# Patient Record
Sex: Female | Born: 1950
Health system: Southern US, Community
[De-identification: ages and names within clinical notes are randomized; demographics above are authoritative.]

## PROBLEM LIST (undated history)

## (undated) DIAGNOSIS — E559 Vitamin D deficiency, unspecified: Secondary | ICD-10-CM

## (undated) DIAGNOSIS — M5126 Other intervertebral disc displacement, lumbar region: Secondary | ICD-10-CM

## (undated) DIAGNOSIS — K219 Gastro-esophageal reflux disease without esophagitis: Secondary | ICD-10-CM

## (undated) DIAGNOSIS — M1990 Unspecified osteoarthritis, unspecified site: Secondary | ICD-10-CM

## (undated) DIAGNOSIS — Z87442 Personal history of urinary calculi: Secondary | ICD-10-CM

## (undated) DIAGNOSIS — E7800 Pure hypercholesterolemia, unspecified: Secondary | ICD-10-CM

## (undated) DIAGNOSIS — R42 Dizziness and giddiness: Secondary | ICD-10-CM

## (undated) DIAGNOSIS — F341 Dysthymic disorder: Secondary | ICD-10-CM

## (undated) DIAGNOSIS — G43909 Migraine, unspecified, not intractable, without status migrainosus: Secondary | ICD-10-CM

## (undated) DIAGNOSIS — M19049 Primary osteoarthritis, unspecified hand: Secondary | ICD-10-CM

## (undated) DIAGNOSIS — M25572 Pain in left ankle and joints of left foot: Secondary | ICD-10-CM

## (undated) DIAGNOSIS — G4700 Insomnia, unspecified: Secondary | ICD-10-CM

## (undated) DIAGNOSIS — M810 Age-related osteoporosis without current pathological fracture: Secondary | ICD-10-CM

## (undated) DIAGNOSIS — F410 Panic disorder [episodic paroxysmal anxiety] without agoraphobia: Secondary | ICD-10-CM

## (undated) HISTORY — PX: TUBAL LIGATION: SHX77

## (undated) HISTORY — DX: Pure hypercholesterolemia, unspecified: E78.00

## (undated) HISTORY — DX: Other intervertebral disc displacement, lumbar region: M51.26

## (undated) HISTORY — DX: Vitamin D deficiency, unspecified: E55.9

## (undated) HISTORY — DX: Primary osteoarthritis, unspecified hand: M19.049

## (undated) HISTORY — DX: Migraine, unspecified, not intractable, without status migrainosus: G43.909

## (undated) HISTORY — PX: OTHER SURGICAL HISTORY: SHX169

## (undated) HISTORY — DX: Insomnia, unspecified: G47.00

## (undated) HISTORY — DX: Gastro-esophageal reflux disease without esophagitis: K21.9

## (undated) HISTORY — PX: TUMOR REMOVAL: SHX12

## (undated) HISTORY — PX: COSMETIC SURGERY: SHX468

## (undated) HISTORY — DX: Panic disorder (episodic paroxysmal anxiety): F41.0

## (undated) HISTORY — PX: EYE SURGERY: SHX253

## (undated) HISTORY — DX: Dysthymic disorder: F34.1

## (undated) HISTORY — PX: LITHOTRIPSY: SUR834

---

## 2007-06-27 ENCOUNTER — Ambulatory Visit: Payer: Self-pay | Admitting: Specialist

## 2010-05-03 ENCOUNTER — Emergency Department: Payer: Self-pay | Admitting: Emergency Medicine

## 2010-12-20 ENCOUNTER — Ambulatory Visit: Payer: Self-pay | Admitting: Gastroenterology

## 2015-11-12 LAB — HM PAP SMEAR

## 2016-01-25 DIAGNOSIS — Z1231 Encounter for screening mammogram for malignant neoplasm of breast: Secondary | ICD-10-CM | POA: Diagnosis not present

## 2016-01-25 LAB — HM MAMMOGRAPHY

## 2016-04-27 DIAGNOSIS — L820 Inflamed seborrheic keratosis: Secondary | ICD-10-CM | POA: Diagnosis not present

## 2016-04-27 DIAGNOSIS — D485 Neoplasm of uncertain behavior of skin: Secondary | ICD-10-CM | POA: Diagnosis not present

## 2016-05-31 DIAGNOSIS — J019 Acute sinusitis, unspecified: Secondary | ICD-10-CM | POA: Diagnosis not present

## 2016-05-31 DIAGNOSIS — R05 Cough: Secondary | ICD-10-CM | POA: Diagnosis not present

## 2016-05-31 DIAGNOSIS — B9689 Other specified bacterial agents as the cause of diseases classified elsewhere: Secondary | ICD-10-CM | POA: Diagnosis not present

## 2016-06-13 DIAGNOSIS — R0981 Nasal congestion: Secondary | ICD-10-CM | POA: Diagnosis not present

## 2016-06-13 DIAGNOSIS — R05 Cough: Secondary | ICD-10-CM | POA: Diagnosis not present

## 2016-06-30 DIAGNOSIS — J301 Allergic rhinitis due to pollen: Secondary | ICD-10-CM | POA: Diagnosis not present

## 2016-06-30 DIAGNOSIS — J019 Acute sinusitis, unspecified: Secondary | ICD-10-CM | POA: Diagnosis not present

## 2016-07-07 DIAGNOSIS — R51 Headache: Secondary | ICD-10-CM | POA: Diagnosis not present

## 2016-07-07 DIAGNOSIS — J310 Chronic rhinitis: Secondary | ICD-10-CM | POA: Diagnosis not present

## 2016-07-20 DIAGNOSIS — H40053 Ocular hypertension, bilateral: Secondary | ICD-10-CM | POA: Diagnosis not present

## 2016-08-04 DIAGNOSIS — G4700 Insomnia, unspecified: Secondary | ICD-10-CM | POA: Insufficient documentation

## 2016-08-04 DIAGNOSIS — E559 Vitamin D deficiency, unspecified: Secondary | ICD-10-CM

## 2016-08-04 DIAGNOSIS — F341 Dysthymic disorder: Secondary | ICD-10-CM

## 2016-08-04 DIAGNOSIS — E7800 Pure hypercholesterolemia, unspecified: Secondary | ICD-10-CM

## 2016-08-04 DIAGNOSIS — G43909 Migraine, unspecified, not intractable, without status migrainosus: Secondary | ICD-10-CM | POA: Insufficient documentation

## 2016-08-04 DIAGNOSIS — K219 Gastro-esophageal reflux disease without esophagitis: Secondary | ICD-10-CM

## 2016-08-04 DIAGNOSIS — M19049 Primary osteoarthritis, unspecified hand: Secondary | ICD-10-CM

## 2016-08-04 DIAGNOSIS — M1990 Unspecified osteoarthritis, unspecified site: Secondary | ICD-10-CM | POA: Insufficient documentation

## 2016-08-04 DIAGNOSIS — F410 Panic disorder [episodic paroxysmal anxiety] without agoraphobia: Secondary | ICD-10-CM

## 2016-08-05 ENCOUNTER — Ambulatory Visit
Admission: RE | Admit: 2016-08-05 | Discharge: 2016-08-05 | Disposition: A | Discharge status: Home / Self Care | Payer: PPO | Source: Ambulatory Visit | Attending: Family Medicine | Admitting: Family Medicine

## 2016-08-05 ENCOUNTER — Ambulatory Visit (INDEPENDENT_AMBULATORY_CARE_PROVIDER_SITE_OTHER): Payer: PPO | Admitting: Family Medicine

## 2016-08-05 ENCOUNTER — Telehealth: Payer: Self-pay

## 2016-08-05 ENCOUNTER — Encounter: Payer: Self-pay | Admitting: Family Medicine

## 2016-08-05 VITALS — BP 102/74 | HR 66 | Temp 98.7°F | Resp 16 | Wt 115.0 lb

## 2016-08-05 DIAGNOSIS — K219 Gastro-esophageal reflux disease without esophagitis: Secondary | ICD-10-CM | POA: Diagnosis not present

## 2016-08-05 DIAGNOSIS — R05 Cough: Secondary | ICD-10-CM

## 2016-08-05 DIAGNOSIS — G2581 Restless legs syndrome: Secondary | ICD-10-CM | POA: Insufficient documentation

## 2016-08-05 DIAGNOSIS — R0602 Shortness of breath: Secondary | ICD-10-CM | POA: Diagnosis not present

## 2016-08-05 DIAGNOSIS — F419 Anxiety disorder, unspecified: Secondary | ICD-10-CM | POA: Insufficient documentation

## 2016-08-05 DIAGNOSIS — R053 Chronic cough: Secondary | ICD-10-CM

## 2016-08-05 NOTE — Telephone Encounter (Signed)
Patient has been advised. KW 

## 2016-08-05 NOTE — Progress Notes (Signed)
Patient: Brooke Mcdowell Female    DOB: 20-Oct-1950   65 y.o.   MRN: [id number] Visit Date: 08/05/2016  Today's Provider: Vernie Murders, PA   Chief Complaint  Patient presents with  . URI   Subjective:    URI   The current episode started more than 1 month ago. The problem has been unchanged. Associated symptoms include congestion, coughing (green sputum), headaches, a plugged ear sensation (bilateral; left worse than right), sinus pain, sneezing and a sore throat. Pertinent negatives include no abdominal pain, chest pain, ear pain, nausea, rhinorrhea, swollen glands, vomiting or wheezing. Treatments tried: Pt has been on cefdinir, 2 rounds of Levaquin, Virtussin cough syrup, Bromphen/pseudo/dextro cough syrup, and Mucinex. Was seen by [facility] walk in clinic and ENT for this. The treatment provided no relief.  Pt is requesting CXR today.  Past Medical History:  Diagnosis Date  . Arthropathy of hand   . Dysthymic disorder   . Esophageal reflux   . Insomnia   . Migraine   . Panic disorder   . Pure hypercholesterolemia   . Vitamin D deficiency    Past Surgical History:  Procedure Laterality Date  . clot removal from right sinus after surgery to remove tumor in 1995    . LITHOTRIPSY  [phone]   renal stones  . TUBAL LIGATION    . TUMOR REMOVAL     behind right ete in sinuses- 1995 Dr. Pryor Ochoa   Family History  Problem Relation Age of Onset  . Brain cancer Mother     Allergies  Allergen Reactions  . Tetracyclines & Related Itching and Nausea And Vomiting  . Penicillins Nausea And Vomiting and Rash  . Sulfa Antibiotics Rash     Current Outpatient Prescriptions:  .  ALPRAZolam (XANAX) 0.25 MG tablet, TK 1 T PO QD PRF ANXIETY OR SLEEP, Disp: , Rfl: 3 .  estradiol (ESTRACE) 1 MG tablet, TK 1 T PO QD, Disp: , Rfl: 8 .  rizatriptan (MAXALT) 10 MG tablet, , Disp: , Rfl: 3 .  sertraline (ZOLOFT) 50 MG tablet, , Disp: , Rfl: 2 .  zolpidem (AMBIEN) 10 MG tablet, TK 1 T  PO QHS PRF SLP, Disp: , Rfl: 3 .  FLUAD 0.5 ML SUSY, ADM 0.5ML IM UTD, Disp: , Rfl: 0  Review of Systems  HENT: Positive for congestion, sneezing and sore throat. Negative for ear pain and rhinorrhea.   Respiratory: Positive for cough (green sputum). Negative for wheezing.   Cardiovascular: Negative for chest pain.  Gastrointestinal: Negative for abdominal pain, nausea and vomiting.  Neurological: Positive for headaches.    Social History  Substance Use Topics  . Smoking status: Never Smoker  . Smokeless tobacco: Never Used  . Alcohol use Yes     Comment: rarely   Objective:   BP 102/74 (BP Location: Right Arm, Patient Position: Sitting, Cuff Size: Normal)   Pulse 66   Temp 98.7 F (37.1 C) (Oral)   Resp 16   Wt 115 lb (52.2 kg)   SpO2 98%   Physical Exam  Constitutional: She is oriented to person, place, and time. She appears well-developed and well-nourished. No distress.  HENT:  Head: Normocephalic and atraumatic.  Right Ear: Hearing and external ear normal.  Left Ear: Hearing and external ear normal.  Nose: Nose normal.  Eyes: Conjunctivae and lids are normal. Right eye exhibits no discharge. Left eye exhibits no discharge. No scleral icterus.  Neck: Neck supple.  Cardiovascular: Normal  rate and regular rhythm.   Pulmonary/Chest: Effort normal and breath sounds normal. No respiratory distress. She has no wheezes. She has no rales.  Abdominal: Soft. Bowel sounds are normal.  Musculoskeletal: Normal range of motion.  Neurological: She is alert and oriented to person, place, and time.  Skin: Skin is intact. No lesion and no rash noted.  Psychiatric: She has a normal mood and affect. Her speech is normal and behavior is normal. Thought content normal.      Assessment & Plan:     1. Persistent cough Started with sinus congestion. Was treated on 05-31-16 with Levaquin for 10 days. Was rechecked at a walk-in clinic since she had no improvement 06-13-16 and was given 10 more  days of Levaquin with addition of a prednisone taper. Felt she was not improving and went to Dr. Richardson Landry (ENT) with fever and greenish sputum production. States a CT scan of sinuses in early October was negative and allergy testing ([facility]ST) was negative. Was advised to take [facility] but has continued to have a cough. Worried about lung cancer. Recommend CXR, use Mucinex and start Zantac for possible reflux irritation. Recheck pending reports. - DG Chest 2 View - CBC with Differential/Platelet - Comprehensive metabolic panel  2. Gastroesophageal reflux disease, esophagitis presence not specified Has had problems with reflux in the past but no dyspepsia, melena or hematemesis recently. Will check labs and encouraged to use Zantac 150 mg BID for a month. May need referral to GI and possible endoscopy if no better. - CBC with Differential/Platelet - Comprehensive metabolic panel       Vernie Murders, PA  [facility]

## 2016-08-05 NOTE — Telephone Encounter (Signed)
-----   Message from Margo Common, [place] sent at 08/05/2016 12:19 PM EDT ----- Normal CXR. No sign of infection, cancer or heart disease. Proceed with Zantac 150 mg BID and Mucinex-DM BID. Recheck lungs in 2 weeks to see if she needs referral for upper endoscopy.

## 2016-08-06 LAB — COMPREHENSIVE METABOLIC PANEL
A/G RATIO: 1.7 (ref 1.2–2.2)
ALT: 12 IU/L (ref 0–32)
AST: 17 IU/L (ref 0–40)
Albumin: 4.3 g/dL (ref 3.6–4.8)
Alkaline Phosphatase: 57 IU/L (ref 39–117)
BUN/Creatinine Ratio: 16 (ref 12–28)
BUN: 13 mg/dL (ref 8–27)
Bilirubin Total: 0.4 mg/dL (ref 0.0–1.2)
CO2: 25 mmol/L (ref 18–29)
CREATININE: 0.79 mg/dL (ref 0.57–1.00)
Calcium: 9.5 mg/dL (ref 8.7–10.3)
Chloride: 103 mmol/L (ref 96–106)
GFR, EST AFRICAN AMERICAN: 90 mL/min/{1.73_m2} (ref >59)
GFR, EST NON AFRICAN AMERICAN: 78 mL/min/{1.73_m2} (ref >59)
GLUCOSE: 91 mg/dL (ref 65–99)
Globulin, Total: 2.5 g/dL (ref 1.5–4.5)
POTASSIUM: 5.1 mmol/L (ref 3.5–5.2)
Sodium: 140 mmol/L (ref 134–144)
TOTAL PROTEIN: 6.8 g/dL (ref 6.0–8.5)

## 2016-08-06 LAB — CBC WITH DIFFERENTIAL/PLATELET
BASOS ABS: 0 10*3/uL (ref 0.0–0.2)
Basos: 0 %
EOS (ABSOLUTE): 0 10*3/uL (ref 0.0–0.4)
Eos: 1 %
HEMOGLOBIN: 13.2 g/dL (ref 11.1–15.9)
Hematocrit: 38.7 % (ref 34.0–46.6)
Immature Grans (Abs): 0 10*3/uL (ref 0.0–0.1)
Immature Granulocytes: 0 %
LYMPHS ABS: 1.7 10*3/uL (ref 0.7–3.1)
Lymphs: 32 %
MCH: 31 pg (ref 26.6–33.0)
MCHC: 34.1 g/dL (ref 31.5–35.7)
MCV: 91 fL (ref 79–97)
MONOCYTES: 10 %
MONOS ABS: 0.5 10*3/uL (ref 0.1–0.9)
NEUTROS ABS: 3 10*3/uL (ref 1.4–7.0)
Neutrophils: 57 %
Platelets: 264 10*3/uL (ref 150–379)
RBC: 4.26 x10E6/uL (ref 3.77–5.28)
RDW: 12.9 % (ref 12.3–15.4)
WBC: 5.2 10*3/uL (ref 3.4–10.8)

## 2016-08-08 ENCOUNTER — Telehealth: Payer: Self-pay

## 2016-08-08 NOTE — Telephone Encounter (Signed)
Patient has been advised an appt set up for 12/1

## 2016-08-08 NOTE — Telephone Encounter (Signed)
-----   Message from Margo Common, [place] sent at 08/08/2016  9:57 AM EST ----- Normal chest x-ray report without any infection or sign of fluid collection. Blood tests are all normal. Continue Zantac 150 mg BID for 4 weeks and schedule follow up appointment if no better.

## 2016-08-19 ENCOUNTER — Ambulatory Visit: Payer: PPO | Admitting: Family Medicine

## 2016-08-19 ENCOUNTER — Ambulatory Visit: Payer: Self-pay | Admitting: Family Medicine

## 2016-09-02 ENCOUNTER — Ambulatory Visit: Payer: Self-pay | Admitting: Family Medicine

## 2016-09-19 DIAGNOSIS — D225 Melanocytic nevi of trunk: Secondary | ICD-10-CM | POA: Diagnosis not present

## 2016-09-19 DIAGNOSIS — Z85828 Personal history of other malignant neoplasm of skin: Secondary | ICD-10-CM | POA: Diagnosis not present

## 2016-09-19 DIAGNOSIS — L814 Other melanin hyperpigmentation: Secondary | ICD-10-CM | POA: Diagnosis not present

## 2016-09-19 DIAGNOSIS — L821 Other seborrheic keratosis: Secondary | ICD-10-CM | POA: Diagnosis not present

## 2016-10-11 DIAGNOSIS — M5440 Lumbago with sciatica, unspecified side: Secondary | ICD-10-CM | POA: Diagnosis not present

## 2016-10-11 DIAGNOSIS — M5136 Other intervertebral disc degeneration, lumbar region: Secondary | ICD-10-CM | POA: Diagnosis not present

## 2016-10-13 DIAGNOSIS — M5442 Lumbago with sciatica, left side: Secondary | ICD-10-CM | POA: Diagnosis not present

## 2016-10-13 DIAGNOSIS — G8929 Other chronic pain: Secondary | ICD-10-CM | POA: Diagnosis not present

## 2016-10-13 DIAGNOSIS — M6281 Muscle weakness (generalized): Secondary | ICD-10-CM | POA: Diagnosis not present

## 2016-10-13 DIAGNOSIS — M5441 Lumbago with sciatica, right side: Secondary | ICD-10-CM | POA: Diagnosis not present

## 2016-10-17 DIAGNOSIS — M5442 Lumbago with sciatica, left side: Secondary | ICD-10-CM | POA: Diagnosis not present

## 2016-10-17 DIAGNOSIS — G8929 Other chronic pain: Secondary | ICD-10-CM | POA: Diagnosis not present

## 2016-10-17 DIAGNOSIS — M6281 Muscle weakness (generalized): Secondary | ICD-10-CM | POA: Diagnosis not present

## 2016-10-17 DIAGNOSIS — M5441 Lumbago with sciatica, right side: Secondary | ICD-10-CM | POA: Diagnosis not present

## 2016-10-21 DIAGNOSIS — G8929 Other chronic pain: Secondary | ICD-10-CM | POA: Diagnosis not present

## 2016-10-21 DIAGNOSIS — M6281 Muscle weakness (generalized): Secondary | ICD-10-CM | POA: Diagnosis not present

## 2016-10-21 DIAGNOSIS — M5442 Lumbago with sciatica, left side: Secondary | ICD-10-CM | POA: Diagnosis not present

## 2016-10-21 DIAGNOSIS — M5441 Lumbago with sciatica, right side: Secondary | ICD-10-CM | POA: Diagnosis not present

## 2016-10-24 DIAGNOSIS — M6281 Muscle weakness (generalized): Secondary | ICD-10-CM | POA: Diagnosis not present

## 2016-10-24 DIAGNOSIS — G8929 Other chronic pain: Secondary | ICD-10-CM | POA: Diagnosis not present

## 2016-10-24 DIAGNOSIS — M5441 Lumbago with sciatica, right side: Secondary | ICD-10-CM | POA: Diagnosis not present

## 2016-10-24 DIAGNOSIS — M5442 Lumbago with sciatica, left side: Secondary | ICD-10-CM | POA: Diagnosis not present

## 2016-10-28 DIAGNOSIS — M5442 Lumbago with sciatica, left side: Secondary | ICD-10-CM | POA: Diagnosis not present

## 2016-10-28 DIAGNOSIS — M6281 Muscle weakness (generalized): Secondary | ICD-10-CM | POA: Diagnosis not present

## 2016-10-28 DIAGNOSIS — M5441 Lumbago with sciatica, right side: Secondary | ICD-10-CM | POA: Diagnosis not present

## 2016-10-28 DIAGNOSIS — G8929 Other chronic pain: Secondary | ICD-10-CM | POA: Diagnosis not present

## 2016-11-01 DIAGNOSIS — M5136 Other intervertebral disc degeneration, lumbar region: Secondary | ICD-10-CM | POA: Diagnosis not present

## 2016-11-01 DIAGNOSIS — M5440 Lumbago with sciatica, unspecified side: Secondary | ICD-10-CM | POA: Diagnosis not present

## 2016-11-29 DIAGNOSIS — Z1231 Encounter for screening mammogram for malignant neoplasm of breast: Secondary | ICD-10-CM | POA: Diagnosis not present

## 2016-11-29 DIAGNOSIS — Z124 Encounter for screening for malignant neoplasm of cervix: Secondary | ICD-10-CM | POA: Diagnosis not present

## 2016-12-01 ENCOUNTER — Other Ambulatory Visit: Payer: Self-pay | Admitting: Unknown Physician Specialty

## 2016-12-01 DIAGNOSIS — M545 Low back pain: Principal | ICD-10-CM

## 2016-12-01 DIAGNOSIS — G8929 Other chronic pain: Secondary | ICD-10-CM

## 2016-12-14 ENCOUNTER — Ambulatory Visit
Admission: RE | Admit: 2016-12-14 | Discharge: 2016-12-14 | Disposition: A | Discharge status: Home / Self Care | Payer: PPO | Source: Ambulatory Visit | Attending: Unknown Physician Specialty | Admitting: Unknown Physician Specialty

## 2016-12-14 ENCOUNTER — Ambulatory Visit: Payer: PPO

## 2016-12-14 DIAGNOSIS — M545 Low back pain, unspecified: Secondary | ICD-10-CM

## 2016-12-14 DIAGNOSIS — M5126 Other intervertebral disc displacement, lumbar region: Secondary | ICD-10-CM | POA: Insufficient documentation

## 2016-12-14 DIAGNOSIS — G8929 Other chronic pain: Secondary | ICD-10-CM | POA: Insufficient documentation

## 2016-12-14 DIAGNOSIS — M48061 Spinal stenosis, lumbar region without neurogenic claudication: Secondary | ICD-10-CM | POA: Diagnosis not present

## 2016-12-26 ENCOUNTER — Ambulatory Visit: Payer: PPO

## 2017-01-12 DIAGNOSIS — M48062 Spinal stenosis, lumbar region with neurogenic claudication: Secondary | ICD-10-CM | POA: Diagnosis not present

## 2017-01-12 DIAGNOSIS — M5136 Other intervertebral disc degeneration, lumbar region: Secondary | ICD-10-CM | POA: Diagnosis not present

## 2017-01-12 DIAGNOSIS — M5416 Radiculopathy, lumbar region: Secondary | ICD-10-CM | POA: Diagnosis not present

## 2017-02-14 DIAGNOSIS — M5136 Other intervertebral disc degeneration, lumbar region: Secondary | ICD-10-CM | POA: Diagnosis not present

## 2017-02-14 DIAGNOSIS — M48062 Spinal stenosis, lumbar region with neurogenic claudication: Secondary | ICD-10-CM | POA: Diagnosis not present

## 2017-02-14 DIAGNOSIS — M5416 Radiculopathy, lumbar region: Secondary | ICD-10-CM | POA: Diagnosis not present

## 2017-04-11 DIAGNOSIS — M47816 Spondylosis without myelopathy or radiculopathy, lumbar region: Secondary | ICD-10-CM | POA: Diagnosis not present

## 2017-04-25 ENCOUNTER — Encounter: Payer: Self-pay | Admitting: Physician Assistant

## 2017-04-25 ENCOUNTER — Ambulatory Visit (INDEPENDENT_AMBULATORY_CARE_PROVIDER_SITE_OTHER): Payer: PPO | Admitting: Physician Assistant

## 2017-04-25 VITALS — BP 116/78 | HR 80 | Temp 98.8°F | Resp 16 | Wt 112.0 lb

## 2017-04-25 DIAGNOSIS — J019 Acute sinusitis, unspecified: Secondary | ICD-10-CM | POA: Diagnosis not present

## 2017-04-25 MED ORDER — CEFDINIR 300 MG PO CAPS: 300.0000 mg | ORAL_CAPSULE | Freq: Two times a day (BID) | ORAL | 0 refills | Status: AC

## 2017-04-25 NOTE — Progress Notes (Signed)
Patient: Brooke Mcdowell Female    DOB: [birth year]   66 y.o.   MRN: [id number] Visit Date: 04/25/2017  Today's Provider: Trinna Post, PA-C   Chief Complaint  Patient presents with  . Sinusitis    Started about three weeks ago.   Subjective:      Brooke Mcdowell is a 66 y/o woman with history of recurrent sinusitis and 2 sinuplasty surgeries presenting today with sinus pain and congestion + PND drip x 3 weeks. Pain located around her nasal bridge, she has a headache and pressure. Having a lot of hardened nasal secretions. Had some URI symptoms a few weeks previously. Low grade temp. No trouble breathing, wheezing. She has allergy listed to PCN but tolerated cefidnir 2 years prior.  She is also asking if she can be seen here for her physicals. She had her wellness exams done by OBGYN but she is now 57 and done with PAPs. She is on estrace and progesterone for HRT, takes Xanax PRN, takes Ambien for sleep after failing melatonin and trazadone. Takes Maxalt PRN for migraines.  Sinusitis  This is a new problem. The current episode started 1 to 4 weeks ago. The problem has been gradually worsening since onset. Maximum temperature: Pt reports "A low grade fever of 99.2 and 99.4. Associated symptoms include congestion, coughing, headaches, sinus pressure, sneezing and a sore throat. Pertinent negatives include no chills, diaphoresis, ear pain, neck pain, shortness of breath or swollen glands. Past treatments include acetaminophen and oral decongestants. The treatment provided no relief.       Allergies  Allergen Reactions  . Tetracyclines & Related Itching and Nausea And Vomiting  . Penicillins Nausea And Vomiting and Rash  . Sulfa Antibiotics Rash     Current Outpatient Prescriptions:  .  ALPRAZolam (XANAX) 0.25 MG tablet, TK 1 T PO QD PRF ANXIETY OR SLEEP, Disp: , Rfl: 3 .  estradiol (ESTRACE) 1 MG tablet, TK 1 T PO QD, Disp: , Rfl: 8 .  rizatriptan (MAXALT) 10 MG tablet, ,  Disp: , Rfl: 3 .  sertraline (ZOLOFT) 50 MG tablet, , Disp: , Rfl: 2 .  zolpidem (AMBIEN) 10 MG tablet, TK 1 T PO QHS PRF SLP, Disp: , Rfl: 3 .  BOOSTRIX 5-2.5-18.5 injection, ADM 0.5ML IM UTD, Disp: , Rfl: 0  Review of Systems  Constitutional: Positive for activity change and fatigue. Negative for appetite change, chills, diaphoresis, fever and unexpected weight change.  HENT: Positive for congestion, nosebleeds, postnasal drip, sinus pain, sinus pressure, sneezing and sore throat. Negative for ear discharge, ear pain, hearing loss, rhinorrhea, tinnitus, trouble swallowing and voice change.   Eyes: Positive for pain and discharge. Negative for photophobia, redness, itching and visual disturbance.  Respiratory: Positive for cough and chest tightness. Negative for apnea, choking, shortness of breath, wheezing and stridor.   Gastrointestinal: Positive for abdominal pain and constipation. Negative for abdominal distention, anal bleeding, blood in stool, diarrhea, nausea, rectal pain and vomiting.  Musculoskeletal: Negative for neck pain and neck stiffness.  Neurological: Positive for headaches. Negative for dizziness and light-headedness.    Social History  Substance Use Topics  . Smoking status: Never Smoker  . Smokeless tobacco: Never Used  . Alcohol use Yes     Comment: rarely   Objective:   BP 116/78 (BP Location: Right Arm, Patient Position: Sitting, Cuff Size: Normal)   Pulse 80   Temp 98.8 F (37.1 C) (Oral)   Resp 16  Wt 112 lb (50.8 kg)   SpO2 95%  Vitals:   04/25/17 1541  BP: 116/78  Pulse: 80  Resp: 16  Temp: 98.8 F (37.1 C)  TempSrc: Oral  SpO2: 95%  Weight: 112 lb (50.8 kg)     Physical Exam  Constitutional: She is oriented to person, place, and time. She appears well-developed and well-nourished. No distress.  HENT:  Right Ear: External ear normal.  Left Ear: External ear normal.  Nose: Right sinus exhibits maxillary sinus tenderness and frontal sinus  tenderness. Left sinus exhibits maxillary sinus tenderness and frontal sinus tenderness.  Mouth/Throat: Oropharynx is clear and moist. No oropharyngeal exudate, posterior oropharyngeal edema or posterior oropharyngeal erythema.  Tms opaque bilaterally   Eyes: Conjunctivae are normal. Right eye exhibits no discharge. Left eye exhibits no discharge.  Neck: Neck supple.  Cardiovascular: Normal rate and regular rhythm.   Pulmonary/Chest: Effort normal and breath sounds normal. No respiratory distress. She has no wheezes. She has no rales.  Lymphadenopathy:    She has cervical adenopathy.  Neurological: She is alert and oriented to person, place, and time.  Skin: Skin is warm and dry. She is not diaphoretic.  Psychiatric: She has a normal mood and affect. Her behavior is normal.        Assessment & Plan:     1. Acute sinusitis, recurrence not specified, unspecified location  Let patient know it would be fine for her to be seen here for her physicals.  - cefdinir (OMNICEF) 300 MG capsule; Take 1 capsule (300 mg total) by mouth 2 (two) times daily.  Dispense: 20 capsule; Refill: 0  Return if symptoms worsen or fail to improve.  The entirety of the information documented in the History of Present Illness, Review of Systems and Physical Exam were personally obtained by me. Portions of this information were initially documented by Ashley Royalty, CMA and reviewed by me for thoroughness and accuracy.        Trinna Post, PA-C  [facility]

## 2017-04-25 NOTE — Patient Instructions (Signed)

## 2017-05-22 DIAGNOSIS — M47816 Spondylosis without myelopathy or radiculopathy, lumbar region: Secondary | ICD-10-CM | POA: Diagnosis not present

## 2017-06-13 ENCOUNTER — Ambulatory Visit (INDEPENDENT_AMBULATORY_CARE_PROVIDER_SITE_OTHER): Payer: PPO

## 2017-06-13 VITALS — BP 112/72 | HR 64 | Temp 98.8°F | Ht 66.0 in | Wt 114.0 lb

## 2017-06-13 DIAGNOSIS — Z23 Encounter for immunization: Secondary | ICD-10-CM

## 2017-06-13 DIAGNOSIS — Z0000 Encounter for general adult medical examination without abnormal findings: Secondary | ICD-10-CM | POA: Diagnosis not present

## 2017-06-13 NOTE — Progress Notes (Signed)
Subjective:   Brooke Mcdowell is a 66 y.o. female who presents for Medicare Annual (Subsequent) preventive examination.  Review of Systems:  N/A  Cardiac Risk Factors include: advanced age (>45men, >6 women);dyslipidemia     Objective:     Vitals: BP 112/72 (BP Location: Left Arm)   Pulse 64   Temp 98.8 F (37.1 C) (Oral)   Ht 5\' 6"  (1.676 m)   Wt 114 lb (51.7 kg)   BMI 18.40 kg/m   Body mass index is 18.4 kg/m.   Tobacco History  Smoking Status  . Never Smoker  Smokeless Tobacco  . Never Used     Counseling given: Not Answered   Past Medical History:  Diagnosis Date  . Arthropathy of hand   . Dysthymic disorder   . Esophageal reflux   . Insomnia   . Lumbar herniated disc   . Migraine   . Panic disorder   . Pure hypercholesterolemia   . Vitamin D deficiency    Past Surgical History:  Procedure Laterality Date  . clot removal from right sinus after surgery to remove tumor in 1995    . LITHOTRIPSY  [phone]   renal stones  . TUBAL LIGATION    . TUMOR REMOVAL     behind right ete in sinuses- 1995 Dr. Pryor Ochoa   Family History  Problem Relation Age of Onset  . Brain cancer Mother   . Cancer Mother        lung  . Cancer Father        lung   . Brain cancer Father    History  Sexual Activity  . Sexual activity: Not on file    Outpatient Encounter Prescriptions as of 06/13/2017  Medication Sig  . azelastine (ASTELIN) 0.1 % nasal spray Place 1 spray into both nostrils 2 (two) times daily. Use in each nostril as directed  . estradiol (ESTRACE) 1 MG tablet TK 1 T PO QD  . progesterone (PROMETRIUM) 100 MG capsule Take 50 mg by mouth daily.  . rizatriptan (MAXALT) 10 MG tablet Take 10 mg by mouth as needed.   . sertraline (ZOLOFT) 50 MG tablet Take 50 mg by mouth daily.   Marland Kitchen zolpidem (AMBIEN) 10 MG tablet TK 1 T PO QHS PRF SLP  . ALPRAZolam (XANAX) 0.25 MG tablet TK 1 T PO QD PRF ANXIETY OR SLEEP  . [DISCONTINUED] BOOSTRIX 5-2.5-18.5 injection ADM  0.5ML IM UTD   No facility-administered encounter medications on file as of 06/13/2017.     Activities of Daily Living In your present state of health, do you have any difficulty performing the following activities: 06/13/2017  Hearing? N  Vision? N  Difficulty concentrating or making decisions? N  Walking or climbing stairs? Y  Comment pain due to herniated disc  Dressing or bathing? N  Doing errands, shopping? N  Preparing Food and eating ? N  Using the Toilet? N  In the past six months, have you accidently leaked urine? N  Do you have problems with loss of bowel control? N  Managing your Medications? N  Managing your Finances? N  Housekeeping or managing your Housekeeping? N  Some recent data might be hidden    Patient Care Team: Chrismon, Vickki Muff, PA as PCP - General (Family Medicine) Karren Burly Deirdre Peer, MD as Referring Physician (Ophthalmology) Sharlet Salina, MD as Referring Physician (Physical Medicine and Rehabilitation) Catheryn Bacon, CNM as Midwife (Obstetrics and Gynecology)    Assessment:     Exercise  Activities and Dietary recommendations Current Exercise Habits: Structured exercise class, Type of exercise: strength training/weights;treadmill;walking, Time (Minutes): 60 (or more), Frequency (Times/Week): 3, Weekly Exercise (Minutes/Week): 180, Intensity: Mild  Goals    . Increase water intake          Recommend increasing water intake to 4-6 glasses a day.       Fall Risk Fall Risk  06/13/2017  Falls in the past year? No   Depression Screen PHQ 2/9 Scores 06/13/2017 06/13/2017  PHQ - 2 Score 0 0  PHQ- 9 Score 2 -     Cognitive Function     6CIT Screen 06/13/2017  What Year? 0 points  What month? 0 points  What time? 0 points  Count back from 20 0 points  Months in reverse 0 points  Repeat phrase 4 points  Total Score 4    Immunization History  Administered Date(s) Administered  . Pneumococcal Conjugate-13 06/13/2017   Screening  Tests Health Maintenance  Topic Date Due  . DEXA SCAN  11/29/2014  . INFLUENZA VACCINE  05/03/2017  . MAMMOGRAM  01/24/2018  . PNA vac Low Risk Adult (2 of 2 - PPSV23) 06/13/2018  . COLONOSCOPY  12/19/2020  . TETANUS/TDAP  12/02/2026  . Hepatitis C Screening  Completed      Plan:  I have personally reviewed and addressed the Medicare Annual Wellness questionnaire and have noted the following in the patient's chart:  A. Medical and social history B. Use of alcohol, tobacco or illicit drugs  C. Current medications and supplements D. Functional ability and status E.  Nutritional status F.  Physical activity G. Advance directives H. List of other physicians I.  Hospitalizations, surgeries, and ER visits in previous 12 months J.  [place] such as hearing and vision if needed, cognitive and depression L. Referrals and appointments - none  In addition, I have reviewed and discussed with patient certain preventive protocols, quality metrics, and best practice recommendations. A written personalized care plan for preventive services as well as general preventive health recommendations were provided to patient.  See attached scanned questionnaire for additional information.   Signed,  Fabio Neighbors, LPN Nurse Health Advisor   MD Recommendations: Follow up on influenza vaccine. Pt declined today and would like to wait further into fall of 2018. Pt also is wanting to wait on DEXA referral.   I havre reviewed the Nurse Health Advisor's note and was available for consultation. Agree with documentation and plan.

## 2017-06-13 NOTE — Patient Instructions (Signed)
Brooke Mcdowell , Thank you for taking time to come for your Medicare Wellness Visit. I appreciate your ongoing commitment to your health goals. Please review the following plan we discussed and let me know if I can assist you in the future.   Screening recommendations/referrals: Colonoscopy: up to date Mammogram: up to date Bone Density: up to date Recommended yearly ophthalmology/optometry visit for glaucoma screening and checkup Recommended yearly dental visit for hygiene and checkup  Vaccinations: Influenza vaccine: declined today Pneumococcal vaccine: completed series Tdap vaccine: up to date Shingles vaccine: completed per pt  Advanced directives: Please bring a copy of your POA (Power of Attorney) and/or Living Will to your next appointment.   Conditions/risks identified: Recommend increasing water intake to 4-6 glasses a day.   Next appointment: 09/05/17 @ 10:30 AM   Preventive Care 65 Years and Older, Female Preventive care refers to lifestyle choices and visits with your health care provider that can promote health and wellness. What does preventive care include?  A yearly physical exam. This is also called an annual well check.  Dental exams once or twice a year.  Routine eye exams. Ask your health care provider how often you should have your eyes checked.  Personal lifestyle choices, including:  Daily care of your teeth and gums.  Regular physical activity.  Eating a healthy diet.  Avoiding tobacco and drug use.  Limiting alcohol use.  Practicing safe sex.  Taking low-dose aspirin every day.  Taking vitamin and mineral supplements as recommended by your health care provider. What happens during an annual well check? The services and screenings done by your health care provider during your annual well check will depend on your age, overall health, lifestyle risk factors, and family history of disease. Counseling  Your health care provider may ask you questions  about your:  Alcohol use.  Tobacco use.  Drug use.  Emotional well-being.  Home and relationship well-being.  Sexual activity.  Eating habits.  History of falls.  Memory and ability to understand (cognition).  Work and work Statistician.  Reproductive health. Screening  You may have the following tests or measurements:  Height, weight, and BMI.  Blood pressure.  Lipid and cholesterol levels. These may be checked every 5 years, or more frequently if you are over 70 years old.  Skin check.  Lung cancer screening. You may have this screening every year starting at age 107 if you have a 30-pack-year history of smoking and currently smoke or have quit within the past 15 years.  Fecal occult blood test (FOBT) of the stool. You may have this test every year starting at age 76.  Flexible sigmoidoscopy or colonoscopy. You may have a sigmoidoscopy every 5 years or a colonoscopy every 10 years starting at age 88.  Hepatitis C blood test.  Hepatitis B blood test.  Sexually transmitted disease (STD) testing.  Diabetes screening. This is done by checking your blood sugar (glucose) after you have not eaten for a while (fasting). You may have this done every 1-3 years.  Bone density scan. This is done to screen for osteoporosis. You may have this done starting at age 27.  Mammogram. This may be done every 1-2 years. Talk to your health care provider about how often you should have regular mammograms. Talk with your health care provider about your test results, treatment options, and if necessary, the need for more tests. Vaccines  Your health care provider may recommend certain vaccines, such as:  Influenza vaccine. This  is recommended every year.  Tetanus, diphtheria, and acellular pertussis (Tdap, Td) vaccine. You may need a Td booster every 10 years.  Zoster vaccine. You may need this after age 43.  Pneumococcal 13-valent conjugate (PCV13) vaccine. One dose is  recommended after age 69.  Pneumococcal polysaccharide (PPSV23) vaccine. One dose is recommended after age 58. Talk to your health care provider about which screenings and vaccines you need and how often you need them. This information is not intended to replace advice given to you by your health care provider. Make sure you discuss any questions you have with your health care provider. Document Released: 10/16/2015 Document Revised: 06/08/2016 Document Reviewed: 07/21/2015 Elsevier Interactive Patient Education  2017 [place] Prevention in the Home Falls can cause injuries. They can happen to people of all ages. There are many things you can do to make your home safe and to help prevent falls. What can I do on the outside of my home?  Regularly fix the edges of walkways and driveways and fix any cracks.  Remove anything that might make you trip as you walk through a door, such as a raised step or threshold.  Trim any bushes or trees on the path to your home.  Use bright outdoor lighting.  Clear any walking paths of anything that might make someone trip, such as rocks or tools.  Regularly check to see if handrails are loose or broken. Make sure that both sides of any steps have handrails.  Any raised decks and porches should have guardrails on the edges.  Have any leaves, snow, or ice cleared regularly.  Use sand or salt on walking paths during winter.  Clean up any spills in your garage right away. This includes oil or grease spills. What can I do in the bathroom?  Use night lights.  Install grab bars by the toilet and in the tub and shower. Do not use towel bars as grab bars.  Use non-skid mats or decals in the tub or shower.  If you need to sit down in the shower, use a plastic, non-slip stool.  Keep the floor dry. Clean up any water that spills on the floor as soon as it happens.  Remove soap buildup in the tub or shower regularly.  Attach bath mats  securely with double-sided non-slip rug tape.  Do not have throw rugs and other things on the floor that can make you trip. What can I do in the bedroom?  Use night lights.  Make sure that you have a light by your bed that is easy to reach.  Do not use any sheets or blankets that are too big for your bed. They should not hang down onto the floor.  Have a firm chair that has side arms. You can use this for support while you get dressed.  Do not have throw rugs and other things on the floor that can make you trip. What can I do in the kitchen?  Clean up any spills right away.  Avoid walking on wet floors.  Keep items that you use a lot in easy-to-reach places.  If you need to reach something above you, use a strong step stool that has a grab bar.  Keep electrical cords out of the way.  Do not use floor polish or wax that makes floors slippery. If you must use wax, use non-skid floor wax.  Do not have throw rugs and other things on the floor that can make you  trip. What can I do with my stairs?  Do not leave any items on the stairs.  Make sure that there are handrails on both sides of the stairs and use them. Fix handrails that are broken or loose. Make sure that handrails are as long as the stairways.  Check any carpeting to make sure that it is firmly attached to the stairs. Fix any carpet that is loose or worn.  Avoid having throw rugs at the top or bottom of the stairs. If you do have throw rugs, attach them to the floor with carpet tape.  Make sure that you have a light switch at the top of the stairs and the bottom of the stairs. If you do not have them, ask someone to add them for you. What else can I do to help prevent falls?  Wear shoes that:  Do not have high heels.  Have rubber bottoms.  Are comfortable and fit you well.  Are closed at the toe. Do not wear sandals.  If you use a stepladder:  Make sure that it is fully opened. Do not climb a closed  stepladder.  Make sure that both sides of the stepladder are locked into place.  Ask someone to hold it for you, if possible.  Clearly mark and make sure that you can see:  Any grab bars or handrails.  First and last steps.  Where the edge of each step is.  Use tools that help you move around (mobility aids) if they are needed. These include:  Canes.  Walkers.  Scooters.  Crutches.  Turn on the lights when you go into a dark area. Replace any light bulbs as soon as they burn out.  Set up your furniture so you have a clear path. Avoid moving your furniture around.  If any of your floors are uneven, fix them.  If there are any pets around you, be aware of where they are.  Review your medicines with your doctor. Some medicines can make you feel dizzy. This can increase your chance of falling. Ask your doctor what other things that you can do to help prevent falls. This information is not intended to replace advice given to you by your health care provider. Make sure you discuss any questions you have with your health care provider. Document Released: 07/16/2009 Document Revised: 02/25/2016 Document Reviewed: 10/24/2014 Elsevier Interactive Patient Education  2017 [employer].

## 2017-06-21 DIAGNOSIS — J019 Acute sinusitis, unspecified: Secondary | ICD-10-CM | POA: Diagnosis not present

## 2017-06-21 DIAGNOSIS — J301 Allergic rhinitis due to pollen: Secondary | ICD-10-CM | POA: Diagnosis not present

## 2017-06-27 DIAGNOSIS — M47816 Spondylosis without myelopathy or radiculopathy, lumbar region: Secondary | ICD-10-CM | POA: Diagnosis not present

## 2017-07-10 DIAGNOSIS — J310 Chronic rhinitis: Secondary | ICD-10-CM | POA: Diagnosis not present

## 2017-07-10 DIAGNOSIS — R05 Cough: Secondary | ICD-10-CM | POA: Diagnosis not present

## 2017-07-17 ENCOUNTER — Other Ambulatory Visit: Payer: Self-pay | Admitting: Otolaryngology

## 2017-07-17 ENCOUNTER — Ambulatory Visit (INDEPENDENT_AMBULATORY_CARE_PROVIDER_SITE_OTHER): Payer: PPO | Admitting: Family Medicine

## 2017-07-17 ENCOUNTER — Ambulatory Visit: Payer: PPO | Admitting: Family Medicine

## 2017-07-17 ENCOUNTER — Ambulatory Visit
Admission: RE | Admit: 2017-07-17 | Discharge: 2017-07-17 | Disposition: A | Discharge status: Home / Self Care | Payer: PPO | Source: Ambulatory Visit | Attending: Otolaryngology | Admitting: Otolaryngology

## 2017-07-17 ENCOUNTER — Encounter: Payer: Self-pay | Admitting: Family Medicine

## 2017-07-17 VITALS — BP 120/72 | HR 83 | Temp 99.3°F | Resp 17 | Wt 113.6 lb

## 2017-07-17 DIAGNOSIS — R05 Cough: Secondary | ICD-10-CM

## 2017-07-17 DIAGNOSIS — R059 Cough, unspecified: Secondary | ICD-10-CM

## 2017-07-17 DIAGNOSIS — R053 Chronic cough: Secondary | ICD-10-CM

## 2017-07-17 DIAGNOSIS — K219 Gastro-esophageal reflux disease without esophagitis: Secondary | ICD-10-CM | POA: Diagnosis not present

## 2017-07-17 LAB — CBC WITH DIFFERENTIAL/PLATELET
BASOS PCT: 0.4 %
Basophils Absolute: 30 cells/uL (ref 0–200)
EOS PCT: 2.8 %
Eosinophils Absolute: 213 cells/uL (ref 15–500)
HEMATOCRIT: 39.9 % (ref 35.0–45.0)
Hemoglobin: 13.6 g/dL (ref 11.7–15.5)
LYMPHS ABS: 1588 {cells}/uL (ref 850–3900)
MCH: 31 pg (ref 27.0–33.0)
MCHC: 34.1 g/dL (ref 32.0–36.0)
MCV: 90.9 fL (ref 80.0–100.0)
MONOS PCT: 11.2 %
MPV: 10.7 fL (ref 7.5–12.5)
NEUTROS PCT: 64.7 %
Neutro Abs: 4917 cells/uL (ref 1500–7800)
Platelets: 292 10*3/uL (ref 140–400)
RBC: 4.39 10*6/uL (ref 3.80–5.10)
RDW: 11.9 % (ref 11.0–15.0)
TOTAL LYMPHOCYTE: 20.9 %
WBC mixed population: 851 cells/uL (ref 200–950)
WBC: 7.6 10*3/uL (ref 3.8–10.8)

## 2017-07-17 MED ORDER — SUCRALFATE 1 G PO TABS: 1.0000 g | ORAL_TABLET | Freq: Three times a day (TID) | ORAL | 1 refills | Status: DC

## 2017-07-17 NOTE — Patient Instructions (Signed)
We will call you with the chest-x-ray and blood test results.

## 2017-07-17 NOTE — Progress Notes (Signed)
Subjective:     Patient ID: Brooke Mcdowell, female   DOB: June 17, 1951, 66 y.o.   MRN: [id number] Chief Complaint  Patient presents with  . Cough    Patient comes in office today with complaints of fever and cough for the past month and half. Patient reports that she was seen and evaluated by [facility] and was prescribed Omnicefand Prednisone for 14days, patient states that symptoms never improved on medication. Patient states this morning she went and had chest-xray done that was ordered, patient reports that she has been having shortness of breath, wheezing and night sweats.    HPI  CXR report pending at time of office visit. States fevers have ranged up to 100.5. Reports she also had negative allergy testing per ENT. Reports heartburn and reflux sx 4 days/week and has not been treating with otc medication. No hx of asthma or smoking but has used inhalers when she has been sick.  Previously worked at [employer].   Review of Systems     Objective:   Physical Exam  Constitutional: She appears well-developed and well-nourished. No distress.  HENT:  No tonsillar erythema or enlargement, no posterior pharyngeal drainage noted.  Pulmonary/Chest: Breath sounds normal. She has no wheezes.  Frequent dry cough  Lymphadenopathy:    She has no cervical adenopathy.       Assessment:    1. Chronic cough - CBC with Differential/Platelet  2. Gastroesophageal reflux disease without esophagitis: Samples of Dexilant x 15 - sucralfate (CARAFATE) 1 g tablet; Take 1 tablet (1 g total) by mouth 4 (four) times daily -  with meals and at bedtime.  Dispense: 28 tablet; Refill: 1    Plan:    Further f/u pending lab work and CXR report.

## 2017-07-18 ENCOUNTER — Telehealth: Payer: Self-pay

## 2017-07-18 NOTE — Telephone Encounter (Signed)
-----   Message from Carmon Ginsberg, [place] sent at 07/18/2017  7:32 AM EDT ----- Blood count ok. No anemia or white count elevation suggesting hidden infection

## 2017-07-18 NOTE — Telephone Encounter (Signed)
[id number] 

## 2017-07-21 DIAGNOSIS — H40053 Ocular hypertension, bilateral: Secondary | ICD-10-CM | POA: Diagnosis not present

## 2017-07-21 NOTE — Telephone Encounter (Signed)
[id number] 

## 2017-07-26 NOTE — Telephone Encounter (Signed)
Patient advised. kw °

## 2017-08-01 ENCOUNTER — Other Ambulatory Visit: Payer: Self-pay | Admitting: Family Medicine

## 2017-08-01 ENCOUNTER — Other Ambulatory Visit: Payer: Self-pay | Admitting: Obstetrics and Gynecology

## 2017-08-01 DIAGNOSIS — Z1231 Encounter for screening mammogram for malignant neoplasm of breast: Secondary | ICD-10-CM

## 2017-08-22 ENCOUNTER — Ambulatory Visit
Admission: RE | Admit: 2017-08-22 | Discharge: 2017-08-22 | Disposition: A | Discharge status: Home / Self Care | Payer: PPO | Source: Ambulatory Visit | Attending: Obstetrics and Gynecology | Admitting: Obstetrics and Gynecology

## 2017-08-22 DIAGNOSIS — Z1231 Encounter for screening mammogram for malignant neoplasm of breast: Secondary | ICD-10-CM | POA: Diagnosis not present

## 2017-08-29 ENCOUNTER — Inpatient Hospital Stay
Admission: RE | Admit: 2017-08-29 | Discharge: 2017-08-29 | Disposition: A | Discharge status: Home / Self Care | Payer: Self-pay | Source: Ambulatory Visit | Attending: *Deleted | Admitting: *Deleted

## 2017-08-29 ENCOUNTER — Other Ambulatory Visit: Payer: Self-pay | Admitting: *Deleted

## 2017-08-29 DIAGNOSIS — Z9289 Personal history of other medical treatment: Secondary | ICD-10-CM

## 2017-09-12 ENCOUNTER — Encounter: Payer: Self-pay | Admitting: Physician Assistant

## 2017-09-12 ENCOUNTER — Ambulatory Visit (INDEPENDENT_AMBULATORY_CARE_PROVIDER_SITE_OTHER): Payer: PPO | Admitting: Physician Assistant

## 2017-09-12 ENCOUNTER — Telehealth: Payer: Self-pay | Admitting: Family Medicine

## 2017-09-12 VITALS — BP 124/72 | HR 72 | Temp 98.5°F | Resp 16 | Ht 61.5 in | Wt 112.0 lb

## 2017-09-12 DIAGNOSIS — Z131 Encounter for screening for diabetes mellitus: Secondary | ICD-10-CM | POA: Diagnosis not present

## 2017-09-12 DIAGNOSIS — Z1329 Encounter for screening for other suspected endocrine disorder: Secondary | ICD-10-CM

## 2017-09-12 DIAGNOSIS — F419 Anxiety disorder, unspecified: Secondary | ICD-10-CM | POA: Diagnosis not present

## 2017-09-12 DIAGNOSIS — G4700 Insomnia, unspecified: Secondary | ICD-10-CM

## 2017-09-12 DIAGNOSIS — Z0000 Encounter for general adult medical examination without abnormal findings: Secondary | ICD-10-CM

## 2017-09-12 DIAGNOSIS — Z13220 Encounter for screening for lipoid disorders: Secondary | ICD-10-CM

## 2017-09-12 DIAGNOSIS — Z79890 Hormone replacement therapy (postmenopausal): Secondary | ICD-10-CM | POA: Diagnosis not present

## 2017-09-12 DIAGNOSIS — G43009 Migraine without aura, not intractable, without status migrainosus: Secondary | ICD-10-CM

## 2017-09-12 DIAGNOSIS — Z13820 Encounter for screening for osteoporosis: Secondary | ICD-10-CM | POA: Diagnosis not present

## 2017-09-12 DIAGNOSIS — Z23 Encounter for immunization: Secondary | ICD-10-CM | POA: Diagnosis not present

## 2017-09-12 MED ORDER — ZOLPIDEM TARTRATE 10 MG PO TABS: 5.0000 mg | ORAL_TABLET | Freq: Every day | ORAL | 3 refills | Status: DC

## 2017-09-12 MED ORDER — RIZATRIPTAN BENZOATE 10 MG PO TABS: ORAL_TABLET | ORAL | 3 refills | Status: DC

## 2017-09-12 NOTE — Progress Notes (Signed)
Patient: Brooke Mcdowell, Female    DOB: 05/15/1951, 66 y.o.   MRN: [id number] Visit Date: 09/12/2017  Today's Provider: Trinna Post, PA-C   Chief Complaint  Patient presents with  . Medicare Wellness   Subjective:    Annual wellness visit Brooke Mcdowell is a 66 y.o. female. She feels well. She reports exercising three days a week. She reports she is sleeping poorly.  Pt reports this is a chronic issue.    Mammo 08/2017: normal PAP/HPV 11/12/2015: Normal and negative Colonoscopy: 12/10/2010 with Dr. 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   She has a longstanding history of migraines without aura, takes Maxalt.  She also has a lonstanding history of insomnia. She has tried melatonin, trazodone, and OTC sleep aids without relief. She has been stable on 5 mg Ambien nightly with good relief for sleeping.   She takes zoloft 50 mg daily for depression and anxiety, under good control.   She is taking tramadol sparingly for cervical spondylosis. She sees Dr. Sharlet Salina for this. She has had ESI without great relief. Decided mutually with Dr. Sharlet Salina not to proceed with nerve ablation as it was not felt to be helpful. May have to consider surgery in the future.  She has been menopausal since age 61. She has been on hormone therapy with combination estrogen and progestin as she still does have her uterus, she has been on these for years. She says she has intolerable vasomotor symptoms including hot flashes and feeling flu like after trying to discontinue. Current regimen includes 1 mg estradiol daily and 2.5 mg mderoxyprogesterone daily. She denies history of stroke, blood clot, breast cancer, heart attack.  She had her flu shot this year.  -----------------------------------------------------------   Review of Systems  Social History   Socioeconomic History  . Marital status: Divorced    Spouse name: Not on file    . Number of children: Not on file  . Years of education: Not on file  . Highest education level: Not on file  Social Needs  . Financial resource strain: Not on file  . Food insecurity - worry: Not on file  . Food insecurity - inability: Not on file  . Transportation needs - medical: Not on file  . Transportation needs - non-medical: Not on file  Occupational History  . Not on file  Tobacco Use  . Smoking status: Never Smoker  . Smokeless tobacco: Never Used  Substance and Sexual Activity  . Alcohol use: Yes    Comment: rarely  . Drug use: No  . Sexual activity: Not on file  Other Topics Concern  . Not on file  Social History Narrative  . Not on file    Past Medical History:  Diagnosis Date  . Arthropathy of hand   . Dysthymic disorder   . Esophageal reflux   . Insomnia   . Lumbar herniated disc   . Migraine   . Panic disorder   . Pure hypercholesterolemia   . Vitamin D deficiency      Patient Active Problem List   Diagnosis Date Noted  . Anxiety 08/05/2016  . Restless legs 08/05/2016  . Insomnia 08/04/2016  . Esophageal reflux   . Vitamin D deficiency   . Pure hypercholesterolemia   . Migraine   . Dysthymic disorder   . Arthropathy of hand   . Panic disorder  Past Surgical History:  Procedure Laterality Date  . clot removal from right sinus after surgery to remove tumor in 1995    . LITHOTRIPSY  [phone]   renal stones  . TUBAL LIGATION    . TUMOR REMOVAL     behind right ete in sinuses- 1995 Dr. Pryor Ochoa    Her family history includes Brain cancer in her father and mother; Cancer in her father and mother.      Current Outpatient Medications:  .  estradiol (ESTRACE) 1 MG tablet, TK 1 T PO QD, Disp: , Rfl: 8 .  omeprazole (PRILOSEC) 20 MG capsule, Take 20 mg by mouth daily., Disp: , Rfl:  .  progesterone (PROMETRIUM) 100 MG capsule, Take 50 mg by mouth daily., Disp: , Rfl:  .  rizatriptan (MAXALT) 10 MG tablet, Take 10 mg by mouth as  needed. , Disp: , Rfl: 3 .  sertraline (ZOLOFT) 50 MG tablet, Take 50 mg by mouth daily. , Disp: , Rfl: 2 .  sucralfate (CARAFATE) 1 g tablet, Take 1 tablet (1 g total) by mouth 4 (four) times daily -  with meals and at bedtime., Disp: 28 tablet, Rfl: 1 .  traMADol (ULTRAM) 50 MG tablet, TK 1/2 TO 1 T PO BID PRN, Disp: , Rfl: 2 .  zolpidem (AMBIEN) 10 MG tablet, TK 1 T PO QHS PRF SLP, Disp: , Rfl: 3 .  azelastine (ASTELIN) 0.1 % nasal spray, Place 1 spray into both nostrils 2 (two) times daily. Use in each nostril as directed, Disp: , Rfl:  .  chlorpheniramine-HYDROcodone (TUSSIONEX) 10-8 MG/5ML SUER, TK 5 ML PO Q 12 H PRN COU, Disp: , Rfl: 0 .  FLUZONE HIGH-DOSE 0.5 ML injection, ADM 0.5ML IM UTD, Disp: , Rfl: 0  Patient Care Team: Chrismon, Vickki Muff, PA as PCP - General (Family Medicine) Vin-Parikh, Deirdre Peer, MD as Referring Physician (Ophthalmology) Sharlet Salina, MD as Referring Physician (Physical Medicine and Rehabilitation) Catheryn Bacon, CNM as Midwife (Obstetrics and Gynecology)     Objective:   Vitals: BP 124/72 (BP Location: Left Arm, Patient Position: Sitting, Cuff Size: Normal)   Pulse 72   Temp 98.5 F (36.9 C) (Oral)   Resp 16   Ht 5' 1.5" (1.562 m)   Wt 112 lb (50.8 kg)   BMI 20.82 kg/m   Physical Exam  Constitutional: She is oriented to person, place, and time. She appears well-developed and well-nourished.  HENT:  Right Ear: Tympanic membrane and external ear normal.  Left Ear: Tympanic membrane and external ear normal.  Mouth/Throat: Oropharynx is clear and moist. No oropharyngeal exudate.  Eyes: Conjunctivae are normal.  Neck: Neck supple.  Cardiovascular: Normal rate and regular rhythm.  Pulmonary/Chest: Effort normal and breath sounds normal.  Abdominal: Soft. Bowel sounds are normal.  Musculoskeletal: Normal range of motion.  Lymphadenopathy:    She has no cervical adenopathy.  Neurological: She is alert and oriented to person, place, and  time. No cranial nerve deficit. Coordination normal.  Skin: Skin is warm and dry.  Psychiatric: She has a normal mood and affect. Her behavior is normal.    Activities of Daily Living In your present state of health, do you have any difficulty performing the following activities: 06/13/2017  Hearing? N  Vision? N  Difficulty concentrating or making decisions? N  Walking or climbing stairs? Y  Comment pain due to herniated disc  Dressing or bathing? N  Doing errands, shopping? N  Preparing Food and eating ? N  Using the Toilet? N  In the past six months, have you accidently leaked urine? N  Do you have problems with loss of bowel control? N  Managing your Medications? N  Managing your Finances? N  Housekeeping or managing your Housekeeping? N  Some recent data might be hidden    Fall Risk Assessment Fall Risk  06/13/2017  Falls in the past year? No     Depression Screen PHQ 2/9 Scores 06/13/2017 06/13/2017  PHQ - 2 Score 0 0  PHQ- 9 Score 2 -      Assessment & Plan:     Annual Wellness Visit  Reviewed patient's Family Medical History Reviewed and updated list of patient's medical providers Assessment of cognitive impairment was done Assessed patient's functional ability Established a written schedule for health screening [place] Completed and Reviewed  Exercise Activities and Dietary recommendations Goals    None      Immunization History  Administered Date(s) Administered  . Pneumococcal Conjugate-13 06/13/2017  . Tdap 03/17/2017  . Zoster 04/26/2013    Health Maintenance  Topic Date Due  . DEXA SCAN  11/29/2014  . INFLUENZA VACCINE  05/15/2018 (Originally 05/03/2017)  . PNA vac Low Risk Adult (2 of 2 - PPSV23) 06/13/2018  . MAMMOGRAM  08/23/2019  . COLONOSCOPY  12/19/2020  . TETANUS/TDAP  03/18/2027  . Hepatitis C Screening  Completed     Discussed health benefits of physical activity, and encouraged her to engage in regular  exercise appropriate for her age and condition.    1. Annual physical exam  - CBC with Differential  2. Osteoporosis screening  Due for this, she said she had one remotely.  - DG Bone Density; Future  3. Influenza vaccination administered at current visit   4. Lipid screening  - Lipid Profile  5. Diabetes mellitus screening  - Comprehensive Metabolic Panel (CMET)  6. Thyroid disorder screen  - TSH  7. Insomnia, unspecified type  She has failed multiple other sleep aids. Counseled this is controlled substance, risk of addiction and abuse. Should take as directed.  - zolpidem (AMBIEN) 10 MG tablet; Take 0.5 tablets (5 mg total) by mouth at bedtime.  Dispense: 15 tablet; Refill: 3  8. Migraine without aura and without status migrainosus, not intractable  - rizatriptan (MAXALT) 10 MG tablet; Take 1 tablet for migraine. May repeat in 2 hours if needed. Maximum 30 mg daily.  Dispense: 10 tablet; Refill: 3  9. Hormone replacement therapy (postmenopausal)  Age of menopause 32, still with uterus. Maintained on 1 mg estradiol and 2.5 mg medroxyprogesterone daily. Reviewed alternative treatments and reviewed risks with continued HRT including Stroke, MI, breast cancer. She wishes to continue with HRT.  Return in about 1 year (around 09/12/2018) for CPE.   ------------------------------------------------------------------------------------------------------------    Trinna Post, PA-C  [facility]

## 2017-09-12 NOTE — Patient Instructions (Signed)
If you don't need Nexium, please discontinue. If you have bad rebound heartburn, you may take Zantac (ranitidine) 150 mg once daily for two weeks and then discontinue. If symptoms re-emerge, may take Zantac as needed.     Health Maintenance, Female Adopting a healthy lifestyle and getting preventive care can go a long way to promote health and wellness. Talk with your health care provider about what schedule of regular examinations is right for you. This is a good chance for you to check in with your provider about disease prevention and staying healthy. In between checkups, there are plenty of things you can do on your own. Experts have done a lot of research about which lifestyle changes and preventive measures are most likely to keep you healthy. Ask your health care provider for more information. Weight and diet Eat a healthy diet  Be sure to include plenty of vegetables, fruits, low-fat dairy products, and lean protein.  Do not eat a lot of foods high in solid fats, added sugars, or salt.  Get regular exercise. This is one of the most important things you can do for your health. ? Most adults should exercise for at least 150 minutes each week. The exercise should increase your heart rate and make you sweat (moderate-intensity exercise). ? Most adults should also do strengthening exercises at least twice a week. This is in addition to the moderate-intensity exercise.  Maintain a healthy weight  Body mass index (BMI) is a measurement that can be used to identify possible weight problems. It estimates body fat based on height and weight. Your health care provider can help determine your BMI and help you achieve or maintain a healthy weight.  For females 20 years of age and older: ? A BMI below 18.5 is considered underweight. ? A BMI of 18.5 to 24.9 is normal. ? A BMI of 25 to 29.9 is considered overweight. ? A BMI of 30 and above is considered obese.  Watch levels of cholesterol and  blood lipids  You should start having your blood tested for lipids and cholesterol at 67 years of age, then have this test every 5 years.  You may need to have your cholesterol levels checked more often if: ? Your lipid or cholesterol levels are high. ? You are older than 67 years of age. ? You are at high risk for heart disease.  Cancer screening Lung Cancer  Lung cancer screening is recommended for adults 55-80 years old who are at high risk for lung cancer because of a history of smoking.  A yearly low-dose CT scan of the lungs is recommended for people who: ? Currently smoke. ? Have quit within the past 15 years. ? Have at least a 30-pack-year history of smoking. A pack year is smoking an average of one pack of cigarettes a day for 1 year.  Yearly screening should continue until it has been 15 years since you quit.  Yearly screening should stop if you develop a health problem that would prevent you from having lung cancer treatment.  Breast Cancer  Practice breast self-awareness. This means understanding how your breasts normally appear and feel.  It also means doing regular breast self-exams. Let your health care provider know about any changes, no matter how small.  If you are in your 20s or 30s, you should have a clinical breast exam (CBE) by a health care provider every 1-3 years as part of a regular health exam.  If you are 40 or older,   have a CBE every year. Also consider having a breast X-ray (mammogram) every year.  If you have a family history of breast cancer, talk to your health care provider about genetic screening.  If you are at high risk for breast cancer, talk to your health care provider about having an MRI and a mammogram every year.  Breast cancer gene (BRCA) assessment is recommended for women who have family members with BRCA-related cancers. BRCA-related cancers include: ? Breast. ? Ovarian. ? Tubal. ? Peritoneal cancers.  Results of the assessment  will determine the need for genetic counseling and BRCA1 and BRCA2 testing.  Cervical Cancer Your health care provider may recommend that you be screened regularly for cancer of the pelvic organs (ovaries, uterus, and vagina). This screening involves a pelvic examination, including checking for microscopic changes to the surface of your cervix (Pap test). You may be encouraged to have this screening done every 3 years, beginning at age 21.  For women ages 30-65, health care providers may recommend pelvic exams and Pap testing every 3 years, or they may recommend the Pap and pelvic exam, combined with testing for human papilloma virus (HPV), every 5 years. Some types of HPV increase your risk of cervical cancer. Testing for HPV may also be done on women of any age with unclear Pap test results.  Other health care providers may not recommend any screening for nonpregnant women who are considered low risk for pelvic cancer and who do not have symptoms. Ask your health care provider if a screening pelvic exam is right for you.  If you have had past treatment for cervical cancer or a condition that could lead to cancer, you need Pap tests and screening for cancer for at least 20 years after your treatment. If Pap tests have been discontinued, your risk factors (such as having a new sexual partner) need to be reassessed to determine if screening should resume. Some women have medical problems that increase the chance of getting cervical cancer. In these cases, your health care provider may recommend more frequent screening and Pap tests.  Colorectal Cancer  This type of cancer can be detected and often prevented.  Routine colorectal cancer screening usually begins at 67 years of age and continues through 67 years of age.  Your health care provider may recommend screening at an earlier age if you have risk factors for colon cancer.  Your health care provider may also recommend using home test kits to  check for hidden blood in the stool.  A small camera at the end of a tube can be used to examine your colon directly (sigmoidoscopy or colonoscopy). This is done to check for the earliest forms of colorectal cancer.  Routine screening usually begins at age 50.  Direct examination of the colon should be repeated every 5-10 years through 67 years of age. However, you may need to be screened more often if early forms of precancerous polyps or small growths are found.  Skin Cancer  Check your skin from head to toe regularly.  Tell your health care provider about any new moles or changes in moles, especially if there is a change in a mole's shape or color.  Also tell your health care provider if you have a mole that is larger than the size of a pencil eraser.  Always use sunscreen. Apply sunscreen liberally and repeatedly throughout the day.  Protect yourself by wearing long sleeves, pants, a wide-brimmed hat, and sunglasses whenever you are outside.    Heart disease, diabetes, and high blood pressure  High blood pressure causes heart disease and increases the risk of stroke. High blood pressure is more likely to develop in: ? People who have blood pressure in the high end of the normal range (130-139/85-89 mm Hg). ? People who are overweight or obese. ? People who are African American.  If you are 50-81 years of age, have your blood pressure checked every 3-5 years. If you are 42 years of age or older, have your blood pressure checked every year. You should have your blood pressure measured twice--once when you are at a hospital or clinic, and once when you are not at a hospital or clinic. Record the average of the two measurements. To check your blood pressure when you are not at a hospital or clinic, you can use: ? An automated blood pressure machine at a pharmacy. ? A home blood pressure monitor.  If you are between 83 years and 97 years old, ask your health care provider if you should  take aspirin to prevent strokes.  Have regular diabetes screenings. This involves taking a blood sample to check your fasting blood sugar level. ? If you are at a normal weight and have a low risk for diabetes, have this test once every three years after 67 years of age. ? If you are overweight and have a high risk for diabetes, consider being tested at a younger age or more often. Preventing infection Hepatitis B  If you have a higher risk for hepatitis B, you should be screened for this virus. You are considered at high risk for hepatitis B if: ? You were born in a country where hepatitis B is common. Ask your health care provider which countries are considered high risk. ? Your parents were born in a high-risk country, and you have not been immunized against hepatitis B (hepatitis B vaccine). ? You have HIV or AIDS. ? You use needles to inject street drugs. ? You live with someone who has hepatitis B. ? You have had sex with someone who has hepatitis B. ? You get hemodialysis treatment. ? You take certain medicines for conditions, including cancer, organ transplantation, and autoimmune conditions.  Hepatitis C  Blood testing is recommended for: ? Everyone born from [birth year] through 1966. ? Anyone with known risk factors for hepatitis C.  Sexually transmitted infections (STIs)  You should be screened for sexually transmitted infections (STIs) including gonorrhea and chlamydia if: ? You are sexually active and are younger than 67 years of age. ? You are older than 67 years of age and your health care provider tells you that you are at risk for this type of infection. ? Your sexual activity has changed since you were last screened and you are at an increased risk for chlamydia or gonorrhea. Ask your health care provider if you are at risk.  If you do not have HIV, but are at risk, it may be recommended that you take a prescription medicine daily to prevent HIV infection. This is called  pre-exposure prophylaxis (PrEP). You are considered at risk if: ? You are sexually active and do not regularly use condoms or know the HIV status of your partner(s). ? You take drugs by injection. ? You are sexually active with a partner who has HIV.  Talk with your health care provider about whether you are at high risk of being infected with HIV. If you choose to begin PrEP, you should first be tested for HIV.  You should then be tested every 3 months for as long as you are taking PrEP. Pregnancy  If you are premenopausal and you may become pregnant, ask your health care provider about preconception counseling.  If you may become pregnant, take 400 to 800 micrograms (mcg) of folic acid every day.  If you want to prevent pregnancy, talk to your health care provider about birth control (contraception). Osteoporosis and menopause  Osteoporosis is a disease in which the bones lose minerals and strength with aging. This can result in serious bone fractures. Your risk for osteoporosis can be identified using a bone density scan.  If you are 13 years of age or older, or if you are at risk for osteoporosis and fractures, ask your health care provider if you should be screened.  Ask your health care provider whether you should take a calcium or vitamin D supplement to lower your risk for osteoporosis.  Menopause may have certain physical symptoms and risks.  Hormone replacement therapy may reduce some of these symptoms and risks. Talk to your health care provider about whether hormone replacement therapy is right for you. Follow these instructions at home:  Schedule regular health, dental, and eye exams.  Stay current with your immunizations.  Do not use any tobacco products including cigarettes, chewing tobacco, or electronic cigarettes.  If you are pregnant, do not drink alcohol.  If you are breastfeeding, limit how much and how often you drink alcohol.  Limit alcohol intake to no more  than 1 drink per day for nonpregnant women. One drink equals 12 ounces of beer, 5 ounces of wine, or 1 ounces of hard liquor.  Do not use street drugs.  Do not share needles.  Ask your health care provider for help if you need support or information about quitting drugs.  Tell your health care provider if you often feel depressed.  Tell your health care provider if you have ever been abused or do not feel safe at home. This information is not intended to replace advice given to you by your health care provider. Make sure you discuss any questions you have with your health care provider. Document Released: 04/04/2011 Document Revised: 02/25/2016 Document Reviewed: 06/23/2015 Elsevier Interactive Patient Education  [employer].

## 2017-09-12 NOTE — Telephone Encounter (Signed)
Pt called saying she was in earlier today to see [place] and she was to call back with the name of the medication that she needs a refill on,  Sertraline 50mg  1 daily  Medroxyprogesterone 2.5 mg tablets 1 daily  Estradiol 1 mg daily   She [address] [facility] [address]  Thanks teri

## 2017-09-12 NOTE — Telephone Encounter (Signed)
Please review

## 2017-09-13 ENCOUNTER — Encounter: Payer: Self-pay | Admitting: Physician Assistant

## 2017-09-13 ENCOUNTER — Telehealth: Payer: Self-pay | Admitting: Family Medicine

## 2017-09-13 MED ORDER — SERTRALINE HCL 50 MG PO TABS: 50.0000 mg | ORAL_TABLET | Freq: Every day | ORAL | 0 refills | Status: DC

## 2017-09-13 MED ORDER — ESTRADIOL 1 MG PO TABS: ORAL_TABLET | ORAL | 0 refills | Status: DC

## 2017-09-13 MED ORDER — MEDROXYPROGESTERONE ACETATE 2.5 MG PO TABS: 2.5000 mg | ORAL_TABLET | Freq: Every day | ORAL | 2 refills | Status: DC

## 2017-09-13 NOTE — Telephone Encounter (Signed)
Noted, thank you. Medications sent in.

## 2017-09-13 NOTE — Telephone Encounter (Signed)
Pt was in yesterday and seen Brooke Mcdowell.  Pt states the rx's that were supposed to be called in were not at the pharmacy today when she checked.  The hormone pills and zoloft.  Pt's call back is [phone]  She [facility] [address]  Thanks teri

## 2017-09-13 NOTE — Telephone Encounter (Signed)
Patient advised RX was sent to [facility] today.

## 2017-09-15 DIAGNOSIS — Z13220 Encounter for screening for lipoid disorders: Secondary | ICD-10-CM | POA: Diagnosis not present

## 2017-09-15 DIAGNOSIS — Z1329 Encounter for screening for other suspected endocrine disorder: Secondary | ICD-10-CM | POA: Diagnosis not present

## 2017-09-15 DIAGNOSIS — Z0000 Encounter for general adult medical examination without abnormal findings: Secondary | ICD-10-CM | POA: Diagnosis not present

## 2017-09-15 DIAGNOSIS — Z131 Encounter for screening for diabetes mellitus: Secondary | ICD-10-CM | POA: Diagnosis not present

## 2017-09-16 LAB — COMPREHENSIVE METABOLIC PANEL
ALT: 12 IU/L (ref 0–32)
AST: 18 IU/L (ref 0–40)
Albumin/Globulin Ratio: 1.9 (ref 1.2–2.2)
Albumin: 4.1 g/dL (ref 3.6–4.8)
Alkaline Phosphatase: 51 IU/L (ref 39–117)
BUN/Creatinine Ratio: 17 (ref 12–28)
BUN: 13 mg/dL (ref 8–27)
Bilirubin Total: 0.4 mg/dL (ref 0.0–1.2)
CO2: 24 mmol/L (ref 20–29)
Calcium: 9.5 mg/dL (ref 8.7–10.3)
Chloride: 107 mmol/L — ABNORMAL HIGH (ref 96–106)
Creatinine, Ser: 0.78 mg/dL (ref 0.57–1.00)
GFR calc Af Amer: 91 mL/min/{1.73_m2} (ref >59)
GFR calc non Af Amer: 79 mL/min/{1.73_m2} (ref >59)
Globulin, Total: 2.2 g/dL (ref 1.5–4.5)
Glucose: 85 mg/dL (ref 65–99)
Potassium: 4.3 mmol/L (ref 3.5–5.2)
Sodium: 145 mmol/L — ABNORMAL HIGH (ref 134–144)
Total Protein: 6.3 g/dL (ref 6.0–8.5)

## 2017-09-16 LAB — CBC WITH DIFFERENTIAL/PLATELET
Basophils Absolute: 0 10*3/uL (ref 0.0–0.2)
Basos: 1 %
EOS (ABSOLUTE): 0.1 10*3/uL (ref 0.0–0.4)
Eos: 2 %
Hematocrit: 41.8 % (ref 34.0–46.6)
Hemoglobin: 13.8 g/dL (ref 11.1–15.9)
Immature Grans (Abs): 0 10*3/uL (ref 0.0–0.1)
Immature Granulocytes: 0 %
Lymphocytes Absolute: 1.8 10*3/uL (ref 0.7–3.1)
Lymphs: 31 %
MCH: 30.6 pg (ref 26.6–33.0)
MCHC: 33 g/dL (ref 31.5–35.7)
MCV: 93 fL (ref 79–97)
Monocytes Absolute: 0.5 10*3/uL (ref 0.1–0.9)
Monocytes: 9 %
Neutrophils Absolute: 3.3 10*3/uL (ref 1.4–7.0)
Neutrophils: 57 %
Platelets: 286 10*3/uL (ref 150–379)
RBC: 4.51 x10E6/uL (ref 3.77–5.28)
RDW: 13.1 % (ref 12.3–15.4)
WBC: 5.7 10*3/uL (ref 3.4–10.8)

## 2017-09-16 LAB — LIPID PANEL
Chol/HDL Ratio: 4 ratio (ref 0.0–4.4)
Cholesterol, Total: 225 mg/dL — ABNORMAL HIGH (ref 100–199)
HDL: 56 mg/dL (ref >39)
LDL Calculated: 143 mg/dL — ABNORMAL HIGH (ref 0–99)
Triglycerides: 132 mg/dL (ref 0–149)
VLDL Cholesterol Cal: 26 mg/dL (ref 5–40)

## 2017-09-16 LAB — SPECIMEN STATUS REPORT

## 2017-09-16 LAB — TSH: TSH: 2.54 u[IU]/mL (ref 0.450–4.500)

## 2017-10-04 ENCOUNTER — Other Ambulatory Visit: Payer: Self-pay | Admitting: Physician Assistant

## 2017-10-04 DIAGNOSIS — G4700 Insomnia, unspecified: Secondary | ICD-10-CM

## 2017-10-04 NOTE — Telephone Encounter (Signed)
She reported to me in office that her dose was 5 mg and we discussed splitting pills, so 15 tablets of 10 mg would be 30 days. I do see in the controlled substance where she was getting 10 mg nightly. If she wants to increase back to this, I will cancel the remainder of her prescription and send in a new one. She should note this is above the recommended dose for women over 65 and carries risk of over sedation and reduction of respiratory drive, especially in combination with tramadol.

## 2017-10-04 NOTE — Telephone Encounter (Signed)
Pt. Said you only gave her 15 tabs of Ambien and she has been breaking these into but she cannot sleep.   Please call her regarding this.   [phone]

## 2017-10-04 NOTE — Telephone Encounter (Signed)
Pt advised.  She would like to go back on the 10mg  of Ambien at night.  She is aware of the risks with taking that high of a dose.  She reports she does not sleep well without 10mg  a night.    Thanks,   -Mickel Baas

## 2017-10-06 MED ORDER — ZOLPIDEM TARTRATE 10 MG PO TABS: 10.0000 mg | ORAL_TABLET | Freq: Every evening | ORAL | 2 refills | Status: DC | PRN

## 2017-10-06 NOTE — Telephone Encounter (Signed)
Rx called in.   Thanks,   -Mickel Baas

## 2017-10-06 NOTE — Addendum Note (Signed)
Addended by: Trinna Post on: 10/06/2017 01:16 PM   Modules accepted: Orders

## 2017-10-06 NOTE — Telephone Encounter (Signed)
Can we please call her pharmacy and cancel any remaining prescription of the five milligrams and call in the new Ambien 10 mg QHS PRN #[id number] with two refills prescription I sent in? Thank you.

## 2017-10-10 DIAGNOSIS — M5416 Radiculopathy, lumbar region: Secondary | ICD-10-CM | POA: Diagnosis not present

## 2017-10-10 DIAGNOSIS — M47816 Spondylosis without myelopathy or radiculopathy, lumbar region: Secondary | ICD-10-CM | POA: Diagnosis not present

## 2017-10-10 DIAGNOSIS — M5136 Other intervertebral disc degeneration, lumbar region: Secondary | ICD-10-CM | POA: Diagnosis not present

## 2017-10-19 DIAGNOSIS — M48062 Spinal stenosis, lumbar region with neurogenic claudication: Secondary | ICD-10-CM | POA: Diagnosis not present

## 2017-10-19 DIAGNOSIS — M5136 Other intervertebral disc degeneration, lumbar region: Secondary | ICD-10-CM | POA: Diagnosis not present

## 2017-10-19 DIAGNOSIS — M5416 Radiculopathy, lumbar region: Secondary | ICD-10-CM | POA: Diagnosis not present

## 2017-10-20 DIAGNOSIS — D2272 Melanocytic nevi of left lower limb, including hip: Secondary | ICD-10-CM | POA: Diagnosis not present

## 2017-10-20 DIAGNOSIS — R208 Other disturbances of skin sensation: Secondary | ICD-10-CM | POA: Diagnosis not present

## 2017-10-20 DIAGNOSIS — Z85828 Personal history of other malignant neoplasm of skin: Secondary | ICD-10-CM | POA: Diagnosis not present

## 2017-10-20 DIAGNOSIS — D2261 Melanocytic nevi of right upper limb, including shoulder: Secondary | ICD-10-CM | POA: Diagnosis not present

## 2017-10-20 DIAGNOSIS — X32XXXA Exposure to sunlight, initial encounter: Secondary | ICD-10-CM | POA: Diagnosis not present

## 2017-10-20 DIAGNOSIS — L820 Inflamed seborrheic keratosis: Secondary | ICD-10-CM | POA: Diagnosis not present

## 2017-10-20 DIAGNOSIS — L570 Actinic keratosis: Secondary | ICD-10-CM | POA: Diagnosis not present

## 2017-10-20 DIAGNOSIS — D225 Melanocytic nevi of trunk: Secondary | ICD-10-CM | POA: Diagnosis not present

## 2017-10-30 ENCOUNTER — Ambulatory Visit
Admission: RE | Admit: 2017-10-30 | Discharge: 2017-10-30 | Disposition: A | Discharge status: Home / Self Care | Payer: PPO | Source: Ambulatory Visit | Attending: Physician Assistant | Admitting: Physician Assistant

## 2017-10-30 DIAGNOSIS — M85831 Other specified disorders of bone density and structure, right forearm: Secondary | ICD-10-CM | POA: Insufficient documentation

## 2017-10-30 DIAGNOSIS — Z13820 Encounter for screening for osteoporosis: Secondary | ICD-10-CM | POA: Insufficient documentation

## 2017-10-30 DIAGNOSIS — Z780 Asymptomatic menopausal state: Secondary | ICD-10-CM | POA: Diagnosis not present

## 2017-11-01 ENCOUNTER — Encounter: Payer: Self-pay | Admitting: Physician Assistant

## 2017-11-01 ENCOUNTER — Telehealth: Payer: Self-pay

## 2017-11-01 NOTE — Telephone Encounter (Signed)
-----   Message from Trinna Post, [place] sent at 11/01/2017  9:46 AM EST ----- DEXA shows osteopenia, which is lower bone density but doesn't require Rx medication. Should get 1200 mg daily calcium and 800 IU daily vitamin D and do weight bearing exercises. Can repeat in 2 years.

## 2017-11-01 NOTE — Telephone Encounter (Signed)
Pt advised.   Thanks,   -Tamea Bai  

## 2017-11-10 DIAGNOSIS — H43813 Vitreous degeneration, bilateral: Secondary | ICD-10-CM | POA: Diagnosis not present

## 2017-11-27 DIAGNOSIS — H903 Sensorineural hearing loss, bilateral: Secondary | ICD-10-CM | POA: Diagnosis not present

## 2017-12-08 ENCOUNTER — Encounter: Payer: Self-pay | Admitting: Physician Assistant

## 2017-12-08 ENCOUNTER — Ambulatory Visit (INDEPENDENT_AMBULATORY_CARE_PROVIDER_SITE_OTHER): Payer: PPO | Admitting: Physician Assistant

## 2017-12-08 VITALS — BP 114/70 | HR 64 | Temp 98.7°F | Resp 16 | Wt 114.0 lb

## 2017-12-08 DIAGNOSIS — R69 Illness, unspecified: Secondary | ICD-10-CM

## 2017-12-08 DIAGNOSIS — R509 Fever, unspecified: Secondary | ICD-10-CM

## 2017-12-08 DIAGNOSIS — R059 Cough, unspecified: Secondary | ICD-10-CM

## 2017-12-08 DIAGNOSIS — R05 Cough: Secondary | ICD-10-CM

## 2017-12-08 DIAGNOSIS — J111 Influenza due to unidentified influenza virus with other respiratory manifestations: Secondary | ICD-10-CM

## 2017-12-08 LAB — POCT INFLUENZA A/B
Influenza A, POC: NEGATIVE
Influenza B, POC: NEGATIVE

## 2017-12-08 NOTE — Patient Instructions (Signed)

## 2017-12-08 NOTE — Progress Notes (Signed)
Patient: Brooke Mcdowell Female    DOB: Jul 13, 1951   67 y.o.   MRN: [id number] Visit Date: 12/08/2017  Today's Provider: Trinna Post, PA-C   Chief Complaint  Patient presents with  . URI    started about three days ago.   Subjective:    Brooke Mcdowell is a 67 y/o woman presenting today with cough and congestion x 3 days. She has a sick contact, her 35 year old grandson. Pertinent info below.  URI   This is a new problem. The current episode started in the past 7 days. The problem has been gradually worsening. The maximum temperature recorded prior to her arrival was 100.4 - 100.9 F. Associated symptoms include congestion, coughing, a plugged ear sensation, rhinorrhea, sinus pain and a sore throat. Pertinent negatives include no abdominal pain, chest pain, diarrhea, dysuria, ear pain, headaches, joint pain, joint swelling, nausea, neck pain, sneezing, swollen glands, vomiting or wheezing. She has tried decongestant and acetaminophen for the symptoms. The treatment provided mild relief.       Allergies  Allergen Reactions  . Gabapentin Other (See Comments)    Caused migraines  . Tetracyclines & Related Itching and Nausea And Vomiting  . Penicillins Nausea And Vomiting and Rash  . Sulfa Antibiotics Rash     Current Outpatient Medications:  .  estradiol (ESTRACE) 1 MG tablet, Take one pill daily., Disp: 90 tablet, Rfl: 0 .  medroxyPROGESTERone (PROVERA) 2.5 MG tablet, Take 1 tablet (2.5 mg total) by mouth daily., Disp: 30 tablet, Rfl: 2 .  omeprazole (PRILOSEC) 20 MG capsule, Take 20 mg by mouth daily., Disp: , Rfl:  .  rizatriptan (MAXALT) 10 MG tablet, Take 1 tablet for migraine. May repeat in 2 hours if needed. Maximum 30 mg daily., Disp: 10 tablet, Rfl: 3 .  sertraline (ZOLOFT) 50 MG tablet, Take 1 tablet (50 mg total) by mouth daily., Disp: 90 tablet, Rfl: 0 .  traMADol (ULTRAM) 50 MG tablet, TK 1/2 TO 1 T PO BID PRN, Disp: , Rfl: 2 .  FLUZONE HIGH-DOSE 0.5 ML  injection, ADM 0.5ML IM UTD, Disp: , Rfl: 0 .  zolpidem (AMBIEN) 10 MG tablet, Take 1 tablet (10 mg total) by mouth at bedtime as needed for sleep., Disp: 30 tablet, Rfl: 2  Review of Systems  Constitutional: Positive for chills, diaphoresis, fatigue and fever. Negative for activity change, appetite change and unexpected weight change.  HENT: Positive for congestion, postnasal drip, rhinorrhea, sinus pressure, sinus pain and sore throat. Negative for ear pain, hearing loss, sneezing and tinnitus.   Eyes: Negative.   Respiratory: Positive for cough, chest tightness and shortness of breath. Negative for wheezing.   Cardiovascular: Negative for chest pain.  Gastrointestinal: Negative.  Negative for abdominal pain, diarrhea, nausea and vomiting.  Genitourinary: Negative for dysuria.  Musculoskeletal: Positive for myalgias. Negative for joint pain and neck pain.  Neurological: Negative for dizziness and headaches.    Social History   Tobacco Use  . Smoking status: Never Smoker  . Smokeless tobacco: Never Used  Substance Use Topics  . Alcohol use: Yes    Comment: rarely   Objective:   BP 114/70 (BP Location: Right Arm, Patient Position: Sitting, Cuff Size: Normal)   Pulse 64   Temp 98.7 F (37.1 C) (Oral)   Resp 16   Wt 114 lb (51.7 kg)   SpO2 97%   BMI 21.19 kg/m  Vitals:   12/08/17 1113  BP: 114/70  Pulse: 64  Resp: 16  Temp: 98.7 F (37.1 C)  TempSrc: Oral  SpO2: 97%  Weight: 114 lb (51.7 kg)     Physical Exam  Constitutional: She appears well-developed and well-nourished.  HENT:  Right Ear: External ear normal.  Left Ear: External ear normal.  Mouth/Throat: Oropharynx is clear and moist. No oropharyngeal exudate.  Eyes: Right eye exhibits discharge. Left eye exhibits discharge.  Neck: Neck supple.  Cardiovascular: Normal rate and regular rhythm.  Pulmonary/Chest: Effort normal and breath sounds normal. No respiratory distress. She has no rales.  Lymphadenopathy:     She has cervical adenopathy.  Skin: Skin is warm and dry.  Psychiatric: She has a normal mood and affect. Her behavior is normal.        Assessment & Plan:     1. Influenza-like illness  Rapid flu negative. Counseled this could be false negative and we can still treat with Tamiflu, despite being out of window, though I think she will improve regardless. She declines Tamiflue. Counseled on sx tx.  2. Cough with fever  - POCT Influenza A/B  Return if symptoms worsen or fail to improve.  The entirety of the information documented in the History of Present Illness, Review of Systems and Physical Exam were personally obtained by me. Portions of this information were initially documented by Ashley Royalty, CMA and reviewed by me for thoroughness and accuracy.          Trinna Post, PA-C  [facility]

## 2017-12-11 DIAGNOSIS — H43813 Vitreous degeneration, bilateral: Secondary | ICD-10-CM | POA: Diagnosis not present

## 2018-01-12 ENCOUNTER — Other Ambulatory Visit: Payer: Self-pay | Admitting: Physician Assistant

## 2018-01-12 DIAGNOSIS — F419 Anxiety disorder, unspecified: Secondary | ICD-10-CM

## 2018-01-12 DIAGNOSIS — G4700 Insomnia, unspecified: Secondary | ICD-10-CM

## 2018-01-12 MED ORDER — SERTRALINE HCL 50 MG PO TABS: 50.0000 mg | ORAL_TABLET | Freq: Every day | ORAL | 1 refills | Status: DC

## 2018-01-12 NOTE — Telephone Encounter (Signed)
Patient is requesting a refill on the following medication  sertraline (ZOLOFT) 50 MG tablet  She uses [facility] on [address].

## 2018-02-12 ENCOUNTER — Other Ambulatory Visit: Payer: Self-pay | Admitting: Physician Assistant

## 2018-02-12 DIAGNOSIS — G4700 Insomnia, unspecified: Secondary | ICD-10-CM

## 2018-02-23 ENCOUNTER — Encounter: Payer: Self-pay | Admitting: Physician Assistant

## 2018-02-23 ENCOUNTER — Telehealth: Payer: Self-pay | Admitting: Physician Assistant

## 2018-02-23 ENCOUNTER — Ambulatory Visit (INDEPENDENT_AMBULATORY_CARE_PROVIDER_SITE_OTHER): Payer: PPO | Admitting: Physician Assistant

## 2018-02-23 VITALS — BP 126/78 | HR 64 | Temp 98.6°F | Resp 16 | Wt 112.0 lb

## 2018-02-23 DIAGNOSIS — R1011 Right upper quadrant pain: Secondary | ICD-10-CM

## 2018-02-23 DIAGNOSIS — R1013 Epigastric pain: Secondary | ICD-10-CM | POA: Diagnosis not present

## 2018-02-23 NOTE — Telephone Encounter (Signed)
Pt was in this am and seen Adriana and she wanted to schd an abd [place]  Pt checked with insurance and they will pay for [employer] or [place] pt imaging  Thanks teri

## 2018-02-23 NOTE — Patient Instructions (Signed)
Cholelithiasis Cholelithiasis is also called "gallstones." It is a kind of gallbladder disease. The gallbladder is an organ that stores a liquid (bile) that helps you digest fat. Gallstones may not cause symptoms (may be silent gallstones) until they cause a blockage, and then they can cause pain (gallbladder attack). Follow these instructions at home:  Take over-the-counter and prescription medicines only as told by your doctor.  Stay at a healthy weight.  Eat healthy foods. This includes: ? Eating fewer fatty foods, like fried foods. ? Eating fewer refined carbs (refined carbohydrates). Refined carbs are breads and grains that are highly processed, like white bread and white rice. Instead, choose whole grains like whole-wheat bread and brown rice. ? Eating more fiber. Almonds, fresh fruit, and beans are healthy sources of fiber.  Keep all follow-up visits as told by your doctor. This is important. Contact a doctor if:  You have sudden pain in the upper right side of your belly (abdomen). Pain might spread to your right shoulder or your chest. This may be a sign of a gallbladder attack.  You feel sick to your stomach (are nauseous).  You throw up (vomit).  You have been diagnosed with gallstones that have no symptoms and you get: ? Belly pain. ? Discomfort, burning, or fullness in the upper part of your belly (indigestion). Get help right away if:  You have sudden pain in the upper right side of your belly, and it lasts for more than 2 hours.  You have belly pain that lasts for more than 5 hours.  You have a fever or chills.  You keep feeling sick to your stomach or you keep throwing up.  Your skin or the whites of your eyes turn yellow (jaundice).  You have dark-colored pee (urine).  You have light-colored poop (stool). Summary  Cholelithiasis is also called "gallstones."  The gallbladder is an organ that stores a liquid (bile) that helps you digest fat.  Silent  gallstones are gallstones that do not cause symptoms.  A gallbladder attack may cause sudden pain in the upper right side of your belly. Pain might spread to your right shoulder or your chest. If this happens, contact your doctor.  If you have sudden pain in the upper right side of your belly that lasts for more than 2 hours, get help right away. This information is not intended to replace advice given to you by your health care provider. Make sure you discuss any questions you have with your health care provider. Document Released: 03/07/2008 Document Revised: 06/05/2016 Document Reviewed: 06/05/2016 Elsevier Interactive Patient Education  2017 [employer]  

## 2018-02-23 NOTE — Progress Notes (Signed)
Patient: Brooke Mcdowell Female    DOB: Apr 08, 1951   67 y.o.   MRN: [id number] Visit Date: 02/23/2018  Today's Provider: Trinna Post, PA-C   Chief Complaint  Patient presents with  . Abdominal Pain   Subjective:     Abdominal Pain  This is a recurrent problem. The current episode started more than 1 year ago. The problem has been gradually worsening (Has had trouble for over a year but worsening in the last few months). The pain is located in the RUQ. The quality of the pain is sharp and burning. Associated symptoms include constipation, diarrhea, flatus and nausea. Pertinent negatives include no anorexia, arthralgias, belching, dysuria, fever, frequency, headaches, hematuria, melena, myalgias, vomiting or weight loss. The pain is aggravated by eating. She has tried proton pump inhibitors for the symptoms. The treatment provided no relief.       Allergies  Allergen Reactions  . Gabapentin Other (See Comments)    Caused migraines  . Tetracyclines & Related Itching and Nausea And Vomiting  . Penicillins Nausea And Vomiting and Rash  . Sulfa Antibiotics Rash     Current Outpatient Medications:  .  estradiol (ESTRACE) 1 MG tablet, Take one pill daily., Disp: 90 tablet, Rfl: 0 .  magnesium 30 MG tablet, Take 30 mg by mouth 2 (two) times daily., Disp: , Rfl:  .  omeprazole (PRILOSEC) 20 MG capsule, Take 20 mg by mouth daily., Disp: , Rfl:  .  rizatriptan (MAXALT) 10 MG tablet, Take 1 tablet for migraine. May repeat in 2 hours if needed. Maximum 30 mg daily., Disp: 10 tablet, Rfl: 3 .  sertraline (ZOLOFT) 50 MG tablet, Take 1 tablet (50 mg total) by mouth daily., Disp: 90 tablet, Rfl: 1 .  traMADol (ULTRAM) 50 MG tablet, TK 1/2 TO 1 T PO BID PRN, Disp: , Rfl: 2 .  zolpidem (AMBIEN) 10 MG tablet, TAKE 1 TABLET BY MOUTH EVERY NIGHT AT BEDTIME, Disp: 30 tablet, Rfl: 0 .  FLUZONE HIGH-DOSE 0.5 ML injection, ADM 0.5ML IM UTD, Disp: , Rfl: 0 .  medroxyPROGESTERone (PROVERA) 2.5 MG  tablet, Take 1 tablet (2.5 mg total) by mouth daily., Disp: 30 tablet, Rfl: 2  Review of Systems  Constitutional: Negative.  Negative for fever and weight loss.  Gastrointestinal: Positive for abdominal distention, abdominal pain, constipation, diarrhea, flatus and nausea. Negative for anal bleeding, anorexia, blood in stool, melena, rectal pain and vomiting.  Genitourinary: Negative.  Negative for dysuria, frequency and hematuria.  Musculoskeletal: Negative for arthralgias and myalgias.  Neurological: Negative for dizziness, light-headedness and headaches.    Social History   Tobacco Use  . Smoking status: Never Smoker  . Smokeless tobacco: Never Used  Substance Use Topics  . Alcohol use: Yes    Comment: rarely   Objective:   BP 126/78 (BP Location: Right Arm, Patient Position: Sitting, Cuff Size: Normal)   Pulse 64   Temp 98.6 F (37 C) (Oral)   Resp 16   Wt 112 lb (50.8 kg)   BMI 20.82 kg/m  Vitals:   02/23/18 1055  BP: 126/78  Pulse: 64  Resp: 16  Temp: 98.6 F (37 C)  TempSrc: Oral  Weight: 112 lb (50.8 kg)     Physical Exam  Constitutional: She is oriented to person, place, and time. She appears well-developed and well-nourished.  Abdominal: Bowel sounds are normal. There is no splenomegaly or hepatomegaly. There is tenderness in the right upper quadrant and epigastric area.  There is no rigidity, no rebound, no guarding and no CVA tenderness.  Neurological: She is alert and oriented to person, place, and time.  Skin: Skin is warm and dry.  Psychiatric: She has a normal mood and affect. Her behavior is normal.        Assessment & Plan:     1. RUQ pain  Will work up as below. Have her take omeprazole 20 mg bid to treat empirically for ulcer. Offered GI referral today, she would like to see results of labwork first. Low threshold to send to GI given new onset of symptoms and age.  - Comprehensive Metabolic Panel (CMET) - CBC with Differential - Lipase -  Amylase - US Abdomen Limited RUQ; Future - H. pylori breath test  2. Epigastric pain  Return if symptoms worsen or fail to improve.  The entirety of the information documented in the History of Present Illness, Review of Systems and Physical Exam were personally obtained by me. Portions of this information were initially documented by Ashley Royalty, CMA and reviewed by me for thoroughness and accuracy.         Trinna Post, PA-C  [facility]

## 2018-02-26 LAB — COMPREHENSIVE METABOLIC PANEL
ALT: 13 IU/L (ref 0–32)
AST: 15 IU/L (ref 0–40)
Albumin/Globulin Ratio: 1.8 (ref 1.2–2.2)
Albumin: 4.3 g/dL (ref 3.6–4.8)
Alkaline Phosphatase: 52 IU/L (ref 39–117)
BUN/Creatinine Ratio: 17 (ref 12–28)
BUN: 14 mg/dL (ref 8–27)
Bilirubin Total: 0.4 mg/dL (ref 0.0–1.2)
CO2: 22 mmol/L (ref 20–29)
Calcium: 9.8 mg/dL (ref 8.7–10.3)
Chloride: 108 mmol/L — ABNORMAL HIGH (ref 96–106)
Creatinine, Ser: 0.83 mg/dL (ref 0.57–1.00)
GFR calc Af Amer: 84 mL/min/{1.73_m2} (ref >59)
GFR calc non Af Amer: 73 mL/min/{1.73_m2} (ref >59)
Globulin, Total: 2.4 g/dL (ref 1.5–4.5)
Glucose: 87 mg/dL (ref 65–99)
Potassium: 4.9 mmol/L (ref 3.5–5.2)
Sodium: 143 mmol/L (ref 134–144)
Total Protein: 6.7 g/dL (ref 6.0–8.5)

## 2018-02-26 LAB — CBC WITH DIFFERENTIAL/PLATELET
Basophils Absolute: 0 10*3/uL (ref 0.0–0.2)
Basos: 0 %
EOS (ABSOLUTE): 0 10*3/uL (ref 0.0–0.4)
Eos: 1 %
Hematocrit: 42.1 % (ref 34.0–46.6)
Hemoglobin: 13.7 g/dL (ref 11.1–15.9)
Immature Grans (Abs): 0 10*3/uL (ref 0.0–0.1)
Immature Granulocytes: 0 %
Lymphocytes Absolute: 1.8 10*3/uL (ref 0.7–3.1)
Lymphs: 33 %
MCH: 30.2 pg (ref 26.6–33.0)
MCHC: 32.5 g/dL (ref 31.5–35.7)
MCV: 93 fL (ref 79–97)
Monocytes Absolute: 0.5 10*3/uL (ref 0.1–0.9)
Monocytes: 9 %
Neutrophils Absolute: 3 10*3/uL (ref 1.4–7.0)
Neutrophils: 57 %
Platelets: 257 10*3/uL (ref 150–450)
RBC: 4.53 x10E6/uL (ref 3.77–5.28)
RDW: 13.5 % (ref 12.3–15.4)
WBC: 5.3 10*3/uL (ref 3.4–10.8)

## 2018-02-26 LAB — AMYLASE: Amylase: 58 U/L (ref 31–124)

## 2018-02-26 LAB — LIPASE: Lipase: 35 U/L (ref 14–72)

## 2018-02-26 LAB — H. PYLORI BREATH TEST: H pylori Breath Test: NEGATIVE

## 2018-02-27 ENCOUNTER — Telehealth: Payer: Self-pay

## 2018-02-27 NOTE — Telephone Encounter (Signed)
LMTCB 02/27/2018  Thanks,   -Mickel Baas

## 2018-02-27 NOTE — Telephone Encounter (Signed)
Pt advised.   Thanks,   -Prynce Jacober  

## 2018-02-27 NOTE — Telephone Encounter (Signed)
-----   Message from Trinna Post, [place] sent at 02/27/2018  8:56 AM EDT ----- Brooke Mcdowell is all normal. H. Pylori is negative. We can await ultrasound, or may refer to GI as well.

## 2018-02-27 NOTE — Telephone Encounter (Signed)
Noted, thanks!

## 2018-03-01 ENCOUNTER — Telehealth: Payer: Self-pay

## 2018-03-01 ENCOUNTER — Other Ambulatory Visit: Payer: Self-pay | Admitting: Physician Assistant

## 2018-03-01 ENCOUNTER — Ambulatory Visit
Admission: RE | Admit: 2018-03-01 | Discharge: 2018-03-01 | Disposition: A | Discharge status: Home / Self Care | Payer: PPO | Source: Ambulatory Visit | Attending: Physician Assistant | Admitting: Physician Assistant

## 2018-03-01 DIAGNOSIS — R1013 Epigastric pain: Secondary | ICD-10-CM

## 2018-03-01 DIAGNOSIS — R1011 Right upper quadrant pain: Secondary | ICD-10-CM | POA: Insufficient documentation

## 2018-03-01 NOTE — Progress Notes (Signed)
GI referral placed

## 2018-03-01 NOTE — Telephone Encounter (Signed)
-----   Message from Trinna Post, [place] sent at 03/01/2018 10:22 AM EDT ----- Ultrasound showed no gallstones, possibly some early fatty liver but this shouldn't cause pain. Due to new symptoms and age, would recommend GI referral.

## 2018-03-01 NOTE — Telephone Encounter (Signed)
GI referral placed

## 2018-03-01 NOTE — Telephone Encounter (Signed)
Pt advised.  She would like to proceed with the GI referral.   Thanks,   -Mickel Baas

## 2018-03-12 DIAGNOSIS — M5416 Radiculopathy, lumbar region: Secondary | ICD-10-CM | POA: Diagnosis not present

## 2018-03-12 DIAGNOSIS — M5136 Other intervertebral disc degeneration, lumbar region: Secondary | ICD-10-CM | POA: Diagnosis not present

## 2018-03-20 ENCOUNTER — Other Ambulatory Visit: Payer: Self-pay | Admitting: Physician Assistant

## 2018-03-20 DIAGNOSIS — G4700 Insomnia, unspecified: Secondary | ICD-10-CM

## 2018-03-21 MED ORDER — ZOLPIDEM TARTRATE 10 MG PO TABS: 10.0000 mg | ORAL_TABLET | Freq: Every evening | ORAL | 0 refills | Status: DC | PRN

## 2018-03-21 NOTE — Telephone Encounter (Signed)
Pt states she will be leaving to go out of town now and is requesting we send the medication into [facility] in [place], [place].

## 2018-03-21 NOTE — Telephone Encounter (Signed)
Ambien sent to [facility].

## 2018-04-10 ENCOUNTER — Ambulatory Visit: Payer: PPO | Admitting: Gastroenterology

## 2018-04-23 ENCOUNTER — Other Ambulatory Visit: Payer: Self-pay | Admitting: Physician Assistant

## 2018-04-23 DIAGNOSIS — G4700 Insomnia, unspecified: Secondary | ICD-10-CM

## 2018-04-27 ENCOUNTER — Ambulatory Visit (INDEPENDENT_AMBULATORY_CARE_PROVIDER_SITE_OTHER): Payer: PPO | Admitting: Physician Assistant

## 2018-04-27 ENCOUNTER — Encounter: Payer: Self-pay | Admitting: Physician Assistant

## 2018-04-27 VITALS — BP 108/68 | HR 76 | Temp 98.8°F | Resp 16 | Wt 113.0 lb

## 2018-04-27 DIAGNOSIS — R05 Cough: Secondary | ICD-10-CM | POA: Diagnosis not present

## 2018-04-27 DIAGNOSIS — J0101 Acute recurrent maxillary sinusitis: Secondary | ICD-10-CM

## 2018-04-27 DIAGNOSIS — R059 Cough, unspecified: Secondary | ICD-10-CM

## 2018-04-27 MED ORDER — CLARITHROMYCIN 500 MG PO TABS: 500.0000 mg | ORAL_TABLET | Freq: Two times a day (BID) | ORAL | 0 refills | Status: AC

## 2018-04-27 MED ORDER — HYDROCODONE-HOMATROPINE 5-1.5 MG/5ML PO SYRP: 5.0000 mL | ORAL_SOLUTION | Freq: Every evening | ORAL | 0 refills | Status: DC | PRN

## 2018-04-27 NOTE — Patient Instructions (Signed)

## 2018-04-27 NOTE — Progress Notes (Signed)
[place]  Chief Complaint  Patient presents with  . Sinusitis    Started about two weeks ago  . URI    Subjective:    Patient ID: Brooke Mcdowell, female    DOB: 1951/03/03, 67 y.o.   MRN: [id number]  Upper Respiratory Infection: Brooke Mcdowell is a 67 y.o. female with a past medical history significant for recurrent sinusitis, sinus surgery complaining of symptoms of a URI, possible sinusitis. Usually sees Dr. Richardson Landry at [facility]. Symptoms include congestion, cough, plugged sensation in both ears and sore throat. Onset of symptoms was 2 weeks ago, gradually worsening since that time. She also c/o congestion, cough described as productive, nasal congestion, post nasal drip, sinus pressure and sore throat for the past 2 weeks .  She is drinking plenty of fluids. Evaluation to date: none. Treatment to date: decongestants. The treatment has provided no.   Review of Systems  Constitutional: Positive for chills and fatigue. Negative for activity change, appetite change, diaphoresis, fever and unexpected weight change.  HENT: Positive for congestion, postnasal drip, sinus pressure, sinus pain, sore throat and tinnitus. Negative for ear discharge, ear pain, rhinorrhea, trouble swallowing and voice change.   Eyes: Negative.   Respiratory: Positive for cough. Negative for apnea, choking, chest tightness, shortness of breath, wheezing and stridor.   Gastrointestinal: Negative.   Neurological: Positive for headaches. Negative for dizziness and light-headedness.       Objective:   BP 108/68 (BP Location: Right Arm, Patient Position: Sitting, Cuff Size: Normal)   Pulse 76   Temp 98.8 F (37.1 C) (Oral)   Resp 16   Wt 113 lb (51.3 kg)   BMI 21.01 kg/m   Patient Active Problem List   Diagnosis Date Noted  . Anxiety 08/05/2016  . Restless legs 08/05/2016  . Insomnia 08/04/2016  . Esophageal reflux   . Vitamin D deficiency   . Pure  hypercholesterolemia   . Migraine   . Dysthymic disorder   . Arthropathy of hand   . Panic disorder     Outpatient Encounter Medications as of 04/27/2018  Medication Sig  . Biotin 10 MG TABS Take by mouth.  . estradiol (ESTRACE) 1 MG tablet Take one pill daily.  . magnesium 30 MG tablet Take 30 mg by mouth 2 (two) times daily.  Marland Kitchen omeprazole (PRILOSEC) 20 MG capsule Take 20 mg by mouth daily.  . rizatriptan (MAXALT) 10 MG tablet Take 1 tablet for migraine. May repeat in 2 hours if needed. Maximum 30 mg daily.  . sertraline (ZOLOFT) 50 MG tablet Take 1 tablet (50 mg total) by mouth daily.  . traMADol (ULTRAM) 50 MG tablet TK 1/2 TO 1 T PO BID PRN  . zolpidem (AMBIEN) 10 MG tablet TAKE 1 TABLET(10 MG) BY MOUTH AT BEDTIME AS NEEDED FOR SLEEP  . clarithromycin (BIAXIN) 500 MG tablet Take 1 tablet (500 mg total) by mouth 2 (two) times daily for 10 days.  Marland Kitchen FLUZONE HIGH-DOSE 0.5 ML injection ADM 0.5ML IM UTD  . HYDROcodone-homatropine (HYCODAN) 5-1.5 MG/5ML syrup Take 5 mLs by mouth at bedtime as needed for cough.  . medroxyPROGESTERone (PROVERA) 2.5 MG tablet Take 1 tablet (2.5 mg total) by mouth daily.   No facility-administered encounter medications on file as of 04/27/2018.     Allergies  Allergen Reactions  . Gabapentin Other (See Comments)    Caused migraines  . Tetracyclines & Related Itching and Nausea And Vomiting  . Penicillins Nausea And  Vomiting and Rash  . Sulfa Antibiotics Rash       Physical Exam  Constitutional: She is oriented to person, place, and time. She appears well-developed and well-nourished.  HENT:  Head: Normocephalic and atraumatic.  Right Ear: External ear normal.  Left Ear: External ear normal.  Nose: Right sinus exhibits maxillary sinus tenderness and frontal sinus tenderness. Left sinus exhibits maxillary sinus tenderness and frontal sinus tenderness.  Mouth/Throat: Oropharynx is clear and moist. No oropharyngeal exudate.  Eyes: Conjunctivae are  normal. Right eye exhibits no discharge. Left eye exhibits no discharge.  Neck: Neck supple.  Cardiovascular: Normal rate and regular rhythm.  Pulmonary/Chest: Effort normal and breath sounds normal.  Lymphadenopathy:    She has no cervical adenopathy.  Neurological: She is alert and oriented to person, place, and time.  Skin: Skin is warm and dry.  Psychiatric: She has a normal mood and affect. Her behavior is normal.       Assessment & Plan:   1. Acute recurrent maxillary sinusitis  - clarithromycin (BIAXIN) 500 MG tablet; Take 1 tablet (500 mg total) by mouth 2 (two) times daily for 10 days.  Dispense: 20 tablet; Refill: 0 - HYDROcodone-homatropine (HYCODAN) 5-1.5 MG/5ML syrup; Take 5 mLs by mouth at bedtime as needed for cough.  Dispense: 120 mL; Refill: 0  2. Cough  - HYDROcodone-homatropine (HYCODAN) 5-1.5 MG/5ML syrup; Take 5 mLs by mouth at bedtime as needed for cough.  Dispense: 120 mL; Refill: 0   Patient Instructions  Sinusitis, Adult Sinusitis is soreness and inflammation of your sinuses. Sinuses are hollow spaces in the bones around your face. They are located:  Around your eyes.  In the middle of your forehead.  Behind your nose.  In your cheekbones.  Your sinuses and nasal passages are lined with a stringy fluid (mucus). Mucus normally drains out of your sinuses. When your nasal tissues get inflamed or swollen, the mucus can get trapped or blocked so air cannot flow through your sinuses. This lets bacteria, viruses, and funguses grow, and that leads to infection. Follow these instructions at home: Medicines  Take, use, or apply over-the-counter and prescription medicines only as told by your doctor. These may include nasal sprays.  If you were prescribed an antibiotic medicine, take it as told by your doctor. Do not stop taking the antibiotic even if you start to feel better. Hydrate and Humidify  Drink enough water to keep your pee (urine) clear or pale  yellow.  Use a cool mist humidifier to keep the humidity level in your home above 50%.  Breathe in steam for 10-15 minutes, 3-4 times a day or as told by your doctor. You can do this in the bathroom while a hot shower is running.  Try not to spend time in cool or dry air. Rest  Rest as much as possible.  Sleep with your head raised (elevated).  Make sure to get enough sleep each night. General instructions  Put a warm, moist washcloth on your face 3-4 times a day or as told by your doctor. This will help with discomfort.  Wash your hands often with soap and water. If there is no soap and water, use hand sanitizer.  Do not smoke. Avoid being around people who are smoking (secondhand smoke).  Keep all follow-up visits as told by your doctor. This is important. Contact a doctor if:  You have a fever.  Your symptoms get worse.  Your symptoms do not get better within 10 days.  Get help right away if:  You have a very bad headache.  You cannot stop throwing up (vomiting).  You have pain or swelling around your face or eyes.  You have trouble seeing.  You feel confused.  Your neck is stiff.  You have trouble breathing. This information is not intended to replace advice given to you by your health care provider. Make sure you discuss any questions you have with your health care provider. Document Released: 03/07/2008 Document Revised: 05/15/2016 Document Reviewed: 07/15/2015 Elsevier Interactive Patient Education  [employer].     The entirety of the information documented in the History of Present Illness, Review of Systems and Physical Exam were personally obtained by me. Portions of this information were initially documented by Ashley Royalty, CMA and reviewed by me for thoroughness and accuracy.

## 2018-05-21 ENCOUNTER — Ambulatory Visit (INDEPENDENT_AMBULATORY_CARE_PROVIDER_SITE_OTHER): Payer: PPO | Admitting: Gastroenterology

## 2018-05-21 ENCOUNTER — Other Ambulatory Visit: Payer: Self-pay | Admitting: Physician Assistant

## 2018-05-21 ENCOUNTER — Encounter: Payer: Self-pay | Admitting: Gastroenterology

## 2018-05-21 VITALS — BP 114/71 | HR 65 | Temp 98.9°F | Ht 61.5 in | Wt 113.4 lb

## 2018-05-21 DIAGNOSIS — R1084 Generalized abdominal pain: Secondary | ICD-10-CM | POA: Diagnosis not present

## 2018-05-21 DIAGNOSIS — K5900 Constipation, unspecified: Secondary | ICD-10-CM

## 2018-05-21 DIAGNOSIS — G4700 Insomnia, unspecified: Secondary | ICD-10-CM

## 2018-05-21 DIAGNOSIS — Z791 Long term (current) use of non-steroidal anti-inflammatories (NSAID): Secondary | ICD-10-CM

## 2018-05-21 DIAGNOSIS — K760 Fatty (change of) liver, not elsewhere classified: Secondary | ICD-10-CM | POA: Diagnosis not present

## 2018-05-21 DIAGNOSIS — R140 Abdominal distension (gaseous): Secondary | ICD-10-CM | POA: Diagnosis not present

## 2018-05-21 MED ORDER — OMEPRAZOLE 40 MG PO CPDR
40.0000 mg | DELAYED_RELEASE_CAPSULE | Freq: Every day | ORAL | 3 refills | Status: DC
Start: 2018-05-21 — End: 2019-08-06

## 2018-05-21 NOTE — Patient Instructions (Signed)
You were given samples of Linzess 176mcg. Please take once daily as directed by Dr. Vicente Males. You if see positive results and would like a prescription, please contact our office.

## 2018-05-21 NOTE — Progress Notes (Signed)
Jonathon Bellows MD, MRCP(U.K) [address]  [place]  [place], [place] [address]  Main: [phone]  Fax: [phone]   Gastroenterology Consultation  Referring Provider:     Paulene Floor Primary Care Physician:  Paulene Floor Primary Gastroenterologist:  Dr. Jonathon Bellows  Reason for Consultation:     Abdominal pain         HPI:   Brooke Mcdowell is a 67 y.o. y/o female referred for consultation & management  by Dr. Terrilee Croak, Wendee Beavers, PA-C.     Abdominal pain: Onset: All began for many years , every day , sometimes all day  Site :all over her abdomen  Radiation: to her back , at times feels like her kidney stones Severity :3/10  Petra Kuba of pain: burning  Aggravating factors: when she has not had a bowel movement or when she is hungry , when she eats then gets burning sensation  Relieving factors :bowel movement  Weight loss: no change -probably gained  NSAID use: On Ibuprofen : not every day : when she does more than once a week , atleast 2-3 times a week , 4 tablets at a time  PPI use :she has prilosec but - has not really taken it regularly  Gall bladder surgery: intact  Frequency of bowel movements: once every day or other day - takes 1000 mg every night , says helps. Her , can go upto 4 days without a bowel movement  Change in bowel movements: always been an issue.  Relief with bowel movements: yes  Gas/Bloating/Abdominal distension: bloated when she eats , worse when not had a bowel movement .   Drinks Dr Malachi Bonds daily, glass of sweet tea daily , buys one with sweetners.    H pylori breath test negative 01/2018   Last colonoscopy in 2012 was normal and repeat suggested in 10 years.   Labs 01/2018- CBC,CMP,Lipase-normal  03/01/2018: RUQ USG- fatty liver.   Past Medical History:  Diagnosis Date  . Arthropathy of hand   . Dysthymic disorder   . Esophageal reflux   . Insomnia   . Lumbar herniated disc   . Migraine   . Panic disorder   . Pure  hypercholesterolemia   . Vitamin D deficiency     Past Surgical History:  Procedure Laterality Date  . clot removal from right sinus after surgery to remove tumor in 1995    . LITHOTRIPSY  [id number]   renal stones  . TUBAL LIGATION    . TUMOR REMOVAL     behind right ete in sinuses- 1995 Dr. Pryor Ochoa    Prior to Admission medications   Medication Sig Start Date End Date Taking? Authorizing Provider  ALPRAZolam Duanne Moron) 0.25 MG tablet Take by mouth. 11/10/14  Yes [provider]  Biotin 10 MG TABS Take by mouth.    [provider]  estradiol (ESTRACE) 1 MG tablet Take one pill daily. 09/13/17   Trinna Post, PA-C  FLUZONE HIGH-DOSE 0.5 ML injection ADM 0.5ML IM UTD 07/20/17   [provider]  HYDROcodone-homatropine (HYCODAN) 5-1.5 MG/5ML syrup Take 5 mLs by mouth at bedtime as needed for cough. 04/27/18   Trinna Post, PA-C  magnesium 30 MG tablet Take 30 mg by mouth 2 (two) times daily.    [provider]  medroxyPROGESTERone (PROVERA) 2.5 MG tablet Take 1 tablet (2.5 mg total) by mouth daily. 09/13/17 12/12/17  Trinna Post, PA-C  omeprazole (PRILOSEC) 20 MG capsule Take  20 mg by mouth daily.    [provider]  rizatriptan (MAXALT) 10 MG tablet Take 1 tablet for migraine. May repeat in 2 hours if needed. Maximum 30 mg daily. 09/12/17   Trinna Post, PA-C  sertraline (ZOLOFT) 50 MG tablet Take 1 tablet (50 mg total) by mouth daily. 01/12/18 07/11/18  Trinna Post, PA-C  traMADol (ULTRAM) 50 MG tablet TK 1/2 TO 1 T PO BID PRN 06/27/17   [provider]  zolpidem (AMBIEN) 10 MG tablet TAKE 1 TABLET(10 MG) BY MOUTH AT BEDTIME AS NEEDED FOR SLEEP 04/23/18   Trinna Post, PA-C    Family History  Problem Relation Age of Onset  . Brain cancer Mother   . Cancer Mother        lung  . Cancer Father        lung   . Brain cancer Father      Social History   Tobacco Use  . Smoking status: Never Smoker  .  Smokeless tobacco: Never Used  Substance Use Topics  . Alcohol use: Yes    Comment: rarely  . Drug use: No    Allergies as of 05/21/2018 - Review Complete 04/27/2018  Allergen Reaction Noted  . Gabapentin Other (See Comments) 11/01/2016  . Tetracyclines & related Itching and Nausea And Vomiting 08/04/2016  . Penicillins Nausea And Vomiting and Rash 08/04/2016  . Sulfa antibiotics Rash 08/04/2016    Review of Systems:    All systems reviewed and negative except where noted in HPI.   Physical Exam:  There were no vitals taken for this visit. No LMP recorded. Patient is postmenopausal. Psych:  Alert and cooperative. Normal mood and affect. General:   Alert,  Well-developed, well-nourished, pleasant and cooperative in NAD Head:  Normocephalic and atraumatic. Eyes:  Sclera clear, no icterus.   Conjunctiva pink. Ears:  Normal auditory acuity. Nose:  No deformity, discharge, or lesions. Mouth:  No deformity or lesions,oropharynx pink & moist. Neck:  Supple; no masses or thyromegaly. Lungs:  Respirations even and unlabored.  Clear throughout to auscultation.   No wheezes, crackles, or rhonchi. No acute distress. Heart:  Regular rate and rhythm; no murmurs, clicks, rubs, or gallops. Abdomen:  Normal bowel sounds.  No bruits.  Soft, non-tender and non-distended without masses, hepatosplenomegaly or hernias noted.  No guarding or rebound tenderness.    Neurologic:  Alert and oriented x3;  grossly normal neurologically. Skin:  Intact without significant lesions or rashes. No jaundice. Lymph Nodes:  No significant cervical adenopathy. Psych:  Alert and cooperative. Normal mood and affect.  Imaging Studies: No results found.  Assessment and Plan:   Brooke Mcdowell is a 67 y.o. y/o female has been referred for abdominal pain. Based on her history I think she may have the pain from a combination of effects of long term NSAID use, constipation, lack of fiber in her diet , bloating from high  fructose in her diet   1. High fiber diet - patient information provided.  2. Trial of Linzess 145 mcg daily  3. Stop all NSAID's 4. Prilosec 40 mg once daily 5. If no better at next visit will consider EGD+/- CT abdomen , celiac serology  6. NAFLD: counseled on life style changes, provided patient informaiton   Follow up in 6-8 weeks   Dr Jonathon Bellows MD,MRCP(U.K)

## 2018-06-14 ENCOUNTER — Ambulatory Visit: Payer: Self-pay | Admitting: Physician Assistant

## 2018-06-15 ENCOUNTER — Ambulatory Visit (INDEPENDENT_AMBULATORY_CARE_PROVIDER_SITE_OTHER): Payer: PPO

## 2018-06-15 ENCOUNTER — Encounter: Payer: Self-pay | Admitting: Family Medicine

## 2018-06-15 ENCOUNTER — Ambulatory Visit (INDEPENDENT_AMBULATORY_CARE_PROVIDER_SITE_OTHER): Payer: PPO | Admitting: Family Medicine

## 2018-06-15 VITALS — Wt 114.2 lb

## 2018-06-15 VITALS — BP 104/54 | HR 62 | Temp 98.7°F | Ht 62.0 in | Wt 114.4 lb

## 2018-06-15 DIAGNOSIS — Z0000 Encounter for general adult medical examination without abnormal findings: Secondary | ICD-10-CM

## 2018-06-15 DIAGNOSIS — J0101 Acute recurrent maxillary sinusitis: Secondary | ICD-10-CM | POA: Diagnosis not present

## 2018-06-15 DIAGNOSIS — J309 Allergic rhinitis, unspecified: Secondary | ICD-10-CM

## 2018-06-15 MED ORDER — AMOXICILLIN-POT CLAVULANATE 875-125 MG PO TABS: 1.0000 | ORAL_TABLET | Freq: Two times a day (BID) | ORAL | 0 refills | Status: AC

## 2018-06-15 MED ORDER — FLUTICASONE PROPIONATE 50 MCG/ACT NA SUSP: 2.0000 | Freq: Every day | NASAL | 6 refills | Status: DC

## 2018-06-15 NOTE — Patient Instructions (Signed)

## 2018-06-15 NOTE — Progress Notes (Signed)
Subjective:   Brooke Mcdowell is a 67 y.o. female who presents for Medicare Annual (Subsequent) preventive examination.  Review of Systems:  N/A  Cardiac Risk Factors include: advanced age (>12men, >23 women)     Objective:     Vitals: BP (!) 104/54 (BP Location: Right Arm)   Pulse 62   Temp 98.7 F (37.1 C) (Oral)   Ht 5\' 2"  (1.575 m)   Wt 114 lb 6.4 oz (51.9 kg)   BMI 20.92 kg/m   Body mass index is 20.92 kg/m.  Advanced Directives 06/15/2018 06/13/2017  Does Patient Have a Medical Advance Directive? Yes Yes  Type of Paramedic of [place];Living will Living will  Copy of [place] in Chart? No - copy requested -    Tobacco Social History   Tobacco Use  Smoking Status Never Smoker  Smokeless Tobacco Never Used     Counseling given: Not Answered   Clinical Intake:  Pre-visit preparation completed: Yes  Pain : No/denies pain Pain Score: 0-No pain     Nutritional Status: BMI of 19-24  Normal Nutritional Risks: None Diabetes: No  How often do you need to have someone help you when you read instructions, pamphlets, or other written materials from your doctor or pharmacy?: 1 - Never  Interpreter Needed?: No  Information entered by :: MMarkoski, LPN  Past Medical History:  Diagnosis Date  . Arthropathy of hand   . Dysthymic disorder   . Esophageal reflux   . Insomnia   . Lumbar herniated disc   . Migraine   . Panic disorder   . Pure hypercholesterolemia   . Vitamin D deficiency    Past Surgical History:  Procedure Laterality Date  . clot removal from right sinus after surgery to remove tumor in 1995    . LITHOTRIPSY  [phone]   renal stones  . TUBAL LIGATION    . TUMOR REMOVAL     behind right ete in sinuses- 1995 Dr. Pryor Ochoa   Family History  Problem Relation Age of Onset  . Brain cancer Mother   . Cancer Mother        lung  . Cancer Father        lung   . Brain cancer Father    Social  History   Socioeconomic History  . Marital status: Divorced    Spouse name: Not on file  . Number of children: 2  . Years of education: Not on file  . Highest education level: Some college, no degree  Occupational History  . Occupation: retired  Scientific laboratory technician  . Financial resource strain: Not hard at all  . Food insecurity:    Worry: Never true    Inability: Never true  . Transportation needs:    Medical: No    Non-medical: No  Tobacco Use  . Smoking status: Never Smoker  . Smokeless tobacco: Never Used  Substance and Sexual Activity  . Alcohol use: Yes    Comment: rarely - 1/2 drinks @ a time  . Drug use: No  . Sexual activity: Not on file  Lifestyle  . Physical activity:    Days per week: Not on file    Minutes per session: Not on file  . Stress: Not at all  Relationships  . Social connections:    Talks on phone: Not on file    Gets together: Not on file    Attends religious service: Not on file    Active  member of club or organization: Not on file    Attends meetings of clubs or organizations: Not on file    Relationship status: Not on file  Other Topics Concern  . Not on file  Social History Narrative  . Not on file    Outpatient Encounter Medications as of 06/15/2018  Medication Sig  . Biotin 10 MG TABS Take by mouth daily.   Marland Kitchen estradiol (ESTRACE) 1 MG tablet Take one pill daily.  Marland Kitchen guaiFENesin (MUCINEX PO) Take by mouth daily. As needed  . Magnesium 250 MG TABS Take 500 mg by mouth daily.  . magnesium 30 MG tablet Take 30 mg by mouth 2 (two) times daily.  . medroxyPROGESTERone (PROVERA) 2.5 MG tablet Take 1 tablet (2.5 mg total) by mouth daily.  Marland Kitchen omeprazole (PRILOSEC) 40 MG capsule Take 1 capsule (40 mg total) by mouth daily.  . rizatriptan (MAXALT) 10 MG tablet Take 1 tablet for migraine. May repeat in 2 hours if needed. Maximum 30 mg daily. (Patient taking differently: Take 5 mg by mouth as needed. Take 1 tablet for migraine. May repeat in 2 hours if  needed. Maximum 30 mg daily.)  . sertraline (ZOLOFT) 50 MG tablet Take 1 tablet (50 mg total) by mouth daily.  . traMADol (ULTRAM) 50 MG tablet TK 1/2 TO 1 T PO BID PRN  . zolpidem (AMBIEN) 10 MG tablet TAKE 1 TABLET(10 MG) BY MOUTH AT BEDTIME AS NEEDED FOR SLEEP  . ALPRAZolam (XANAX) 0.25 MG tablet Take by mouth.  Marland Kitchen FLUZONE HIGH-DOSE 0.5 ML injection ADM 0.5ML IM UTD  . HYDROcodone-homatropine (HYCODAN) 5-1.5 MG/5ML syrup Take 5 mLs by mouth at bedtime as needed for cough. (Patient not taking: Reported on 05/21/2018)   No facility-administered encounter medications on file as of 06/15/2018.     Activities of Daily Living In your present state of health, do you have any difficulty performing the following activities: 06/15/2018 09/22/2017  Hearing? N N  Vision? Y N  Comment Due to catacts in both eyes.  -  Difficulty concentrating or making decisions? N N  Walking or climbing stairs? N N  Dressing or bathing? N N  Doing errands, shopping? N N  Preparing Food and eating ? N -  Using the Toilet? N -  In the past six months, have you accidently leaked urine? N -  Do you have problems with loss of bowel control? N -  Managing your Medications? N -  Managing your Finances? N -  Housekeeping or managing your Housekeeping? N -  Some recent data might be hidden    Patient Care Team: Paulene Floor as PCP - General (Physician Assistant) Ronnell Freshwater, MD as Referring Physician (Ophthalmology) Sharlet Salina, MD as Referring Physician (Physical Medicine and Rehabilitation) Clyde Canterbury, MD as Referring Physician (Otolaryngology)    Assessment:   This is a routine wellness examination for [name].  Exercise Activities and Dietary recommendations Current Exercise Habits: Structured exercise class, Type of exercise: walking;treadmill;strength training/weights;stretching, Time (Minutes): 60(or more), Frequency (Times/Week): 3, Weekly Exercise (Minutes/Week): 180,  Intensity: Mild, Exercise limited by: None identified  Goals    . Increase water intake     Recommend increasing water intake to 4-6 glasses a day.        Fall Risk Fall Risk  06/15/2018 06/13/2017  Falls in the past year? No No   Is the patient's home free of loose throw rugs in walkways, pet beds, electrical cords, etc?   yes  Grab bars in the bathroom? no      Handrails on the stairs?   yes      Adequate lighting?   yes  Timed Get Up and Go performed: N/A  Depression Screen PHQ 2/9 Scores 06/15/2018 09/22/2017 06/13/2017 06/13/2017  PHQ - 2 Score 0 0 0 0  PHQ- 9 Score - 4 2 -     Cognitive Function: Pt declined screening today.      6CIT Screen 06/13/2017  What Year? 0 points  What month? 0 points  What time? 0 points  Count back from 20 0 points  Months in reverse 0 points  Repeat phrase 4 points  Total Score 4    Immunization History  Administered Date(s) Administered  . Influenza, High Dose Seasonal PF 07/20/2017  . Pneumococcal Conjugate-13 06/13/2017  . Tdap 03/17/2017  . Zoster 04/26/2013    Qualifies for Shingles Vaccine? Due for Shingles vaccine. Declined my offer to administer today. Education has been provided regarding the importance of this vaccine. Pt has been advised to call her insurance company to determine her out of pocket expense. Advised she may also receive this vaccine at her local pharmacy or Health Dept. Verbalized acceptance and understanding.  Screening Tests Health Maintenance  Topic Date Due  . INFLUENZA VACCINE  05/03/2018  . PNA vac Low Risk Adult (2 of 2 - PPSV23) 06/13/2018  . MAMMOGRAM  08/23/2019  . COLONOSCOPY  12/19/2020  . TETANUS/TDAP  03/18/2027  . DEXA SCAN  Completed  . Hepatitis C Screening  Completed    Cancer Screenings: Lung: Low Dose CT Chest recommended if Age 80-80 years, 30 pack-year currently smoking OR have quit w/in 15years. Patient does not qualify. Breast:  Up to date on Mammogram? Yes   Up to date  of Bone Density/Dexa? Yes Colorectal: Up to date  Additional Screenings:  Hepatitis C Screening: Up to date     Plan:  I have personally reviewed and addressed the Medicare Annual Wellness questionnaire and have noted the following in the patient's chart:  A. Medical and social history B. Use of alcohol, tobacco or illicit drugs  C. Current medications and supplements D. Functional ability and status E.  Nutritional status F.  Physical activity G. Advance directives H. List of other physicians I.  Hospitalizations, surgeries, and ER visits in previous 12 months J.  [place] such as hearing and vision if needed, cognitive and depression L. Referrals and appointments - none  In addition, I have reviewed and discussed with patient certain preventive protocols, quality metrics, and best practice recommendations. A written personalized care plan for preventive services as well as general preventive health recommendations were provided to patient.  See attached scanned questionnaire for additional information.   Signed,  Fabio Neighbors, LPN Nurse Health Advisor   Nurse Recommendations: Pt declined the influenza and pneumonia vaccines today due to not feeling well. Acute visit scheduled with Dr B at 10:40 today.

## 2018-06-15 NOTE — Patient Instructions (Addendum)
Brooke Mcdowell , Thank you for taking time to come for your Medicare Wellness Visit. I appreciate your ongoing commitment to your health goals. Please review the following plan we discussed and let me know if I can assist you in the future.   Screening recommendations/referrals: Colonoscopy: Up to date Mammogram: Up to date Bone Density: Up to date Recommended yearly ophthalmology/optometry visit for glaucoma screening and checkup Recommended yearly dental visit for hygiene and checkup  Vaccinations: Influenza vaccine: Pt declines today.  Pneumococcal vaccine: Pt declines today.  Tdap vaccine: Up to date Shingles vaccine: Pt declines today.     Advanced directives: Please bring a copy of your POA (Power of Attorney) and/or Living Will to your next appointment.   Conditions/risks identified: Continue to work to increase water intake to at least 4 glasses of day.   Next appointment: 09/13/18 @ 10 AM with [name].    Preventive Care 68 Years and Older, Female Preventive care refers to lifestyle choices and visits with your health care provider that can promote health and wellness. What does preventive care include?  A yearly physical exam. This is also called an annual well check.  Dental exams once or twice a year.  Routine eye exams. Ask your health care provider how often you should have your eyes checked.  Personal lifestyle choices, including:  Daily care of your teeth and gums.  Regular physical activity.  Eating a healthy diet.  Avoiding tobacco and drug use.  Limiting alcohol use.  Practicing safe sex.  Taking low-dose aspirin every day.  Taking vitamin and mineral supplements as recommended by your health care provider. What happens during an annual well check? The services and screenings done by your health care provider during your annual well check will depend on your age, overall health, lifestyle risk factors, and family history of disease. Counseling    Your health care provider may ask you questions about your:  Alcohol use.  Tobacco use.  Drug use.  Emotional well-being.  Home and relationship well-being.  Sexual activity.  Eating habits.  History of falls.  Memory and ability to understand (cognition).  Work and work Statistician.  Reproductive health. Screening  You may have the following tests or measurements:  Height, weight, and BMI.  Blood pressure.  Lipid and cholesterol levels. These may be checked every 5 years, or more frequently if you are over 73 years old.  Skin check.  Lung cancer screening. You may have this screening every year starting at age 81 if you have a 30-pack-year history of smoking and currently smoke or have quit within the past 15 years.  Fecal occult blood test (FOBT) of the stool. You may have this test every year starting at age 67.  Flexible sigmoidoscopy or colonoscopy. You may have a sigmoidoscopy every 5 years or a colonoscopy every 10 years starting at age 90.  Hepatitis C blood test.  Hepatitis B blood test.  Sexually transmitted disease (STD) testing.  Diabetes screening. This is done by checking your blood sugar (glucose) after you have not eaten for a while (fasting). You may have this done every 1-3 years.  Bone density scan. This is done to screen for osteoporosis. You may have this done starting at age 5.  Mammogram. This may be done every 1-2 years. Talk to your health care provider about how often you should have regular mammograms. Talk with your health care provider about your test results, treatment options, and if necessary, the need for more  tests. Vaccines  Your health care provider may recommend certain vaccines, such as:  Influenza vaccine. This is recommended every year.  Tetanus, diphtheria, and acellular pertussis (Tdap, Td) vaccine. You may need a Td booster every 10 years.  Zoster vaccine. You may need this after age 26.  Pneumococcal 13-valent  conjugate (PCV13) vaccine. One dose is recommended after age 32.  Pneumococcal polysaccharide (PPSV23) vaccine. One dose is recommended after age 18. Talk to your health care provider about which screenings and vaccines you need and how often you need them. This information is not intended to replace advice given to you by your health care provider. Make sure you discuss any questions you have with your health care provider. Document Released: 10/16/2015 Document Revised: 06/08/2016 Document Reviewed: 07/21/2015 Elsevier Interactive Patient Education  2017 [place] Prevention in the Home Falls can cause injuries. They can happen to people of all ages. There are many things you can do to make your home safe and to help prevent falls. What can I do on the outside of my home?  Regularly fix the edges of walkways and driveways and fix any cracks.  Remove anything that might make you trip as you walk through a door, such as a raised step or threshold.  Trim any bushes or trees on the path to your home.  Use bright outdoor lighting.  Clear any walking paths of anything that might make someone trip, such as rocks or tools.  Regularly check to see if handrails are loose or broken. Make sure that both sides of any steps have handrails.  Any raised decks and porches should have guardrails on the edges.  Have any leaves, snow, or ice cleared regularly.  Use sand or salt on walking paths during winter.  Clean up any spills in your garage right away. This includes oil or grease spills. What can I do in the bathroom?  Use night lights.  Install grab bars by the toilet and in the tub and shower. Do not use towel bars as grab bars.  Use non-skid mats or decals in the tub or shower.  If you need to sit down in the shower, use a plastic, non-slip stool.  Keep the floor dry. Clean up any water that spills on the floor as soon as it happens.  Remove soap buildup in the tub or  shower regularly.  Attach bath mats securely with double-sided non-slip rug tape.  Do not have throw rugs and other things on the floor that can make you trip. What can I do in the bedroom?  Use night lights.  Make sure that you have a light by your bed that is easy to reach.  Do not use any sheets or blankets that are too big for your bed. They should not hang down onto the floor.  Have a firm chair that has side arms. You can use this for support while you get dressed.  Do not have throw rugs and other things on the floor that can make you trip. What can I do in the kitchen?  Clean up any spills right away.  Avoid walking on wet floors.  Keep items that you use a lot in easy-to-reach places.  If you need to reach something above you, use a strong step stool that has a grab bar.  Keep electrical cords out of the way.  Do not use floor polish or wax that makes floors slippery. If you must use wax, use non-skid floor  wax.  Do not have throw rugs and other things on the floor that can make you trip. What can I do with my stairs?  Do not leave any items on the stairs.  Make sure that there are handrails on both sides of the stairs and use them. Fix handrails that are broken or loose. Make sure that handrails are as long as the stairways.  Check any carpeting to make sure that it is firmly attached to the stairs. Fix any carpet that is loose or worn.  Avoid having throw rugs at the top or bottom of the stairs. If you do have throw rugs, attach them to the floor with carpet tape.  Make sure that you have a light switch at the top of the stairs and the bottom of the stairs. If you do not have them, ask someone to add them for you. What else can I do to help prevent falls?  Wear shoes that:  Do not have high heels.  Have rubber bottoms.  Are comfortable and fit you well.  Are closed at the toe. Do not wear sandals.  If you use a stepladder:  Make sure that it is fully  opened. Do not climb a closed stepladder.  Make sure that both sides of the stepladder are locked into place.  Ask someone to hold it for you, if possible.  Clearly mark and make sure that you can see:  Any grab bars or handrails.  First and last steps.  Where the edge of each step is.  Use tools that help you move around (mobility aids) if they are needed. These include:  Canes.  Walkers.  Scooters.  Crutches.  Turn on the lights when you go into a dark area. Replace any light bulbs as soon as they burn out.  Set up your furniture so you have a clear path. Avoid moving your furniture around.  If any of your floors are uneven, fix them.  If there are any pets around you, be aware of where they are.  Review your medicines with your doctor. Some medicines can make you feel dizzy. This can increase your chance of falling. Ask your doctor what other things that you can do to help prevent falls. This information is not intended to replace advice given to you by your health care provider. Make sure you discuss any questions you have with your health care provider. Document Released: 07/16/2009 Document Revised: 02/25/2016 Document Reviewed: 10/24/2014 Elsevier Interactive Patient Education  2017 [employer].

## 2018-06-15 NOTE — Progress Notes (Signed)
Patient: Brooke Mcdowell Female    DOB: 1951-01-27   67 y.o.   MRN: [id number] Visit Date: 06/15/2018  Today's Provider: Lavon Paganini, MD   Chief Complaint  Patient presents with  . URI   Subjective:    URI   This is a new problem. The current episode started more than 1 month ago. The problem has been gradually worsening. Maximum temperature: Reports that she runs a low grade fever every day of 99.2-99.7. Associated symptoms include congestion, coughing, headaches, a plugged ear sensation, sinus pain and a sore throat. Pertinent negatives include no chest pain, ear pain, rhinorrhea, vomiting or wheezing. She has tried decongestant (Mucinex and Tylenol) for the symptoms. The treatment provided no relief.   +Postnasal drip  She has always thought that she had bad seasonal allergies.  Some years ago, however, she took a blood test for allergens and was told she was allergic to anything.  Therefore, she does not take any antihistamines.  She was seen in 04/2018 for similar symptoms and treated for sinusitis with erythromycin.  She does have a penicillin allergy listed in her chart, but she states this was only a mild rash when she was a child and she thinks she may have taken other similar thing since without problem.  She would like to try Augmentin if possible as she did not feel clarithromycin helped her symptoms last time.     Allergies  Allergen Reactions  . Gabapentin Other (See Comments)    Caused migraines  . Tetracyclines & Related Itching and Nausea And Vomiting  . Penicillins Nausea And Vomiting and Rash  . Sulfa Antibiotics Rash     Current Outpatient Medications:  .  ALPRAZolam (XANAX) 0.25 MG tablet, Take by mouth., Disp: , Rfl:  .  Biotin 10 MG TABS, Take by mouth daily. , Disp: , Rfl:  .  estradiol (ESTRACE) 1 MG tablet, Take one pill daily., Disp: 90 tablet, Rfl: 0 .  FLUZONE HIGH-DOSE 0.5 ML injection, ADM 0.5ML IM UTD, Disp: , Rfl: 0 .  guaiFENesin  (MUCINEX PO), Take by mouth daily. As needed, Disp: , Rfl:  .  HYDROcodone-homatropine (HYCODAN) 5-1.5 MG/5ML syrup, Take 5 mLs by mouth at bedtime as needed for cough. (Patient not taking: Reported on 05/21/2018), Disp: 120 mL, Rfl: 0 .  Magnesium 250 MG TABS, Take 500 mg by mouth daily., Disp: , Rfl:  .  magnesium 30 MG tablet, Take 30 mg by mouth 2 (two) times daily., Disp: , Rfl:  .  medroxyPROGESTERone (PROVERA) 2.5 MG tablet, Take 1 tablet (2.5 mg total) by mouth daily., Disp: 30 tablet, Rfl: 2 .  omeprazole (PRILOSEC) 40 MG capsule, Take 1 capsule (40 mg total) by mouth daily., Disp: 90 capsule, Rfl: 3 .  rizatriptan (MAXALT) 10 MG tablet, Take 1 tablet for migraine. May repeat in 2 hours if needed. Maximum 30 mg daily. (Patient taking differently: Take 5 mg by mouth as needed. Take 1 tablet for migraine. May repeat in 2 hours if needed. Maximum 30 mg daily.), Disp: 10 tablet, Rfl: 3 .  sertraline (ZOLOFT) 50 MG tablet, Take 1 tablet (50 mg total) by mouth daily., Disp: 90 tablet, Rfl: 1 .  traMADol (ULTRAM) 50 MG tablet, TK 1/2 TO 1 T PO BID PRN, Disp: , Rfl: 2 .  zolpidem (AMBIEN) 10 MG tablet, TAKE 1 TABLET(10 MG) BY MOUTH AT BEDTIME AS NEEDED FOR SLEEP, Disp: 30 tablet, Rfl: 0  Review of Systems  Constitutional: Negative for chills.  HENT: Positive for congestion, postnasal drip, sinus pressure, sinus pain, sore throat and voice change. Negative for ear pain, rhinorrhea and trouble swallowing.   Respiratory: Positive for cough. Negative for choking, shortness of breath and wheezing.   Cardiovascular: Negative for chest pain, palpitations and leg swelling.  Gastrointestinal: Negative for vomiting.  Musculoskeletal: Negative.   Neurological: Positive for headaches.    Social History   Tobacco Use  . Smoking status: Never Smoker  . Smokeless tobacco: Never Used  Substance Use Topics  . Alcohol use: Yes    Comment: rarely - 1/2 drinks @ a time   Objective:  Vitals: BP (!) 104/54  (BP Location: Right Arm)   Pulse 62   Temp 98.7 F (37.1 C) (Oral)   Ht 5\' 2"  (1.575 m)   Wt 114 lb 6.4 oz (51.9 kg)   BMI 20.92 kg/m   Body mass index is 20.92 kg/m.  Physical Exam  Constitutional: She is oriented to person, place, and time. She appears well-developed and well-nourished. No distress.  Frequent throat clearing  HENT:  Head: Normocephalic and atraumatic.  Right Ear: Tympanic membrane, external ear and ear canal normal.  Left Ear: Tympanic membrane, external ear and ear canal normal.  Nose: Mucosal edema present. Right sinus exhibits maxillary sinus tenderness. Right sinus exhibits no frontal sinus tenderness. Left sinus exhibits maxillary sinus tenderness. Left sinus exhibits no frontal sinus tenderness.  Mouth/Throat: Uvula is midline and mucous membranes are normal. Posterior oropharyngeal erythema present. No oropharyngeal exudate or posterior oropharyngeal edema.  Eyes: Conjunctivae are normal.  Neck: Neck supple. No thyromegaly present.  Cardiovascular: Normal rate, regular rhythm, normal heart sounds and intact distal pulses.  No murmur heard. Pulmonary/Chest: Effort normal and breath sounds normal. No respiratory distress. She has no wheezes. She has no rales.  Musculoskeletal: She exhibits no edema.  Lymphadenopathy:    She has no cervical adenopathy.  Neurological: She is alert and oriented to person, place, and time.  Skin: Skin is warm and dry. Capillary refill takes less than 2 seconds. No rash noted.  Psychiatric: She has a normal mood and affect. Her behavior is normal.  Vitals reviewed.      Assessment & Plan:   1. Allergic rhinitis, unspecified seasonality, unspecified trigger -Discussed with patient that IgE levels for seasonal allergens are not very accurate, compared to skin testing -Despite the negative test in the past, she does have clear symptoms of allergic rhinitis -Discussed use of OTC antihistamine and Flonase -A lot of her symptoms  are likely related to her chronic and uncontrolled allergic rhinitis and due to the significant congestion, it is possible she now has a bacterial sinus infection on top of it  2. Acute recurrent maxillary sinusitis - symptoms and exam c/w sinusitis likely secondary to chronic and uncontrolled allergic rhinitis - no evidence of strep pharyngitis, CAP, AOM, or other bacterial infection -Given time course of symptoms, reasonable to treat for possible bacterial infection - Clarithromycin did not help with symptoms in the past -She has allergies to doxycycline and sulfa antibiotics - She has had what seems like a mild reaction to penicillin as a child, possibly-so we jointly agreed after discussing risks and benefits, to give a trial of Augmentin - Discussed strict return precautions and signs of allergic reaction - discussed symptomatic management, natural course, and return precautions   Meds ordered this encounter  Medications  . amoxicillin-clavulanate (AUGMENTIN) 875-125 MG tablet    Sig: Take 1 tablet  by mouth 2 (two) times daily for 7 days.    Dispense:  14 tablet    Refill:  0  . fluticasone (FLONASE) 50 MCG/ACT nasal spray    Sig: Place 2 sprays into both nostrils daily.    Dispense:  16 g    Refill:  6     Return if symptoms worsen or fail to improve.   The entirety of the information documented in the History of Present Illness, Review of Systems and Physical Exam were personally obtained by me. Portions of this information were initially documented by Lyndel Pleasure, CMA and reviewed by me for thoroughness and accuracy.    Virginia Crews, MD, MPH [facility] 06/15/2018 11:07 AM

## 2018-06-22 ENCOUNTER — Other Ambulatory Visit: Payer: Self-pay | Admitting: Physician Assistant

## 2018-06-22 DIAGNOSIS — G4700 Insomnia, unspecified: Secondary | ICD-10-CM

## 2018-07-03 ENCOUNTER — Encounter: Payer: Self-pay | Admitting: Gastroenterology

## 2018-07-03 ENCOUNTER — Ambulatory Visit: Payer: PPO | Admitting: Gastroenterology

## 2018-07-03 VITALS — BP 101/65 | HR 68 | Temp 98.9°F | Ht 61.0 in | Wt 114.6 lb

## 2018-07-03 DIAGNOSIS — Z791 Long term (current) use of non-steroidal anti-inflammatories (NSAID): Secondary | ICD-10-CM

## 2018-07-03 DIAGNOSIS — R1084 Generalized abdominal pain: Secondary | ICD-10-CM

## 2018-07-03 DIAGNOSIS — K5900 Constipation, unspecified: Secondary | ICD-10-CM

## 2018-07-03 NOTE — Progress Notes (Signed)
Jonathon Bellows MD, MRCP(U.K) [address]  [place]  [place], [place] [address]  Main: [phone]  Fax: [phone]   Primary Care Physician: Paulene Floor  Primary Gastroenterologist:  Dr. Jonathon Bellows   Chief Complaint  Patient presents with  . Follow-up    abdominal pain, bloating, constipation    HPI: Brooke Mcdowell is a 67 y.o. female   She is here today to see me for abdominal pain. Began manu years back , every day , all day long at times, 3/10. Worse when hungry or not had a bowel movement . Relieved with a bowel movement , gained weight , long term use of Ibuprofen. Bloating+, consumes a lot of Dr Malachi Bonds , H pylori breath test negative 01/2018 . Last colonoscopy in 2012 was normal and repeat suggested in 10 years.   Labs 01/2018- CBC,CMP,Lipase-normal  03/01/2018: RUQ USG- fatty liver.   Stopped ibuprofen - abdominal pain is better. Cut down on sodas and bloating is better. Did not try linzess - still taking magnesium - says it works, has a bowel movement every other day. On a high fiber diet  .   Weight is stable. Taking prilosec   BP 101/65   Pulse 68   Temp 98.9 F (37.2 C)   Ht 5\' 1"  (1.549 m)   Wt 114 lb 9.6 oz (52 kg)   BMI 21.65 kg/m     Current Outpatient Medications  Medication Sig Dispense Refill  . Biotin 10 MG TABS Take by mouth daily.     Marland Kitchen estradiol (ESTRACE) 1 MG tablet Take one pill daily. 90 tablet 0  . fluticasone (FLONASE) 50 MCG/ACT nasal spray Place 2 sprays into both nostrils daily. 16 g 6  . FLUZONE HIGH-DOSE 0.5 ML injection ADM 0.5ML IM UTD  0  . Magnesium 250 MG TABS Take 500 mg by mouth daily.    . magnesium 30 MG tablet Take 30 mg by mouth 2 (two) times daily.    Marland Kitchen omeprazole (PRILOSEC) 40 MG capsule Take 1 capsule (40 mg total) by mouth daily. 90 capsule 3  . rizatriptan (MAXALT) 10 MG tablet Take 1 tablet for migraine. May repeat in 2 hours if needed. Maximum 30 mg daily. (Patient taking differently: Take 5 mg by  mouth as needed. Take 1 tablet for migraine. May repeat in 2 hours if needed. Maximum 30 mg daily.) 10 tablet 3  . sertraline (ZOLOFT) 50 MG tablet Take 1 tablet (50 mg total) by mouth daily. 90 tablet 1  . traMADol (ULTRAM) 50 MG tablet TK 1/2 TO 1 T PO BID PRN  2  . zolpidem (AMBIEN) 10 MG tablet TAKE 1 TABLET(10 MG) BY MOUTH AT BEDTIME AS NEEDED FOR SLEEP 30 tablet 0  . ALPRAZolam (XANAX) 0.25 MG tablet Take by mouth.    Marland Kitchen FLUAD 0.5 ML SUSY ADM 0.5ML IM UTD  0  . guaiFENesin (MUCINEX PO) Take by mouth daily. As needed    . HYDROcodone-homatropine (HYCODAN) 5-1.5 MG/5ML syrup Take 5 mLs by mouth at bedtime as needed for cough. (Patient not taking: Reported on 05/21/2018) 120 mL 0  . medroxyPROGESTERone (PROVERA) 2.5 MG tablet Take 1 tablet (2.5 mg total) by mouth daily. 30 tablet 2   No current facility-administered medications for this visit.     Allergies as of 07/03/2018 - Review Complete 07/03/2018  Allergen Reaction Noted  . Gabapentin Other (See Comments) 11/01/2016  . Tetracyclines & related Itching and Nausea And Vomiting 08/04/2016  .  Penicillins Nausea And Vomiting and Rash 08/04/2016  . Sulfa antibiotics Rash 08/04/2016    ROS:  General: Negative for anorexia, weight loss, fever, chills, fatigue, weakness. ENT: Negative for hoarseness, difficulty swallowing , nasal congestion. CV: Negative for chest pain, angina, palpitations, dyspnea on exertion, peripheral edema.  Respiratory: Negative for dyspnea at rest, dyspnea on exertion, cough, sputum, wheezing.  GI: See history of present illness. GU:  Negative for dysuria, hematuria, urinary incontinence, urinary frequency, nocturnal urination.  Endo: Negative for unusual weight change.    Physical Examination:   BP 101/65   Pulse 68   Temp 98.9 F (37.2 C)   Ht 5\' 1"  (1.549 m)   Wt 114 lb 9.6 oz (52 kg)   BMI 21.65 kg/m   General: Well-nourished, well-developed in no acute distress.  Eyes: No icterus. Conjunctivae  pink. Mouth: Oropharyngeal mucosa moist and pink , no lesions erythema or exudate. Lungs: Clear to auscultation bilaterally. Non-labored. Heart: Regular rate and rhythm, no murmurs rubs or gallops.  Abdomen: Bowel sounds are normal, nontender, nondistended, no hepatosplenomegaly or masses, no abdominal bruits or hernia , no rebound or guarding.   Neuro: Alert and oriented x 3.  Grossly intact. Skin: Warm and dry, no jaundice.   Psych: Alert and cooperative, normal mood and affect.   Imaging Studies: No results found.  Assessment and Plan:   Brooke Mcdowell is a 67 y.o. y/o female here to follow up  for abdominal pain.Likely secondary to NSAID's related mucosal injury +/- constipation and effects of gas from high fructose corn syrup- symptoms resolved since last visit and stopping nsaid's, fructose.   1. High fiber diet - patient information provided.  2. Stop all NSAID's 3. Prilosec 40 mg once daily complete prescription and then stop    Dr Jonathon Bellows  MD,MRCP [facility]) Follow up in PRN

## 2018-07-23 ENCOUNTER — Other Ambulatory Visit: Payer: Self-pay | Admitting: Family Medicine

## 2018-07-23 DIAGNOSIS — G4700 Insomnia, unspecified: Secondary | ICD-10-CM

## 2018-08-13 ENCOUNTER — Encounter: Payer: Self-pay | Admitting: Physician Assistant

## 2018-08-13 ENCOUNTER — Ambulatory Visit (INDEPENDENT_AMBULATORY_CARE_PROVIDER_SITE_OTHER): Payer: PPO | Admitting: Physician Assistant

## 2018-08-13 VITALS — BP 118/78 | HR 86 | Temp 99.9°F | Wt 113.8 lb

## 2018-08-13 DIAGNOSIS — R05 Cough: Secondary | ICD-10-CM | POA: Diagnosis not present

## 2018-08-13 DIAGNOSIS — R059 Cough, unspecified: Secondary | ICD-10-CM

## 2018-08-13 DIAGNOSIS — J029 Acute pharyngitis, unspecified: Secondary | ICD-10-CM | POA: Diagnosis not present

## 2018-08-13 LAB — POCT RAPID STREP A (OFFICE): Rapid Strep A Screen: NEGATIVE

## 2018-08-13 MED ORDER — HYDROCOD POLST-CPM POLST ER 10-8 MG/5ML PO SUER: 5.0000 mL | Freq: Every evening | ORAL | 0 refills | Status: DC | PRN

## 2018-08-13 MED ORDER — AMOXICILLIN 875 MG PO TABS: 875.0000 mg | ORAL_TABLET | Freq: Two times a day (BID) | ORAL | 0 refills | Status: AC

## 2018-08-13 MED ORDER — PREDNISONE 20 MG PO TABS: 20.0000 mg | ORAL_TABLET | Freq: Every day | ORAL | 0 refills | Status: AC

## 2018-08-13 NOTE — Patient Instructions (Addendum)
Sending in Tussionex    Sore Throat When you have a sore throat, your throat may:  Hurt.  Burn.  Feel irritated.  Feel scratchy.  Many things can cause a sore throat, including:  An infection.  Allergies.  Dryness in the air.  Smoke or pollution.  Gastroesophageal reflux disease (GERD).  A tumor.  A sore throat can be the first sign of another sickness. It can happen with other problems, like coughing or a fever. Most sore throats go away without treatment. Follow these instructions at home:  Take over-the-counter medicines only as told by your doctor.  Drink enough fluids to keep your pee (urine) clear or pale yellow.  Rest when you feel you need to.  To help with pain, try: ? Sipping warm liquids, such as broth, herbal tea, or warm water. ? Eating or drinking cold or frozen liquids, such as frozen ice pops. ? Gargling with a salt-water mixture 3-4 times a day or as needed. To make a salt-water mixture, add -1 tsp of salt in 1 cup of warm water. Mix it until you cannot see the salt anymore. ? Sucking on hard candy or throat lozenges. ? Putting a cool-mist humidifier in your bedroom at night. ? Sitting in the bathroom with the door closed for 5-10 minutes while you run hot water in the shower.  Do not use any tobacco products, such as cigarettes, chewing tobacco, and e-cigarettes. If you need help quitting, ask your doctor. Contact a doctor if:  You have a fever for more than 2-3 days.  You keep having symptoms for more than 2-3 days.  Your throat does not get better in 7 days.  You have a fever and your symptoms suddenly get worse. Get help right away if:  You have trouble breathing.  You cannot swallow fluids, soft foods, or your saliva.  You have swelling in your throat or neck that gets worse.  You keep feeling like you are going to throw up (vomit).  You keep throwing up. This information is not intended to replace advice given to you by your  health care provider. Make sure you discuss any questions you have with your health care provider. Document Released: 06/28/2008 Document Revised: 05/15/2016 Document Reviewed: 07/10/2015 Elsevier Interactive Patient Education  [employer].

## 2018-08-13 NOTE — Progress Notes (Signed)
Patient: Brooke Mcdowell Female    DOB: 03/22/1951   67 y.o.   MRN: [id number] Visit Date: 08/13/2018  Today's Provider: Trinna Post, PA-C   Chief Complaint  Patient presents with  . Sore Throat   Subjective:    HPI  Sore Throat Patient presents today for sorethroat since about 4 days now and states she had a fever all day on 08/12/2018. Sore throat has been worsening, has difficulty swallowing to the point where she cannot swallow her own saliva. Feels like she has swollen glands in her neck. Denies vomiting. Denies ear pain.     Allergies  Allergen Reactions  . Gabapentin Other (See Comments)    Caused migraines  . Tetracyclines & Related Itching and Nausea And Vomiting  . Sulfa Antibiotics Rash     Current Outpatient Medications:  .  ALPRAZolam (XANAX) 0.25 MG tablet, Take by mouth., Disp: , Rfl:  .  Biotin 10 MG TABS, Take by mouth daily. , Disp: , Rfl:  .  estradiol (ESTRACE) 1 MG tablet, Take one pill daily., Disp: 90 tablet, Rfl: 0 .  FLUAD 0.5 ML SUSY, ADM 0.5ML IM UTD, Disp: , Rfl: 0 .  fluticasone (FLONASE) 50 MCG/ACT nasal spray, Place 2 sprays into both nostrils daily., Disp: 16 g, Rfl: 6 .  FLUZONE HIGH-DOSE 0.5 ML injection, ADM 0.5ML IM UTD, Disp: , Rfl: 0 .  guaiFENesin (MUCINEX PO), Take by mouth daily. As needed, Disp: , Rfl:  .  HYDROcodone-homatropine (HYCODAN) 5-1.5 MG/5ML syrup, Take 5 mLs by mouth at bedtime as needed for cough., Disp: 120 mL, Rfl: 0 .  Magnesium 250 MG TABS, Take 500 mg by mouth daily., Disp: , Rfl:  .  magnesium 30 MG tablet, Take 30 mg by mouth 2 (two) times daily., Disp: , Rfl:  .  omeprazole (PRILOSEC) 40 MG capsule, Take 1 capsule (40 mg total) by mouth daily., Disp: 90 capsule, Rfl: 3 .  rizatriptan (MAXALT) 10 MG tablet, Take 1 tablet for migraine. May repeat in 2 hours if needed. Maximum 30 mg daily. (Patient taking differently: Take 5 mg by mouth as needed. Take 1 tablet for migraine. May repeat in 2 hours if  needed. Maximum 30 mg daily.), Disp: 10 tablet, Rfl: 3 .  traMADol (ULTRAM) 50 MG tablet, TK 1/2 TO 1 T PO BID PRN, Disp: , Rfl: 2 .  zolpidem (AMBIEN) 10 MG tablet, TAKE 1 TABLET(10 MG) BY MOUTH AT BEDTIME AS NEEDED FOR SLEEP, Disp: 30 tablet, Rfl: 0 .  amoxicillin (AMOXIL) 875 MG tablet, Take 1 tablet (875 mg total) by mouth 2 (two) times daily for 10 days., Disp: 20 tablet, Rfl: 0 .  chlorpheniramine-HYDROcodone (TUSSIONEX PENNKINETIC ER) 10-8 MG/5ML SUER, Take 5 mLs by mouth at bedtime as needed for cough., Disp: 140 mL, Rfl: 0 .  medroxyPROGESTERone (PROVERA) 2.5 MG tablet, Take 1 tablet (2.5 mg total) by mouth daily., Disp: 30 tablet, Rfl: 2 .  predniSONE (DELTASONE) 20 MG tablet, Take 1 tablet (20 mg total) by mouth daily with breakfast for 3 days., Disp: 3 tablet, Rfl: 0 .  sertraline (ZOLOFT) 50 MG tablet, Take 1 tablet (50 mg total) by mouth daily., Disp: 90 tablet, Rfl: 1  Review of Systems  Constitutional: Positive for fever.  HENT: Positive for sore throat.   Respiratory: Negative.   Genitourinary: Negative.   Neurological: Negative.     Social History   Tobacco Use  . Smoking status: Never Smoker  .  Smokeless tobacco: Never Used  Substance Use Topics  . Alcohol use: Yes    Comment: rarely - 1/2 drinks @ a time   Objective:   BP 118/78 (BP Location: Left Arm, Patient Position: Sitting, Cuff Size: Normal)   Pulse 86   Temp 99.9 F (37.7 C) (Oral)   Wt 113 lb 12.8 oz (51.6 kg)   SpO2 98%   BMI 21.50 kg/m  Vitals:   08/13/18 1330  BP: 118/78  Pulse: 86  Temp: 99.9 F (37.7 C)  TempSrc: Oral  SpO2: 98%  Weight: 113 lb 12.8 oz (51.6 kg)     Physical Exam  Constitutional: She is oriented to person, place, and time. She appears well-developed and well-nourished. She appears ill.  HENT:  Right Ear: Tympanic membrane and ear canal normal.  Left Ear: Tympanic membrane and ear canal normal.  Mouth/Throat: Uvula is midline. No oral lesions. No uvula swelling.  Posterior oropharyngeal edema and posterior oropharyngeal erythema present. No oropharyngeal exudate or tonsillar abscesses.  Significant erythema and edema of oropharynx.   Neck: Normal range of motion.  Cardiovascular: Normal rate, regular rhythm and normal heart sounds.  Pulmonary/Chest: Effort normal and breath sounds normal.  Lymphadenopathy:    She has cervical adenopathy.  Neurological: She is alert and oriented to person, place, and time.  Psychiatric: She has a normal mood and affect. Her behavior is normal.        Assessment & Plan:     1. Sore throat  Significant erythema and edema going on five days with fever. Rapid strep negative but throat is significantly erythematous. Will treat with antibiotics and prednisone.   - amoxicillin (AMOXIL) 875 MG tablet; Take 1 tablet (875 mg total) by mouth 2 (two) times daily for 10 days.  Dispense: 20 tablet; Refill: 0 - predniSONE (DELTASONE) 20 MG tablet; Take 1 tablet (20 mg total) by mouth daily with breakfast for 3 days.  Dispense: 3 tablet; Refill: 0  2. Cough  Counseled on sedation precautions.   - chlorpheniramine-HYDROcodone (TUSSIONEX PENNKINETIC ER) 10-8 MG/5ML SUER; Take 5 mLs by mouth at bedtime as needed for cough.  Dispense: 140 mL; Refill: 0  Return if symptoms worsen or fail to improve.  The entirety of the information documented in the History of Present Illness, Review of Systems and Physical Exam were personally obtained by me. Portions of this information were initially documented by Hurman Horn, CMA and reviewed by me for thoroughness and accuracy.        Trinna Post, PA-C  [facility]

## 2018-08-15 DIAGNOSIS — H2513 Age-related nuclear cataract, bilateral: Secondary | ICD-10-CM | POA: Diagnosis not present

## 2018-08-16 DIAGNOSIS — M5136 Other intervertebral disc degeneration, lumbar region: Secondary | ICD-10-CM | POA: Diagnosis not present

## 2018-08-16 DIAGNOSIS — M5416 Radiculopathy, lumbar region: Secondary | ICD-10-CM | POA: Diagnosis not present

## 2018-08-23 ENCOUNTER — Other Ambulatory Visit: Payer: Self-pay | Admitting: Physician Assistant

## 2018-08-23 DIAGNOSIS — F419 Anxiety disorder, unspecified: Secondary | ICD-10-CM

## 2018-08-23 DIAGNOSIS — Z79890 Hormone replacement therapy (postmenopausal): Secondary | ICD-10-CM

## 2018-08-23 DIAGNOSIS — G4700 Insomnia, unspecified: Secondary | ICD-10-CM

## 2018-09-07 ENCOUNTER — Other Ambulatory Visit: Payer: Self-pay | Admitting: Physician Assistant

## 2018-09-07 DIAGNOSIS — Z1231 Encounter for screening mammogram for malignant neoplasm of breast: Secondary | ICD-10-CM

## 2018-09-13 ENCOUNTER — Encounter: Payer: Self-pay | Admitting: Physician Assistant

## 2018-09-13 ENCOUNTER — Ambulatory Visit (INDEPENDENT_AMBULATORY_CARE_PROVIDER_SITE_OTHER): Payer: PPO | Admitting: Physician Assistant

## 2018-09-13 VITALS — BP 133/82 | HR 58 | Temp 98.7°F | Wt 114.4 lb

## 2018-09-13 DIAGNOSIS — Z131 Encounter for screening for diabetes mellitus: Secondary | ICD-10-CM

## 2018-09-13 DIAGNOSIS — E7800 Pure hypercholesterolemia, unspecified: Secondary | ICD-10-CM | POA: Diagnosis not present

## 2018-09-13 DIAGNOSIS — G4700 Insomnia, unspecified: Secondary | ICD-10-CM

## 2018-09-13 DIAGNOSIS — Z79890 Hormone replacement therapy (postmenopausal): Secondary | ICD-10-CM | POA: Diagnosis not present

## 2018-09-13 DIAGNOSIS — K219 Gastro-esophageal reflux disease without esophagitis: Secondary | ICD-10-CM | POA: Diagnosis not present

## 2018-09-13 DIAGNOSIS — Z130 Encounter for screening for diseases of the blood and blood-forming organs and certain disorders involving the immune mechanism: Secondary | ICD-10-CM

## 2018-09-13 DIAGNOSIS — Z1329 Encounter for screening for other suspected endocrine disorder: Secondary | ICD-10-CM | POA: Diagnosis not present

## 2018-09-13 DIAGNOSIS — G43009 Migraine without aura, not intractable, without status migrainosus: Secondary | ICD-10-CM | POA: Diagnosis not present

## 2018-09-13 DIAGNOSIS — Z13220 Encounter for screening for lipoid disorders: Secondary | ICD-10-CM

## 2018-09-13 DIAGNOSIS — Z0000 Encounter for general adult medical examination without abnormal findings: Secondary | ICD-10-CM

## 2018-09-13 NOTE — Patient Instructions (Addendum)

## 2018-09-13 NOTE — Progress Notes (Signed)
Patient: Brooke Mcdowell, Female    DOB: 02-25-1951, 67 y.o.   MRN: [id number] Visit Date: 09/13/2018  Today's Provider: Trinna Post, PA-C   Chief Complaint  Patient presents with  . Annual Exam   Subjective:     [name] is a 67 y.o. female. She feels well. She reports exercising includes walking.. She reports she is sleeping fairly well. [place] of [place]   2012: colonoscopy normal repeat 10 years Mammo: 08/29/2017 normal. 10/08/2018 scheduled.  DEXA: 10/2017 osteopenic  PAP: not indicated  Cataract surgery: Dr. Marchia Meiers [facility] scheduled in march for her right eye  Hormone Replacement Therapy: post menopausal with intolerable vasomotor symptoms including hot flashes and night sweats. She retains all of her reproductive organs and currently is prescribe estradiol 1 mg daily and provera 2.5 mg daily. She reports she only takes half of these medications. She has been instructed on the risks of long term hormone replacement therapy and is accepting of these. She desires to continue with hormone replacement therapy.  Insomnia: Ambien 10 mg nightly. She has been on this many years at this dose. Helps with her insomnia. No hangover effect.  Depression and anxiety: taking 50 mg zoloft daily. Does well on this, reports her mood is well.   Migraines: Happen very infrequently, maybe once or twice a month. She had a prescription for maxalt 10 mg #10 pills with 3 refills in 09/2017 and has not requested additional refills since this script.  Lumbar radiculitis: Continues to see Dr. Sharlet Salina, has received steroid injection on 08/16/2018 and continues with additional pain management with Tramadol via Dr. Sharlet Salina.   Acid Reflux/Abdominal Pain: Has stopped NSAIDs, continues with prilosec. Continues to drink a single can of Dr. Malachi Bonds daily. Has seen Dr. Vicente Males in [facility] for this. Reports her abdominal pain still  continues and she will likely follow up with Dr. Vicente Males for how to move forward.   BP Readings from Last 3 Encounters:  09/13/18 133/82  08/13/18 118/78  07/03/18 101/65    -----------------------------------------------------------   Review of Systems  Constitutional: Positive for fatigue.  HENT: Positive for congestion and rhinorrhea.   Eyes: Positive for photophobia and visual disturbance.  Respiratory: Positive for cough.   Cardiovascular: Negative.   Gastrointestinal: Positive for constipation.  Endocrine: Negative.   Genitourinary: Negative.   Musculoskeletal: Positive for back pain.  Skin: Negative.   Allergic/Immunologic: Positive for environmental allergies.  Neurological: Negative.   Hematological: Negative.   Psychiatric/Behavioral: Negative.     Social History   Socioeconomic History  . Marital status: Divorced    Spouse name: Not on file  . Number of children: 2  . Years of education: Not on file  . Highest education level: Some college, no degree  Occupational History  . Occupation: retired  Scientific laboratory technician  . Financial resource strain: Not hard at all  . Food insecurity:    Worry: Never true    Inability: Never true  . Transportation needs:    Medical: No    Non-medical: No  Tobacco Use  . Smoking status: Never Smoker  . Smokeless tobacco: Never Used  Substance and Sexual Activity  . Alcohol use: Yes    Comment: rarely - 1/2 drinks @ a time  . Drug use: No  . Sexual activity: Not on file  Lifestyle  . Physical activity:    Days per week: Not  on file    Minutes per session: Not on file  . Stress: Not at all  Relationships  . Social connections:    Talks on phone: Not on file    Gets together: Not on file    Attends religious service: Not on file    Active member of club or organization: Not on file    Attends meetings of clubs or organizations: Not on file    Relationship status: Not on file  . Intimate partner violence:    Fear of current  or ex partner: Not on file    Emotionally abused: Not on file    Physically abused: Not on file    Forced sexual activity: Not on file  Other Topics Concern  . Not on file  Social History Narrative  . Not on file    Past Medical History:  Diagnosis Date  . Arthropathy of hand   . Dysthymic disorder   . Esophageal reflux   . Insomnia   . Lumbar herniated disc   . Migraine   . Panic disorder   . Pure hypercholesterolemia   . Vitamin D deficiency      Patient Active Problem List   Diagnosis Date Noted  . Anxiety 08/05/2016  . Restless legs 08/05/2016  . Insomnia 08/04/2016  . Esophageal reflux   . Vitamin D deficiency   . Pure hypercholesterolemia   . Migraine   . Dysthymic disorder   . Arthropathy of hand   . Panic disorder     Past Surgical History:  Procedure Laterality Date  . clot removal from right sinus after surgery to remove tumor in 1995    . LITHOTRIPSY  [phone]   renal stones  . TUBAL LIGATION    . TUMOR REMOVAL     behind right ete in sinuses- 1995 Dr. Pryor Ochoa    Her family history includes Brain cancer in her father and mother; Cancer in her father and mother.      Current Outpatient Medications:  .  ALPRAZolam (XANAX) 0.25 MG tablet, Take by mouth., Disp: , Rfl:  .  Biotin 10 MG TABS, Take by mouth daily. , Disp: , Rfl:  .  chlorpheniramine-HYDROcodone (TUSSIONEX PENNKINETIC ER) 10-8 MG/5ML SUER, Take 5 mLs by mouth at bedtime as needed for cough., Disp: 140 mL, Rfl: 0 .  estradiol (ESTRACE) 1 MG tablet, TAKE 1 TABLET BY MOUTH DAILY, Disp: 90 tablet, Rfl: 0 .  FLUAD 0.5 ML SUSY, ADM 0.5ML IM UTD, Disp: , Rfl: 0 .  fluticasone (FLONASE) 50 MCG/ACT nasal spray, Place 2 sprays into both nostrils daily., Disp: 16 g, Rfl: 6 .  FLUZONE HIGH-DOSE 0.5 ML injection, ADM 0.5ML IM UTD, Disp: , Rfl: 0 .  guaiFENesin (MUCINEX PO), Take by mouth daily. As needed, Disp: , Rfl:  .  HYDROcodone-homatropine (HYCODAN) 5-1.5 MG/5ML syrup, Take 5 mLs by mouth  at bedtime as needed for cough., Disp: 120 mL, Rfl: 0 .  Magnesium 250 MG TABS, Take 500 mg by mouth daily., Disp: , Rfl:  .  magnesium 30 MG tablet, Take 30 mg by mouth 2 (two) times daily., Disp: , Rfl:  .  medroxyPROGESTERone (PROVERA) 2.5 MG tablet, TAKE 1 TABLET(2.5 MG) BY MOUTH DAILY, Disp: 90 tablet, Rfl: 0 .  omeprazole (PRILOSEC) 40 MG capsule, Take 1 capsule (40 mg total) by mouth daily., Disp: 90 capsule, Rfl: 3 .  rizatriptan (MAXALT) 10 MG tablet, Take 1 tablet for migraine. May repeat in 2 hours if needed. Maximum  30 mg daily. (Patient taking differently: Take 5 mg by mouth as needed. Take 1 tablet for migraine. May repeat in 2 hours if needed. Maximum 30 mg daily.), Disp: 10 tablet, Rfl: 3 .  sertraline (ZOLOFT) 50 MG tablet, TAKE 1 TABLET(50 MG) BY MOUTH DAILY, Disp: 90 tablet, Rfl: 0 .  traMADol (ULTRAM) 50 MG tablet, TK 1/2 TO 1 T PO BID PRN, Disp: , Rfl: 2 .  zolpidem (AMBIEN) 10 MG tablet, TAKE 1 TABLET(10 MG) BY MOUTH AT BEDTIME AS NEEDED FOR SLEEP, Disp: 30 tablet, Rfl: 0  Patient Care Team: Paulene Floor as PCP - General (Physician Assistant) Ronnell Freshwater, MD as Referring Physician (Ophthalmology) Sharlet Salina, MD as Referring Physician (Physical Medicine and Rehabilitation) Clyde Canterbury, MD as Referring Physician (Otolaryngology)     Objective:   Vitals: BP 133/82 (BP Location: Left Arm, Patient Position: Sitting, Cuff Size: Normal)   Pulse (!) 58   Temp 98.7 F (37.1 C) (Oral)   Wt 114 lb 6.4 oz (51.9 kg)   SpO2 99%   BMI 21.62 kg/m   Physical Exam Constitutional:      Appearance: Normal appearance.  HENT:     Right Ear: Tympanic membrane and ear canal normal.     Left Ear: Tympanic membrane and ear canal normal.     Mouth/Throat:     Mouth: Mucous membranes are moist.     Pharynx: Oropharynx is clear.  Cardiovascular:     Rate and Rhythm: Normal rate and regular rhythm.     Heart sounds: Normal heart sounds.  Pulmonary:      Effort: Pulmonary effort is normal.     Breath sounds: Normal breath sounds.  Abdominal:     General: Bowel sounds are normal.     Palpations: Abdomen is soft.  Skin:    General: Skin is warm and dry.  Neurological:     General: No focal deficit present.     Mental Status: She is alert and oriented to person, place, and time.  Psychiatric:        Mood and Affect: Mood normal.        Behavior: Behavior normal.     Activities of Daily Living In your present state of health, do you have any difficulty performing the following activities: 09/13/2018 06/15/2018  Hearing? N N  Vision? N Y  Comment - Due to catacts in both eyes.   Difficulty concentrating or making decisions? N N  Walking or climbing stairs? N N  Dressing or bathing? N N  Doing errands, shopping? N N  Preparing Food and eating ? - N  Using the Toilet? - N  In the past six months, have you accidently leaked urine? - N  Do you have problems with loss of bowel control? - N  Managing your Medications? - N  Managing your Finances? - N  Housekeeping or managing your Housekeeping? - N  Some recent data might be hidden    Fall Risk Assessment Fall Risk  09/13/2018 06/15/2018 06/13/2017  Falls in the past year? 0 No No  Number falls in past yr: 0 - -  Injury with Fall? 0 - -     Depression Screen PHQ 2/9 Scores 09/13/2018 06/15/2018 09/22/2017 06/13/2017  PHQ - 2 Score 0 0 0 0  PHQ- 9 Score 2 - 4 2    6CIT Screen 06/13/2017  What Year? 0 points  What month? 0 points  What time? 0 points  Count back from  20 0 points  Months in reverse 0 points  Repeat phrase 4 points  Total Score 4      Assessment & Plan:    Annual Physical Reviewed patient's Family Medical History Reviewed and updated list of patient's medical providers Assessment of cognitive impairment was done Assessed patient's functional ability Established a written schedule for health screening [place] Completed and  Reviewed  Exercise Activities and Dietary recommendations Goals    . Increase water intake     Recommend increasing water intake to 4-6 glasses a day.        Immunization History  Administered Date(s) Administered  . Influenza, High Dose Seasonal PF 07/20/2017  . Influenza-Unspecified 06/29/2018  . Pneumococcal Conjugate-13 06/13/2017  . Pneumococcal Polysaccharide-23 07/03/2018  . Tdap 03/17/2017  . Zoster 04/26/2013    Health Maintenance  Topic Date Due  . MAMMOGRAM  08/23/2019  . COLONOSCOPY  12/19/2020  . TETANUS/TDAP  03/18/2027  . INFLUENZA VACCINE  Completed  . DEXA SCAN  Completed  . Hepatitis C Screening  Completed  . PNA vac Low Risk Adult  Completed     Discussed health benefits of physical activity, and encouraged her to engage in regular exercise appropriate for her age and condition.   1. Annual physical exam   2. Diabetes mellitus screening  - Comprehensive Metabolic Panel (CMET)  3. Thyroid disorder screening  - TSH  4. Screening for deficiency anemia  - CBC With Differential  5. Screening cholesterol level   6. Pure hypercholesterolemia  - Lipid Profile  7. Insomnia, unspecified type  Continue ambien.  8. Gastroesophageal reflux disease, esophagitis presence not specified   9. Hormone replacement therapy  Continue. Have explained risks of treatment.   10. Migraine without aura and without status migrainosus, not intractable  Controlled with PRN triptan.  Return in about 1 year (around 09/14/2019) for CPE .    ------------------------------------------------------------------------------------------------------------    Trinna Post, PA-C  [facility]

## 2018-09-14 ENCOUNTER — Telehealth: Payer: Self-pay

## 2018-09-14 LAB — LIPID PANEL
Chol/HDL Ratio: 4.4 ratio (ref 0.0–4.4)
Cholesterol, Total: 240 mg/dL — ABNORMAL HIGH (ref 100–199)
HDL: 55 mg/dL (ref >39)
LDL Calculated: 156 mg/dL — ABNORMAL HIGH (ref 0–99)
Triglycerides: 145 mg/dL (ref 0–149)
VLDL Cholesterol Cal: 29 mg/dL (ref 5–40)

## 2018-09-14 LAB — COMPREHENSIVE METABOLIC PANEL
ALT: 14 IU/L (ref 0–32)
AST: 18 IU/L (ref 0–40)
Albumin/Globulin Ratio: 1.9 (ref 1.2–2.2)
Albumin: 4.4 g/dL (ref 3.6–4.8)
Alkaline Phosphatase: 61 IU/L (ref 39–117)
BUN/Creatinine Ratio: 14 (ref 12–28)
BUN: 10 mg/dL (ref 8–27)
Bilirubin Total: 0.5 mg/dL (ref 0.0–1.2)
CO2: 21 mmol/L (ref 20–29)
Calcium: 9.5 mg/dL (ref 8.7–10.3)
Chloride: 106 mmol/L (ref 96–106)
Creatinine, Ser: 0.74 mg/dL (ref 0.57–1.00)
GFR calc Af Amer: 96 mL/min/{1.73_m2} (ref >59)
GFR calc non Af Amer: 84 mL/min/{1.73_m2} (ref >59)
Globulin, Total: 2.3 g/dL (ref 1.5–4.5)
Glucose: 90 mg/dL (ref 65–99)
Potassium: 4.3 mmol/L (ref 3.5–5.2)
Sodium: 144 mmol/L (ref 134–144)
Total Protein: 6.7 g/dL (ref 6.0–8.5)

## 2018-09-14 LAB — CBC WITH DIFFERENTIAL
Basophils Absolute: 0 10*3/uL (ref 0.0–0.2)
Basos: 1 %
EOS (ABSOLUTE): 0.1 10*3/uL (ref 0.0–0.4)
Eos: 2 %
Hematocrit: 37.7 % (ref 34.0–46.6)
Hemoglobin: 12.8 g/dL (ref 11.1–15.9)
Immature Grans (Abs): 0 10*3/uL (ref 0.0–0.1)
Immature Granulocytes: 0 %
Lymphocytes Absolute: 1.3 10*3/uL (ref 0.7–3.1)
Lymphs: 26 %
MCH: 30.8 pg (ref 26.6–33.0)
MCHC: 34 g/dL (ref 31.5–35.7)
MCV: 91 fL (ref 79–97)
Monocytes Absolute: 0.4 10*3/uL (ref 0.1–0.9)
Monocytes: 9 %
Neutrophils Absolute: 3 10*3/uL (ref 1.4–7.0)
Neutrophils: 62 %
RBC: 4.16 x10E6/uL (ref 3.77–5.28)
RDW: 12 % — ABNORMAL LOW (ref 12.3–15.4)
WBC: 4.9 10*3/uL (ref 3.4–10.8)

## 2018-09-14 LAB — TSH: TSH: 2.39 u[IU]/mL (ref 0.450–4.500)

## 2018-09-14 NOTE — Telephone Encounter (Signed)
Patient advised as directed below.  Thanks,  - 

## 2018-09-14 NOTE — Telephone Encounter (Signed)
-----   Message from Trinna Post, [place] sent at 09/14/2018  1:51 PM EST ----- CMET normal. Cholesterol did increase slightly but does not need treatment. CBC and TSH normal.

## 2018-09-27 ENCOUNTER — Other Ambulatory Visit: Payer: Self-pay | Admitting: Physician Assistant

## 2018-09-27 DIAGNOSIS — G4700 Insomnia, unspecified: Secondary | ICD-10-CM

## 2018-10-08 ENCOUNTER — Ambulatory Visit
Admission: RE | Admit: 2018-10-08 | Discharge: 2018-10-08 | Disposition: A | Discharge status: Home / Self Care | Payer: PPO | Source: Ambulatory Visit | Attending: Physician Assistant | Admitting: Physician Assistant

## 2018-10-08 DIAGNOSIS — Z1231 Encounter for screening mammogram for malignant neoplasm of breast: Secondary | ICD-10-CM | POA: Diagnosis not present

## 2018-10-14 IMAGING — CR DG CHEST 2V
1 series · 2 of 2 positions shown · non-contrast
Comparison: None.

CLINICAL DATA: Cough for 4 weeks.  Shortness of Breath

EXAM:
CHEST  2 VIEW

[Series 1: dg chest 2 view · 0.14mm/px · 2 of 2 slices shown]
[im 1/2]
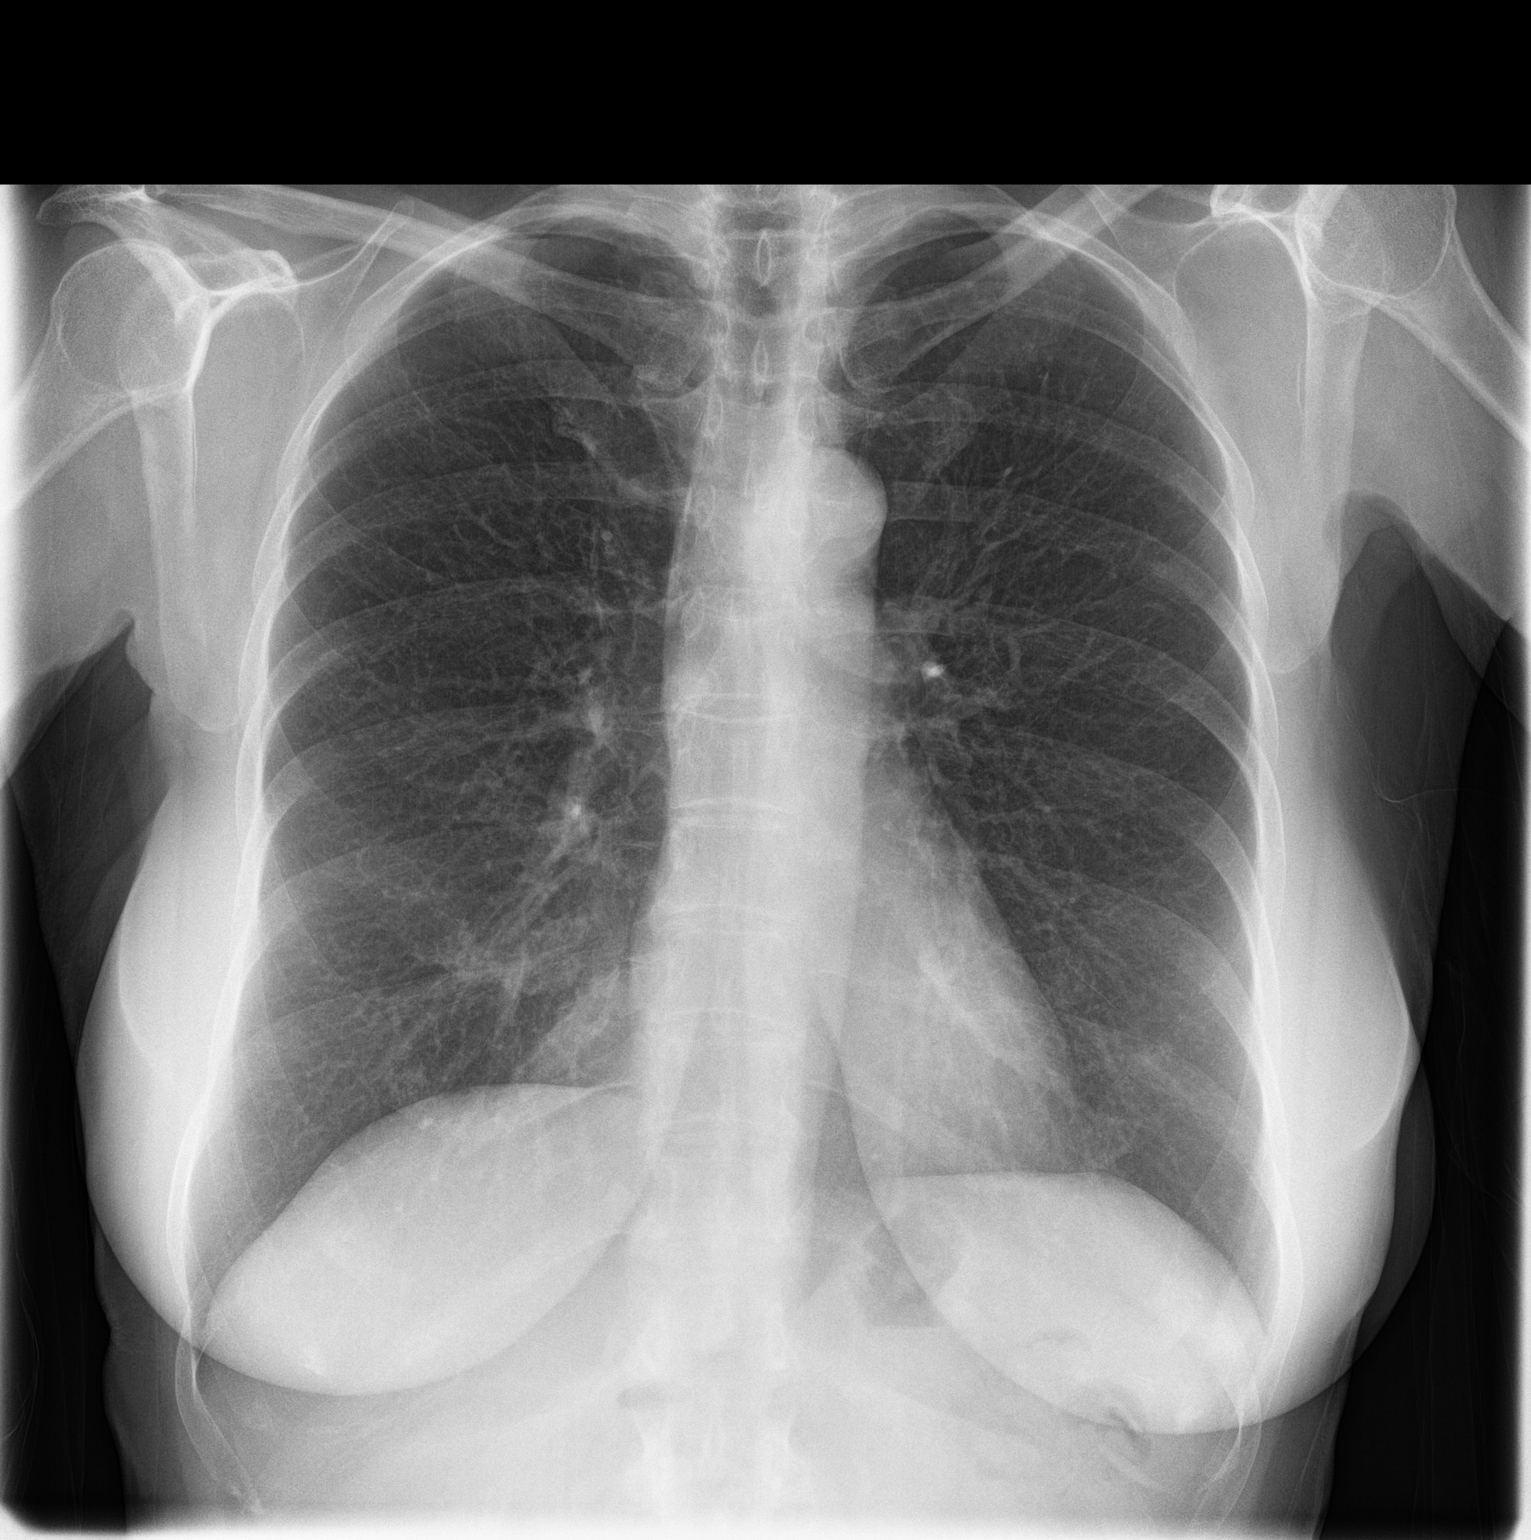
[im 2/2]
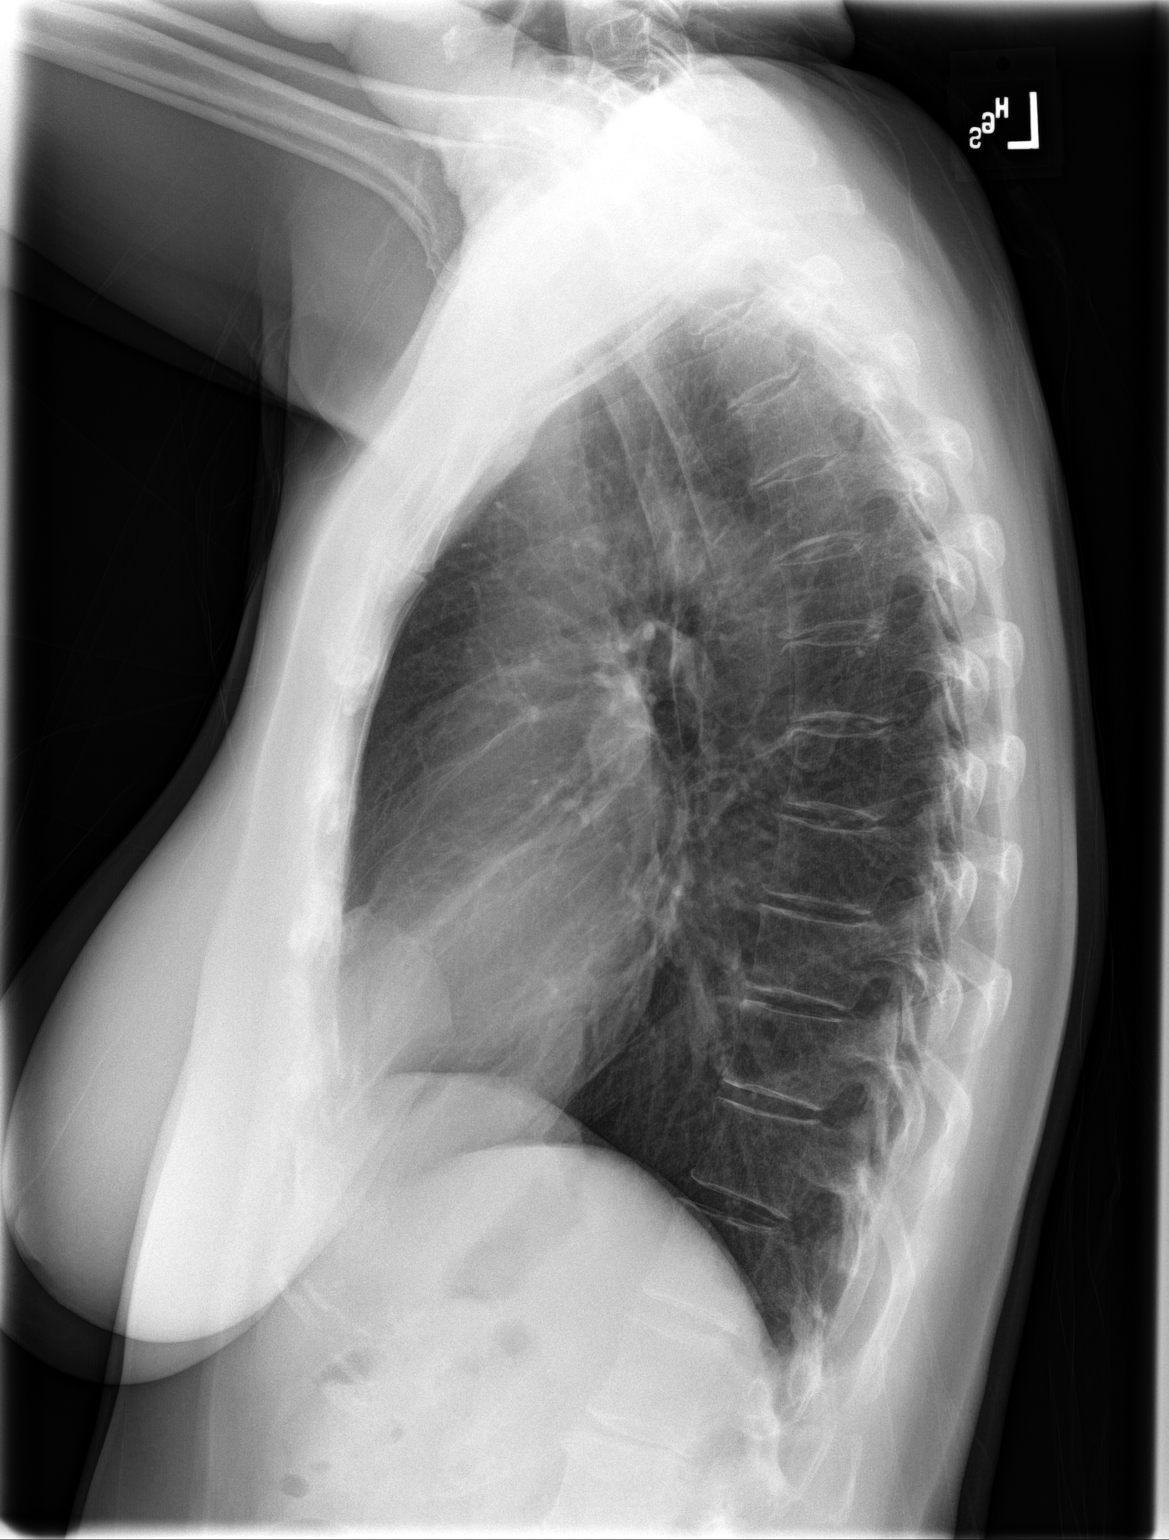

[2 of 2 positions shown; findings below may reference images not displayed]

FINDINGS: There is no edema or consolidation. Heart size and pulmonary
vascularity are normal. No adenopathy. No bone lesions.
IMPRESSION: No edema or consolidation.

## 2018-10-19 DIAGNOSIS — L820 Inflamed seborrheic keratosis: Secondary | ICD-10-CM | POA: Diagnosis not present

## 2018-10-19 DIAGNOSIS — R208 Other disturbances of skin sensation: Secondary | ICD-10-CM | POA: Diagnosis not present

## 2018-10-19 DIAGNOSIS — D2261 Melanocytic nevi of right upper limb, including shoulder: Secondary | ICD-10-CM | POA: Diagnosis not present

## 2018-10-19 DIAGNOSIS — L570 Actinic keratosis: Secondary | ICD-10-CM | POA: Diagnosis not present

## 2018-10-19 DIAGNOSIS — X32XXXA Exposure to sunlight, initial encounter: Secondary | ICD-10-CM | POA: Diagnosis not present

## 2018-10-19 DIAGNOSIS — Z85828 Personal history of other malignant neoplasm of skin: Secondary | ICD-10-CM | POA: Diagnosis not present

## 2018-10-19 DIAGNOSIS — D2262 Melanocytic nevi of left upper limb, including shoulder: Secondary | ICD-10-CM | POA: Diagnosis not present

## 2018-10-19 DIAGNOSIS — D2272 Melanocytic nevi of left lower limb, including hip: Secondary | ICD-10-CM | POA: Diagnosis not present

## 2018-10-19 DIAGNOSIS — L538 Other specified erythematous conditions: Secondary | ICD-10-CM | POA: Diagnosis not present

## 2018-11-19 ENCOUNTER — Other Ambulatory Visit: Payer: Self-pay | Admitting: Physician Assistant

## 2018-11-19 DIAGNOSIS — Z79890 Hormone replacement therapy (postmenopausal): Secondary | ICD-10-CM

## 2018-11-19 DIAGNOSIS — H2511 Age-related nuclear cataract, right eye: Secondary | ICD-10-CM | POA: Diagnosis not present

## 2018-11-19 DIAGNOSIS — F419 Anxiety disorder, unspecified: Secondary | ICD-10-CM

## 2018-12-04 ENCOUNTER — Other Ambulatory Visit: Payer: Self-pay | Admitting: Family Medicine

## 2018-12-04 DIAGNOSIS — G4700 Insomnia, unspecified: Secondary | ICD-10-CM

## 2018-12-05 ENCOUNTER — Encounter: Payer: Self-pay | Admitting: *Deleted

## 2018-12-05 NOTE — Telephone Encounter (Signed)
Pt is asking for a refill on her ambien 10 mg  [employer]

## 2018-12-06 ENCOUNTER — Other Ambulatory Visit: Payer: Self-pay

## 2018-12-06 ENCOUNTER — Ambulatory Visit: Payer: PPO | Admitting: Anesthesiology

## 2018-12-06 ENCOUNTER — Encounter
Admission: RE | Disposition: A | Discharge status: Home / Self Care | Payer: Self-pay | Source: Home / Self Care | Attending: Ophthalmology

## 2018-12-06 ENCOUNTER — Ambulatory Visit
Admission: RE | Admit: 2018-12-06 | Discharge: 2018-12-06 | Disposition: A | Discharge status: Home / Self Care | Payer: PPO | Attending: Ophthalmology | Admitting: Ophthalmology

## 2018-12-06 ENCOUNTER — Encounter: Payer: Self-pay | Admitting: *Deleted

## 2018-12-06 DIAGNOSIS — Z888 Allergy status to other drugs, medicaments and biological substances status: Secondary | ICD-10-CM | POA: Diagnosis not present

## 2018-12-06 DIAGNOSIS — Z881 Allergy status to other antibiotic agents status: Secondary | ICD-10-CM | POA: Insufficient documentation

## 2018-12-06 DIAGNOSIS — F329 Major depressive disorder, single episode, unspecified: Secondary | ICD-10-CM | POA: Insufficient documentation

## 2018-12-06 DIAGNOSIS — H2511 Age-related nuclear cataract, right eye: Secondary | ICD-10-CM | POA: Diagnosis not present

## 2018-12-06 DIAGNOSIS — Z79890 Hormone replacement therapy (postmenopausal): Secondary | ICD-10-CM | POA: Diagnosis not present

## 2018-12-06 DIAGNOSIS — F419 Anxiety disorder, unspecified: Secondary | ICD-10-CM | POA: Diagnosis not present

## 2018-12-06 DIAGNOSIS — Z882 Allergy status to sulfonamides status: Secondary | ICD-10-CM | POA: Insufficient documentation

## 2018-12-06 DIAGNOSIS — Z79899 Other long term (current) drug therapy: Secondary | ICD-10-CM | POA: Insufficient documentation

## 2018-12-06 HISTORY — DX: Personal history of urinary calculi: Z87.442

## 2018-12-06 HISTORY — PX: CATARACT EXTRACTION W/PHACO: SHX586

## 2018-12-06 SURGERY — PHACOEMULSIFICATION, CATARACT, WITH IOL INSERTION
Anesthesia: Monitor Anesthesia Care | Site: Eye | Laterality: Right

## 2018-12-06 MED ORDER — MOXIFLOXACIN HCL 0.5 % OP SOLN: 1.0000 [drp] | OPHTHALMIC | Status: DC | PRN

## 2018-12-06 MED ORDER — SODIUM CHLORIDE 0.9 % IV SOLN
INTRAVENOUS | Status: DC
  Administered 2018-12-06 (×2): via INTRAVENOUS

## 2018-12-06 MED ORDER — LIDOCAINE HCL (PF) 4 % IJ SOLN
INTRAMUSCULAR | Status: AC
  Filled 2018-12-06: qty 5

## 2018-12-06 MED ORDER — EPINEPHRINE PF 1 MG/ML IJ SOLN
INTRAOCULAR | Status: DC | PRN
  Administered 2018-12-06: 10:00:00 via OPHTHALMIC

## 2018-12-06 MED ORDER — NA CHONDROIT SULF-NA HYALURON 40-17 MG/ML IO SOLN
INTRAOCULAR | Status: AC
  Filled 2018-12-06: qty 1

## 2018-12-06 MED ORDER — TRYPAN BLUE 0.06 % OP SOLN
OPHTHALMIC | Status: DC | PRN
  Administered 2018-12-06: 0.5 mL via INTRAOCULAR

## 2018-12-06 MED ORDER — ACETAMINOPHEN 500 MG PO TABS
ORAL_TABLET | ORAL | Status: AC
  Filled 2018-12-06: qty 2

## 2018-12-06 MED ORDER — MOXIFLOXACIN HCL 0.5 % OP SOLN
OPHTHALMIC | Status: DC | PRN
  Administered 2018-12-06: 0.2 mL via OPHTHALMIC

## 2018-12-06 MED ORDER — MIDAZOLAM HCL 2 MG/2ML IJ SOLN
INTRAMUSCULAR | Status: DC | PRN
  Administered 2018-12-06 (×2): 1 mg via INTRAVENOUS

## 2018-12-06 MED ORDER — NA HYALUR & NA CHOND-NA HYALUR 0.55-0.5 ML IO KIT
PACK | INTRAOCULAR | Status: DC | PRN
  Administered 2018-12-06: 1 via OPHTHALMIC

## 2018-12-06 MED ORDER — TRYPAN BLUE 0.06 % OP SOLN
OPHTHALMIC | Status: AC
  Filled 2018-12-06: qty 0.5

## 2018-12-06 MED ORDER — ARMC OPHTHALMIC DILATING DROPS
1.0000 "application " | OPHTHALMIC | Status: AC
  Administered 2018-12-06 (×3): 1 via OPHTHALMIC

## 2018-12-06 MED ORDER — POVIDONE-IODINE 5 % OP SOLN
OPHTHALMIC | Status: DC | PRN
  Administered 2018-12-06: 1 via OPHTHALMIC

## 2018-12-06 MED ORDER — EPINEPHRINE PF 1 MG/ML IJ SOLN
INTRAMUSCULAR | Status: AC
  Filled 2018-12-06: qty 2

## 2018-12-06 MED ORDER — NA CHONDROIT SULF-NA HYALURON 40-17 MG/ML IO SOLN
INTRAOCULAR | Status: DC | PRN
  Administered 2018-12-06: 1 mL via INTRAOCULAR

## 2018-12-06 MED ORDER — POVIDONE-IODINE 5 % OP SOLN
OPHTHALMIC | Status: AC
  Filled 2018-12-06: qty 30

## 2018-12-06 MED ORDER — LIDOCAINE HCL (PF) 4 % IJ SOLN
INTRAOCULAR | Status: DC | PRN
  Administered 2018-12-06: 4 mL via OPHTHALMIC

## 2018-12-06 MED ORDER — CARBACHOL 0.01 % IO SOLN
INTRAOCULAR | Status: DC | PRN
  Administered 2018-12-06: 0.5 mL via INTRAOCULAR

## 2018-12-06 MED ORDER — TETRACAINE HCL 0.5 % OP SOLN
OPHTHALMIC | Status: AC
  Administered 2018-12-06: 1 [drp] via OPHTHALMIC
  Filled 2018-12-06: qty 4

## 2018-12-06 MED ORDER — NA HYALUR & NA CHOND-NA HYALUR 0.55-0.5 ML IO KIT
PACK | INTRAOCULAR | Status: AC
  Filled 2018-12-06: qty 1.05

## 2018-12-06 MED ORDER — TETRACAINE HCL 0.5 % OP SOLN
1.0000 [drp] | OPHTHALMIC | Status: AC | PRN
  Administered 2018-12-06 (×3): 1 [drp] via OPHTHALMIC

## 2018-12-06 MED ORDER — MOXIFLOXACIN HCL 0.5 % OP SOLN
OPHTHALMIC | Status: AC
  Filled 2018-12-06: qty 3

## 2018-12-06 MED ORDER — ACETAMINOPHEN 500 MG PO TABS
1000.0000 mg | ORAL_TABLET | Freq: Once | ORAL | Status: AC
  Administered 2018-12-06: 1000 mg via ORAL

## 2018-12-06 MED ORDER — MIDAZOLAM HCL 2 MG/2ML IJ SOLN
INTRAMUSCULAR | Status: AC
  Filled 2018-12-06: qty 2

## 2018-12-06 SURGICAL SUPPLY — 18 items
DISSECTOR HYDRO NUCLEUS 50X22 (MISCELLANEOUS) ×8 IMPLANT
GLOVE BIOGEL M 6.5 STRL (GLOVE) ×2 IMPLANT
GOWN STRL REUS W/ TWL LRG LVL3 (GOWN DISPOSABLE) ×1 IMPLANT
GOWN STRL REUS W/ TWL XL LVL3 (GOWN DISPOSABLE) ×1 IMPLANT
GOWN STRL REUS W/TWL LRG LVL3 (GOWN DISPOSABLE) ×2
GOWN STRL REUS W/TWL XL LVL3 (GOWN DISPOSABLE) ×2
KNIFE 45D UP 2.3 (MISCELLANEOUS) ×2 IMPLANT
LABEL CATARACT MEDS ST (LABEL) ×2 IMPLANT
LENS IOL ACRSF IQ ULTRA 23.0 (Intraocular Lens) IMPLANT
LENS IOL ACRYSOF IQ 23.0 (Intraocular Lens) ×2 IMPLANT
PACK CATARACT (MISCELLANEOUS) ×2 IMPLANT
PACK CATARACT KING (MISCELLANEOUS) ×2 IMPLANT
PACK EYE AFTER SURG (MISCELLANEOUS) ×2 IMPLANT
SOL BSS BAG (MISCELLANEOUS) ×2
SOLUTION BSS BAG (MISCELLANEOUS) ×1 IMPLANT
SYR 5ML LL (SYRINGE) ×2 IMPLANT
WATER STERILE IRR 250ML POUR (IV SOLUTION) ×2 IMPLANT
WIPE NON LINTING 3.25X3.25 (MISCELLANEOUS) ×2 IMPLANT

## 2018-12-06 NOTE — Anesthesia Preprocedure Evaluation (Addendum)
Anesthesia Evaluation  Patient identified by MRN, date of birth, ID band Patient awake    Reviewed: Allergy & Precautions, H&P , NPO status , Patient's Chart, lab work & pertinent test results  History of Anesthesia Complications Negative for: history of anesthetic complications  Airway Mallampati: II  TM Distance: >3 FB     Dental  (+) Teeth Intact   Pulmonary neg pulmonary ROS, neg shortness of breath, neg COPD, neg recent URI,           Cardiovascular (-) angina(-) Past MI and (-) Cardiac Stents negative cardio ROS  (-) dysrhythmias      Neuro/Psych  Headaches, PSYCHIATRIC DISORDERS Anxiety Depression    GI/Hepatic Neg liver ROS, GERD  Controlled,  Endo/Other  negative endocrine ROS  Renal/GU      Musculoskeletal   Abdominal   Peds  Hematology negative hematology ROS (+)   Anesthesia Other Findings Past Medical History: No date: Arthropathy of hand No date: Dysthymic disorder No date: Esophageal reflux No date: History of kidney stones No date: Insomnia No date: Lumbar herniated disc No date: Migraine No date: Panic disorder No date: Pure hypercholesterolemia No date: Vitamin D deficiency  Past Surgical History: No date: clot removal from right sinus after surgery to remove tumor  in 1995 1993,1994,1995: LITHOTRIPSY     Comment:  renal stones No date: TUBAL LIGATION No date: TUMOR REMOVAL     Comment:  behind right ete in sinuses- 1995 Dr. Pryor Ochoa  BMI    Body Mass Index:  21.16 kg/m      Reproductive/Obstetrics negative OB ROS                            Anesthesia Physical Anesthesia Plan  ASA: II  Anesthesia Plan: MAC   Post-op Pain Management:    Induction:   PONV Risk Score and Plan:   Airway Management Planned: Natural Airway and Nasal Cannula  Additional Equipment:   Intra-op Plan:   Post-operative Plan:   Informed Consent: I have reviewed the  patients History and Physical, chart, labs and discussed the procedure including the risks, benefits and alternatives for the proposed anesthesia with the patient or authorized representative who has indicated his/her understanding and acceptance.       Plan Discussed with: Anesthesiologist and CRNA  Anesthesia Plan Comments:         Anesthesia Quick Evaluation

## 2018-12-06 NOTE — Op Note (Addendum)
   PREOPERATIVE DIAGNOSIS:  Nuclear sclerotic cataract of the RIGHT eye.   POSTOPERATIVE DIAGNOSIS:  Nuclear sclerotic cataract of the RIGHT eye.   OPERATIVE PROCEDURE: Cataract surgery OD   SURGEON:  Marchia Meiers, MD.   ANESTHESIA:  Anesthesiologist: Durenda Hurt, MD CRNA: Justus Memory, CRNA  1.      Managed anesthesia care. 2.     0.21ml of Shugarcaine was instilled following the paracentesis   COMPLICATIONS:  None.   TECHNIQUE:   Divide and conquer   DESCRIPTION OF PROCEDURE:  The patient was examined and consented in the preoperative holding area where the aforementioned topical anesthesia was applied to the RIGHT eye and then brought back to the Operating Room where the RIGHT eye was prepped and draped in the usual sterile ophthalmic fashion and a lid speculum was placed. A paracentesis was created with the side port blade, the anterior chamber was washed out with trypan blue to stain the anterior capsule, and the anterior chamber was filled with viscoelastic. A near clear corneal incision was performed with the steel keratome. A continuous curvilinear capsulorrhexis was performed with a cystotome followed by the capsulorrhexis forceps. Hydrodissection and hydrodelineation were carried out with BSS on a blunt cannula. The lens was removed in a divide and conquer  technique and the remaining cortical material was removed with the irrigation-aspiration handpiece. The capsular bag was inflated with viscoelastic and the lens was placed in the capsular bag without complication. The remaining viscoelastic was removed from the eye with the irrigation-aspiration handpiece. The wounds were hydrated. The anterior chamber was flushed and the eye was inflated to physiologic pressure. 0.35ml Vigamox was placed in the anterior chamber. The wounds were found to be water tight. The eye was dressed with Vigamox. The patient was given protective glasses to wear throughout the day and a shield  with which to sleep tonight. The patient was also given drops with which to begin a drop regimen today and will follow-up with me in one day. Implant Name Type Inv. Item Serial No. Manufacturer Lot No. LRB No. Used  LENS IOL ACRYSOF IQ 23.0 - [id number] Intraocular Lens LENS IOL ACRYSOF IQ 23.0 [id number] [employer]CON  Right 1    Procedure(s) with comments: CATARACT EXTRACTION PHACO AND INTRAOCULAR LENS PLACEMENT (IOC)-RIGHT (Right) - [place] 01:28.9 CDE 11.98 Fluid Pack Lot # [id number]  Electronically signed: Marchia Meiers 12/06/2018 10:16 AM

## 2018-12-06 NOTE — H&P (Signed)
   I have reviewed the patient's H&P and agree with its findings. There have been no interval changes.  Elizebath Wever MD Ophthalmology 

## 2018-12-06 NOTE — Discharge Instructions (Signed)
Eye Surgery Discharge Instructions    Expect mild scratchy sensation or mild soreness. DO NOT RUB YOUR EYE!  The day of surgery:  Minimal physical activity, but bed rest is not required  No reading, computer work, or close hand work  No bending, lifting, or straining.  May watch TV  For 24 hours:  No driving, legal decisions, or alcoholic beverages  Safety precautions  Eat anything you prefer: It is better to start with liquids, then soup then solid foods.  _____ Eye patch should be worn until postoperative exam tomorrow.  ____ Solar shield eyeglasses should be worn for comfort in the sunlight/patch while sleeping  Resume all regular medications including aspirin or Coumadin if these were discontinued prior to surgery. You may shower, bathe, shave, or wash your hair. Tylenol may be taken for mild discomfort.  Call your doctor if you experience significant pain, nausea, or vomiting, fever > 101 or other signs of infection. [phone] or [phone] Specific instructions:  Follow-up Information    Marchia Meiers, MD Follow up on 12/07/2018.   Specialty:  Ophthalmology Why:  9:15 Contact information: [address] [place] [address] [phone]

## 2018-12-06 NOTE — Anesthesia Post-op Follow-up Note (Signed)
Anesthesia QCDR form completed.        

## 2018-12-06 NOTE — Transfer of Care (Signed)
Immediate Anesthesia Transfer of Care Note  Patient: Brooke Mcdowell  Procedure(s) Performed: CATARACT EXTRACTION PHACO AND INTRAOCULAR LENS PLACEMENT (IOC)-RIGHT (Right Eye)  Patient Location: PACU  Anesthesia Type:MAC  Level of Consciousness: sedated  Airway & Oxygen Therapy: Patient Spontanous Breathing  Post-op Assessment: Report given to RN and Post -op Vital signs reviewed and stable  Post vital signs: Reviewed and stable  Last Vitals:  Vitals Value Taken Time  BP    Temp    Pulse    Resp    SpO2      Last Pain:  Vitals:   12/06/18 0817  TempSrc: Tympanic  PainSc: 0-No pain         Complications: No apparent anesthesia complications

## 2018-12-07 ENCOUNTER — Encounter: Payer: Self-pay | Admitting: Ophthalmology

## 2018-12-07 NOTE — Anesthesia Postprocedure Evaluation (Signed)
Anesthesia Post Note  Patient: Brooke Mcdowell  Procedure(s) Performed: CATARACT EXTRACTION PHACO AND INTRAOCULAR LENS PLACEMENT (IOC)-RIGHT (Right Eye)  Patient location during evaluation: PACU Anesthesia Type: MAC Level of consciousness: awake and alert Pain management: pain level controlled Vital Signs Assessment: post-procedure vital signs reviewed and stable Respiratory status: spontaneous breathing, nonlabored ventilation and respiratory function stable Cardiovascular status: stable and blood pressure returned to baseline Postop Assessment: no apparent nausea or vomiting Anesthetic complications: no     Last Vitals:  Vitals:   12/06/18 1020 12/06/18 1032  BP: (!) 143/62 (!) 152/63  Pulse: (!) 55 (!) 51  Resp: 16 16  Temp: 36.6 C   SpO2: 94% 100%    Last Pain:  Vitals:   12/06/18 1032  TempSrc:   PainSc: [place]

## 2019-01-29 DIAGNOSIS — M5136 Other intervertebral disc degeneration, lumbar region: Secondary | ICD-10-CM | POA: Diagnosis not present

## 2019-01-29 DIAGNOSIS — M5416 Radiculopathy, lumbar region: Secondary | ICD-10-CM | POA: Diagnosis not present

## 2019-01-29 DIAGNOSIS — M48062 Spinal stenosis, lumbar region with neurogenic claudication: Secondary | ICD-10-CM | POA: Diagnosis not present

## 2019-02-20 ENCOUNTER — Other Ambulatory Visit: Payer: Self-pay | Admitting: Physician Assistant

## 2019-02-20 DIAGNOSIS — Z961 Presence of intraocular lens: Secondary | ICD-10-CM | POA: Diagnosis not present

## 2019-02-20 DIAGNOSIS — G4700 Insomnia, unspecified: Secondary | ICD-10-CM

## 2019-02-21 ENCOUNTER — Other Ambulatory Visit: Payer: Self-pay | Admitting: Physician Assistant

## 2019-02-21 DIAGNOSIS — F419 Anxiety disorder, unspecified: Secondary | ICD-10-CM

## 2019-02-21 NOTE — Telephone Encounter (Signed)
Pharmacy requesting refills. Thanks!  

## 2019-02-21 NOTE — Telephone Encounter (Signed)
Please review

## 2019-03-12 DIAGNOSIS — J019 Acute sinusitis, unspecified: Secondary | ICD-10-CM | POA: Diagnosis not present

## 2019-03-12 DIAGNOSIS — J309 Allergic rhinitis, unspecified: Secondary | ICD-10-CM | POA: Diagnosis not present

## 2019-03-12 DIAGNOSIS — R05 Cough: Secondary | ICD-10-CM | POA: Diagnosis not present

## 2019-03-12 DIAGNOSIS — R51 Headache: Secondary | ICD-10-CM | POA: Diagnosis not present

## 2019-03-12 DIAGNOSIS — K219 Gastro-esophageal reflux disease without esophagitis: Secondary | ICD-10-CM | POA: Diagnosis not present

## 2019-03-18 DIAGNOSIS — G43719 Chronic migraine without aura, intractable, without status migrainosus: Secondary | ICD-10-CM | POA: Diagnosis not present

## 2019-03-19 DIAGNOSIS — R05 Cough: Secondary | ICD-10-CM | POA: Diagnosis not present

## 2019-03-19 DIAGNOSIS — R0600 Dyspnea, unspecified: Secondary | ICD-10-CM | POA: Diagnosis not present

## 2019-03-20 ENCOUNTER — Other Ambulatory Visit: Payer: Self-pay | Admitting: Specialist

## 2019-03-20 DIAGNOSIS — R0600 Dyspnea, unspecified: Secondary | ICD-10-CM | POA: Diagnosis not present

## 2019-03-20 DIAGNOSIS — R0609 Other forms of dyspnea: Secondary | ICD-10-CM

## 2019-03-20 DIAGNOSIS — R05 Cough: Secondary | ICD-10-CM | POA: Diagnosis not present

## 2019-03-20 DIAGNOSIS — R059 Cough, unspecified: Secondary | ICD-10-CM

## 2019-03-25 DIAGNOSIS — G43719 Chronic migraine without aura, intractable, without status migrainosus: Secondary | ICD-10-CM | POA: Diagnosis not present

## 2019-04-01 DIAGNOSIS — G43819 Other migraine, intractable, without status migrainosus: Secondary | ICD-10-CM | POA: Diagnosis not present

## 2019-04-01 DIAGNOSIS — E538 Deficiency of other specified B group vitamins: Secondary | ICD-10-CM | POA: Diagnosis not present

## 2019-04-01 DIAGNOSIS — E559 Vitamin D deficiency, unspecified: Secondary | ICD-10-CM | POA: Diagnosis not present

## 2019-04-02 ENCOUNTER — Ambulatory Visit
Admission: RE | Admit: 2019-04-02 | Discharge: 2019-04-02 | Disposition: A | Discharge status: Home / Self Care | Payer: PPO | Source: Ambulatory Visit | Attending: Specialist | Admitting: Specialist

## 2019-04-02 ENCOUNTER — Other Ambulatory Visit: Payer: Self-pay

## 2019-04-02 DIAGNOSIS — R0609 Other forms of dyspnea: Secondary | ICD-10-CM | POA: Diagnosis not present

## 2019-04-02 DIAGNOSIS — R0602 Shortness of breath: Secondary | ICD-10-CM | POA: Diagnosis not present

## 2019-04-02 DIAGNOSIS — R05 Cough: Secondary | ICD-10-CM

## 2019-04-02 DIAGNOSIS — R059 Cough, unspecified: Secondary | ICD-10-CM

## 2019-04-08 DIAGNOSIS — G43719 Chronic migraine without aura, intractable, without status migrainosus: Secondary | ICD-10-CM | POA: Diagnosis not present

## 2019-04-09 DIAGNOSIS — R05 Cough: Secondary | ICD-10-CM | POA: Diagnosis not present

## 2019-04-09 DIAGNOSIS — J310 Chronic rhinitis: Secondary | ICD-10-CM | POA: Diagnosis not present

## 2019-04-16 DIAGNOSIS — J301 Allergic rhinitis due to pollen: Secondary | ICD-10-CM | POA: Diagnosis not present

## 2019-05-06 DIAGNOSIS — R05 Cough: Secondary | ICD-10-CM | POA: Diagnosis not present

## 2019-05-06 DIAGNOSIS — K219 Gastro-esophageal reflux disease without esophagitis: Secondary | ICD-10-CM | POA: Diagnosis not present

## 2019-05-06 DIAGNOSIS — J310 Chronic rhinitis: Secondary | ICD-10-CM | POA: Diagnosis not present

## 2019-05-26 ENCOUNTER — Other Ambulatory Visit: Payer: Self-pay | Admitting: Physician Assistant

## 2019-05-26 DIAGNOSIS — G4700 Insomnia, unspecified: Secondary | ICD-10-CM

## 2019-06-03 DIAGNOSIS — J310 Chronic rhinitis: Secondary | ICD-10-CM | POA: Diagnosis not present

## 2019-06-03 DIAGNOSIS — R05 Cough: Secondary | ICD-10-CM | POA: Diagnosis not present

## 2019-06-03 DIAGNOSIS — K219 Gastro-esophageal reflux disease without esophagitis: Secondary | ICD-10-CM | POA: Diagnosis not present

## 2019-06-13 DIAGNOSIS — L820 Inflamed seborrheic keratosis: Secondary | ICD-10-CM | POA: Diagnosis not present

## 2019-06-13 DIAGNOSIS — L02423 Furuncle of right upper limb: Secondary | ICD-10-CM | POA: Diagnosis not present

## 2019-06-13 DIAGNOSIS — L538 Other specified erythematous conditions: Secondary | ICD-10-CM | POA: Diagnosis not present

## 2019-06-13 DIAGNOSIS — D235 Other benign neoplasm of skin of trunk: Secondary | ICD-10-CM | POA: Diagnosis not present

## 2019-06-13 DIAGNOSIS — D485 Neoplasm of uncertain behavior of skin: Secondary | ICD-10-CM | POA: Diagnosis not present

## 2019-06-13 NOTE — Progress Notes (Signed)
Subjective:   Brooke Mcdowell is a 68 y.o. female who presents for Medicare Annual (Subsequent) preventive examination.    This visit is being conducted through telemedicine due to the COVID-19 pandemic. This patient has given me verbal consent via doximity to conduct this visit, patient states they are participating from their home address. Some vital signs may be absent or patient reported.    Patient identification: identified by name, DOB, and current address  Review of Systems:  N/A  Cardiac Risk Factors include: advanced age (>53men, >80 women)     Objective:     Vitals: There were no vitals taken for this visit.  There is no height or weight on file to calculate BMI. Unable to obtain vitals due to visit being conducted via telephonically.   Advanced Directives 06/17/2019 06/15/2018 06/13/2017  Does Patient Have a Medical Advance Directive? Yes Yes Yes  Type of Paramedic of [place];Living will [place];Living will Living will  Copy of [place] in Chart? No - copy requested No - copy requested -    Tobacco Social History   Tobacco Use  Smoking Status Never Smoker  Smokeless Tobacco Never Used     Counseling given: Not Answered   Clinical Intake:  Pre-visit preparation completed: Yes  Pain : No/denies pain Pain Score: 0-No pain     Nutritional Risks: None Diabetes: No  How often do you need to have someone help you when you read instructions, pamphlets, or other written materials from your doctor or pharmacy?: 1 - Never  Interpreter Needed?: No  Information entered by :: [facility], LPN  Past Medical History:  Diagnosis Date  . Arthropathy of hand   . Dysthymic disorder   . Esophageal reflux   . History of kidney stones   . Insomnia   . Lumbar herniated disc   . Migraine   . Panic disorder   . Pure hypercholesterolemia   . Vitamin D deficiency    Past Surgical History:  Procedure  Laterality Date  . CATARACT EXTRACTION W/PHACO Right 12/06/2018   Procedure: CATARACT EXTRACTION PHACO AND INTRAOCULAR LENS PLACEMENT (IOC)-RIGHT;  Surgeon: Marchia Meiers, MD;  Location: [facility] ORS;  Service: Ophthalmology;  Laterality: Right;  [place] 01:28.9 CDE 11.98 Fluid Pack Lot # [id number]  . clot removal from right sinus after surgery to remove tumor in 1995    . LITHOTRIPSY  [phone]   renal stones  . TUBAL LIGATION    . TUMOR REMOVAL     behind right ete in sinuses- 1995 Dr. Pryor Ochoa   Family History  Problem Relation Age of Onset  . Brain cancer Mother   . Cancer Mother        lung  . Cancer Father        lung   . Brain cancer Father    Social History   Socioeconomic History  . Marital status: Divorced    Spouse name: Not on file  . Number of children: 2  . Years of education: Not on file  . Highest education level: Some college, no degree  Occupational History  . Occupation: retired  Scientific laboratory technician  . Financial resource strain: Not hard at all  . Food insecurity    Worry: Never true    Inability: Never true  . Transportation needs    Medical: No    Non-medical: No  Tobacco Use  . Smoking status: Never Smoker  . Smokeless tobacco: Never Used  Substance and  Sexual Activity  . Alcohol use: Yes    Comment: rarely - 1/2 drinks @ a time  . Drug use: No  . Sexual activity: Not on file  Lifestyle  . Physical activity    Days per week: 0 days    Minutes per session: 0 min  . Stress: Not at all  Relationships  . Social Herbalist on phone: Patient refused    Gets together: Patient refused    Attends religious service: Patient refused    Active member of club or organization: Patient refused    Attends meetings of clubs or organizations: Patient refused    Relationship status: Patient refused  Other Topics Concern  . Not on file  Social History Narrative  . Not on file    Outpatient Encounter Medications as of 06/17/2019  Medication Sig  .  albuterol (VENTOLIN HFA) 108 (90 Base) MCG/ACT inhaler Inhale 1-2 puffs into the lungs every 4 (four) hours as needed.   . chlorpheniramine-HYDROcodone (TUSSIONEX PENNKINETIC ER) 10-8 MG/5ML SUER Take 5 mLs by mouth at bedtime as needed for cough.  . EQ GENTLE LUBRICANT 0.3 % SOLN Place 1 drop into both eyes at bedtime as needed (dry/irritated eyes.).  Marland Kitchen estradiol (ESTRACE) 1 MG tablet TAKE 1 TABLET BY MOUTH DAILY (Patient taking differently: Take 1 mg by mouth every evening. )  . fluticasone (FLONASE) 50 MCG/ACT nasal spray Place 2 sprays into both nostrils daily. (Patient taking differently: Place 2 sprays into both nostrils every evening. )  . Magnesium 250 MG TABS Take 500 mg by mouth every evening.   . medroxyPROGESTERone (PROVERA) 2.5 MG tablet TAKE 1 TABLET(2.5 MG) BY MOUTH DAILY (Patient taking differently: Take 2.5 mg by mouth every evening. )  . montelukast (SINGULAIR) 10 MG tablet Take 10 mg by mouth at bedtime.   . Multiple Vitamins-Minerals (ADULT GUMMY PO) Take 2 tablets by mouth every evening. Vitafusion MultiVites Gummy  . omeprazole (PRILOSEC) 40 MG capsule Take 1 capsule (40 mg total) by mouth daily. (Patient taking differently: Take 40 mg by mouth daily as needed (heartburn/indigestion.). )  . predniSONE (DELTASONE) 10 MG tablet Take 10 mg by mouth daily.  . rizatriptan (MAXALT) 10 MG tablet Take 1 tablet for migraine. May repeat in 2 hours if needed. Maximum 30 mg daily. (Patient taking differently: Take 5 mg by mouth every 2 (two) hours as needed for migraine (Take 1 tablet for migraine. May repeat in 2 hours if needed. Maximum 30 mg daily.). )  . sertraline (ZOLOFT) 50 MG tablet Take 1 tablet (50 mg total) by mouth at bedtime.  Marland Kitchen zolpidem (AMBIEN) 10 MG tablet TAKE 1 TABLET(10 MG) BY MOUTH AT BEDTIME AS NEEDED FOR SLEEP  . HYDROcodone-homatropine (HYCODAN) 5-1.5 MG/5ML syrup Take 5 mLs by mouth at bedtime as needed for cough. (Patient not taking: Reported on 12/03/2018)  .  levocetirizine (XYZAL) 5 MG tablet Take 5 mg by mouth every evening.   No facility-administered encounter medications on file as of 06/17/2019.     Activities of Daily Living In your present state of health, do you have any difficulty performing the following activities: 06/17/2019 09/13/2018  Hearing? N N  Vision? N N  Comment Wears eye glasses. -  Difficulty concentrating or making decisions? N N  Walking or climbing stairs? N N  Dressing or bathing? N N  Doing errands, shopping? N N  Preparing Food and eating ? N -  Using the Toilet? N -  In the past  six months, have you accidently leaked urine? N -  Do you have problems with loss of bowel control? N -  Managing your Medications? N -  Managing your Finances? N -  Housekeeping or managing your Housekeeping? N -  Some recent data might be hidden    Patient Care Team: Paulene Floor as PCP - General (Physician Assistant) Sharlet Salina, MD as Referring Physician (Physical Medicine and Rehabilitation) Clyde Canterbury, MD as Referring Physician (Otolaryngology) Marchia Meiers, MD as Consulting Physician (Ophthalmology) Erby Pian, MD as Referring Physician (Specialist) Vladimir Crofts, MD as Consulting Physician (Neurology)    Assessment:   This is a routine wellness examination for Thanya.  Exercise Activities and Dietary recommendations Current Exercise Habits: Home exercise routine, Type of exercise: walking, Time (Minutes): 40, Frequency (Times/Week): 3, Weekly Exercise (Minutes/Week): 120, Intensity: Mild, Exercise limited by: None identified  Goals    . Increase water intake     Recommend increasing water intake to 4-6 glasses a day.        Fall Risk: Fall Risk  06/17/2019 09/13/2018 06/15/2018 06/13/2017  Falls in the past year? 0 0 No No  Number falls in past yr: - 0 - -  Injury with Fall? - 0 - -    FALL RISK PREVENTION PERTAINING TO THE HOME:  Any stairs in or around the home? Yes  If so, are  there any without handrails? No   Home free of loose throw rugs in walkways, pet beds, electrical cords, etc? Yes  Adequate lighting in your home to reduce risk of falls? Yes   ASSISTIVE DEVICES UTILIZED TO PREVENT FALLS:  Life alert? No  Use of a cane, walker or w/c? No  Grab bars in the bathroom? No  Shower chair or bench in shower? Yes  Elevated toilet seat or a handicapped toilet? No    TIMED UP AND GO:  Was the test performed? No .    Depression Screen PHQ 2/9 Scores 06/17/2019 09/13/2018 06/15/2018 09/22/2017  PHQ - 2 Score 0 0 0 0  PHQ- 9 Score - 2 - 4     Cognitive Function     6CIT Screen 06/13/2017  What Year? 0 points  What month? 0 points  What time? 0 points  Count back from 20 0 points  Months in reverse 0 points  Repeat phrase 4 points  Total Score 4    Immunization History  Administered Date(s) Administered  . Influenza, High Dose Seasonal PF 07/20/2017, 06/07/2019  . Influenza-Unspecified 06/29/2018  . Pneumococcal Conjugate-13 06/13/2017  . Pneumococcal Polysaccharide-23 07/03/2018  . Tdap 03/17/2017  . Zoster 04/26/2013    Qualifies for Shingles Vaccine? Yes  Zostavax completed 04/26/13. Due for Shingrix. Education has been provided regarding the importance of this vaccine. Pt has been advised to call insurance company to determine out of pocket expense. Advised may also receive vaccine at local pharmacy or Health Dept. Verbalized acceptance and understanding.  Tdap: Up to date  Flu Vaccine: Due for Flu vaccine. Does the patient want to receive this vaccine today?  No .   Pneumococcal Vaccine: Completed series  Screening Tests Health Maintenance  Topic Date Due  . MAMMOGRAM  10/08/2020  . COLONOSCOPY  12/19/2020  . DEXA SCAN  10/30/2022  . TETANUS/TDAP  03/18/2027  . INFLUENZA VACCINE  Completed  . Hepatitis C Screening  Completed  . PNA vac Low Risk Adult  Completed    Cancer Screenings:  Colorectal Screening: Completed 12/20/10.  Repeat  every 10 years.  Mammogram: Completed 10/08/2018.   Bone Density: Completed 10/30/17. Results reflect OSTEOPENIA. Repeat every 5 years.   Lung Cancer Screening: (Low Dose CT Chest recommended if Age 59-80 years, 30 pack-year currently smoking OR have quit w/in 15years.) does not qualify.   Additional Screening:  Hepatitis C Screening: Up to date  Vision Screening: Recommended annual ophthalmology exams for early detection of glaucoma and other disorders of the eye.  Dental Screening: Recommended annual dental exams for proper oral hygiene  Community Resource Referral:  CRR required this visit?  No       Plan:  I have personally reviewed and addressed the Medicare Annual Wellness questionnaire and have noted the following in the patient's chart:  A. Medical and social history B. Use of alcohol, tobacco or illicit drugs  C. Current medications and supplements D. Functional ability and status E.  Nutritional status F.  Physical activity G. Advance directives H. List of other physicians I.  Hospitalizations, surgeries, and ER visits in previous 12 months J.  [place] such as hearing and vision if needed, cognitive and depression L. Referrals and appointments   In addition, I have reviewed and discussed with patient certain preventive protocols, quality metrics, and best practice recommendations. A written personalized care plan for preventive services as well as general preventive health recommendations were provided to patient. Nurse Health Advisor  Signed,    Malosi Hemstreet [place], [place]  [id number] Nurse Health Advisor   Nurse Notes: None.

## 2019-06-17 ENCOUNTER — Other Ambulatory Visit: Payer: Self-pay

## 2019-06-17 ENCOUNTER — Ambulatory Visit (INDEPENDENT_AMBULATORY_CARE_PROVIDER_SITE_OTHER): Payer: PPO

## 2019-06-17 DIAGNOSIS — Z0000 Encounter for general adult medical examination without abnormal findings: Secondary | ICD-10-CM | POA: Diagnosis not present

## 2019-06-17 NOTE — Patient Instructions (Signed)
Brooke Mcdowell , Thank you for taking time to come for your Medicare Wellness Visit. I appreciate your ongoing commitment to your health goals. Please review the following plan we discussed and let me know if I can assist you in the future.   Screening recommendations/referrals: Colonoscopy: Up to date, due 12/2020 Mammogram: Up to date, due 10/2020 Bone Density: Up to date, due 04/2023 Recommended yearly ophthalmology/optometry visit for glaucoma screening and checkup Recommended yearly dental visit for hygiene and checkup  Vaccinations: Influenza vaccine: Up to date Pneumococcal vaccine: Completed series Tdap vaccine: Up to date, due 06/2025 Shingles vaccine: Pt declines today.     Advanced directives: Please bring a copy of your POA (Power of Attorney) and/or Living Will to your next appointment.   Conditions/risks identified: Continue to increase water intake to 6-8 8 oz glasses daily.   Next appointment: 09/17/19 @ 2:00 PM with [name].    Preventive Care 69 Years and Older, Female Preventive care refers to lifestyle choices and visits with your health care provider that can promote health and wellness. What does preventive care include?  A yearly physical exam. This is also called an annual well check.  Dental exams once or twice a year.  Routine eye exams. Ask your health care provider how often you should have your eyes checked.  Personal lifestyle choices, including:  Daily care of your teeth and gums.  Regular physical activity.  Eating a healthy diet.  Avoiding tobacco and drug use.  Limiting alcohol use.  Practicing safe sex.  Taking low-dose aspirin every day.  Taking vitamin and mineral supplements as recommended by your health care provider. What happens during an annual well check? The services and screenings done by your health care provider during your annual well check will depend on your age, overall health, lifestyle risk factors, and family  history of disease. Counseling  Your health care provider may ask you questions about your:  Alcohol use.  Tobacco use.  Drug use.  Emotional well-being.  Home and relationship well-being.  Sexual activity.  Eating habits.  History of falls.  Memory and ability to understand (cognition).  Work and work Statistician.  Reproductive health. Screening  You may have the following tests or measurements:  Height, weight, and BMI.  Blood pressure.  Lipid and cholesterol levels. These may be checked every 5 years, or more frequently if you are over 69 years old.  Skin check.  Lung cancer screening. You may have this screening every year starting at age 69 if you have a 30-pack-year history of smoking and currently smoke or have quit within the past 15 years.  Fecal occult blood test (FOBT) of the stool. You may have this test every year starting at age 69.  Flexible sigmoidoscopy or colonoscopy. You may have a sigmoidoscopy every 5 years or a colonoscopy every 10 years starting at age 69.  Hepatitis C blood test.  Hepatitis B blood test.  Sexually transmitted disease (STD) testing.  Diabetes screening. This is done by checking your blood sugar (glucose) after you have not eaten for a while (fasting). You may have this done every 1-3 years.  Bone density scan. This is done to screen for osteoporosis. You may have this done starting at age 69.  Mammogram. This may be done every 1-2 years. Talk to your health care provider about how often you should have regular mammograms. Talk with your health care provider about your test results, treatment options, and if necessary, the need for  more tests. Vaccines  Your health care provider may recommend certain vaccines, such as:  Influenza vaccine. This is recommended every year.  Tetanus, diphtheria, and acellular pertussis (Tdap, Td) vaccine. You may need a Td booster every 10 years.  Zoster vaccine. You may need this after  age 45.  Pneumococcal 13-valent conjugate (PCV13) vaccine. One dose is recommended after age 70.  Pneumococcal polysaccharide (PPSV23) vaccine. One dose is recommended after age 69. Talk to your health care provider about which screenings and vaccines you need and how often you need them. This information is not intended to replace advice given to you by your health care provider. Make sure you discuss any questions you have with your health care provider. Document Released: 10/16/2015 Document Revised: 06/08/2016 Document Reviewed: 07/21/2015 Elsevier Interactive Patient Education  2017 [place] Prevention in the Home Falls can cause injuries. They can happen to people of all ages. There are many things you can do to make your home safe and to help prevent falls. What can I do on the outside of my home?  Regularly fix the edges of walkways and driveways and fix any cracks.  Remove anything that might make you trip as you walk through a door, such as a raised step or threshold.  Trim any bushes or trees on the path to your home.  Use bright outdoor lighting.  Clear any walking paths of anything that might make someone trip, such as rocks or tools.  Regularly check to see if handrails are loose or broken. Make sure that both sides of any steps have handrails.  Any raised decks and porches should have guardrails on the edges.  Have any leaves, snow, or ice cleared regularly.  Use sand or salt on walking paths during winter.  Clean up any spills in your garage right away. This includes oil or grease spills. What can I do in the bathroom?  Use night lights.  Install grab bars by the toilet and in the tub and shower. Do not use towel bars as grab bars.  Use non-skid mats or decals in the tub or shower.  If you need to sit down in the shower, use a plastic, non-slip stool.  Keep the floor dry. Clean up any water that spills on the floor as soon as it happens.   Remove soap buildup in the tub or shower regularly.  Attach bath mats securely with double-sided non-slip rug tape.  Do not have throw rugs and other things on the floor that can make you trip. What can I do in the bedroom?  Use night lights.  Make sure that you have a light by your bed that is easy to reach.  Do not use any sheets or blankets that are too big for your bed. They should not hang down onto the floor.  Have a firm chair that has side arms. You can use this for support while you get dressed.  Do not have throw rugs and other things on the floor that can make you trip. What can I do in the kitchen?  Clean up any spills right away.  Avoid walking on wet floors.  Keep items that you use a lot in easy-to-reach places.  If you need to reach something above you, use a strong step stool that has a grab bar.  Keep electrical cords out of the way.  Do not use floor polish or wax that makes floors slippery. If you must use wax, use non-skid  floor wax.  Do not have throw rugs and other things on the floor that can make you trip. What can I do with my stairs?  Do not leave any items on the stairs.  Make sure that there are handrails on both sides of the stairs and use them. Fix handrails that are broken or loose. Make sure that handrails are as long as the stairways.  Check any carpeting to make sure that it is firmly attached to the stairs. Fix any carpet that is loose or worn.  Avoid having throw rugs at the top or bottom of the stairs. If you do have throw rugs, attach them to the floor with carpet tape.  Make sure that you have a light switch at the top of the stairs and the bottom of the stairs. If you do not have them, ask someone to add them for you. What else can I do to help prevent falls?  Wear shoes that:  Do not have high heels.  Have rubber bottoms.  Are comfortable and fit you well.  Are closed at the toe. Do not wear sandals.  If you use a  stepladder:  Make sure that it is fully opened. Do not climb a closed stepladder.  Make sure that both sides of the stepladder are locked into place.  Ask someone to hold it for you, if possible.  Clearly mark and make sure that you can see:  Any grab bars or handrails.  First and last steps.  Where the edge of each step is.  Use tools that help you move around (mobility aids) if they are needed. These include:  Canes.  Walkers.  Scooters.  Crutches.  Turn on the lights when you go into a dark area. Replace any light bulbs as soon as they burn out.  Set up your furniture so you have a clear path. Avoid moving your furniture around.  If any of your floors are uneven, fix them.  If there are any pets around you, be aware of where they are.  Review your medicines with your doctor. Some medicines can make you feel dizzy. This can increase your chance of falling. Ask your doctor what other things that you can do to help prevent falls. This information is not intended to replace advice given to you by your health care provider. Make sure you discuss any questions you have with your health care provider. Document Released: 07/16/2009 Document Revised: 02/25/2016 Document Reviewed: 10/24/2014 Elsevier Interactive Patient Education  2017 [employer].

## 2019-06-28 DIAGNOSIS — G43719 Chronic migraine without aura, intractable, without status migrainosus: Secondary | ICD-10-CM | POA: Diagnosis not present

## 2019-07-01 ENCOUNTER — Encounter: Payer: Self-pay | Admitting: Physician Assistant

## 2019-07-01 ENCOUNTER — Other Ambulatory Visit: Payer: Self-pay

## 2019-07-01 ENCOUNTER — Ambulatory Visit (INDEPENDENT_AMBULATORY_CARE_PROVIDER_SITE_OTHER): Payer: PPO | Admitting: Physician Assistant

## 2019-07-01 VITALS — BP 138/85 | HR 77 | Temp 99.0°F | Wt 116.0 lb

## 2019-07-01 DIAGNOSIS — J029 Acute pharyngitis, unspecified: Secondary | ICD-10-CM

## 2019-07-01 DIAGNOSIS — R519 Headache, unspecified: Secondary | ICD-10-CM

## 2019-07-01 DIAGNOSIS — R0981 Nasal congestion: Secondary | ICD-10-CM

## 2019-07-01 DIAGNOSIS — R51 Headache: Secondary | ICD-10-CM | POA: Diagnosis not present

## 2019-07-01 MED ORDER — AMOXICILLIN 875 MG PO TABS: 875.0000 mg | ORAL_TABLET | Freq: Two times a day (BID) | ORAL | 0 refills | Status: AC

## 2019-07-01 NOTE — Progress Notes (Signed)
Patient: Brooke Mcdowell Female    DOB: October 17, 1950   68 y.o.   MRN: [id number] Visit Date: 07/01/2019  Today's Provider: Trinna Post, PA-C   Chief Complaint  Patient presents with  . URI   Subjective:    Virtual Visit via Telephone Note  I connected with [name] on 07/01/19 at  3:00 PM EDT by telephone and verified that I am speaking with the correct person using two identifiers.  Location: Patient: Home Provider: Office   I discussed the limitations, risks, security and privacy concerns of performing an evaluation and management service by telephone and the availability of in person appointments. I also discussed with the patient that there may be a patient responsible charge related to this service. The patient expressed understanding and agreed to proceed.   HPI Upper Respiratory Infection: Patient complains of symptoms of a URI, possible sinusitis. Initially thought she was having a migraine. 06/28/2019 she visited her neurologist at [facility] and was treated with toradol and had her migraine medications changed.  Symptoms include sore throat and head ache. Onset of symptoms was 2 week ago, gradually worsening since that time. She also c/o low grade fever, non productive cough, sinus pressure and sore throat for the past 1 day. Highest temperature was 99.4F.  She is drinking plenty of fluids. Evaluation to date: none. Treatment to date: advil. Her eye was matted shut on the right. She is coughing some in the morning. Denies COVID contacts, she wears mask when she goes out. Denies SOB. Reports pain is concentrated in the face.     Allergies  Allergen Reactions  . Gabapentin Other (See Comments)    Caused migraines  . Latex Itching and Other (See Comments)    redness  . Tetracyclines & Related Itching and Nausea And Vomiting  . Sulfa Antibiotics Rash     Current Outpatient Medications:  .  albuterol (VENTOLIN HFA) 108 (90 Base) MCG/ACT inhaler, Inhale  1-2 puffs into the lungs every 4 (four) hours as needed. , Disp: , Rfl:  .  EQ GENTLE LUBRICANT 0.3 % SOLN, Place 1 drop into both eyes at bedtime as needed (dry/irritated eyes.)., Disp: , Rfl:  .  estradiol (ESTRACE) 1 MG tablet, TAKE 1 TABLET BY MOUTH DAILY (Patient taking differently: Take 1 mg by mouth every evening. ), Disp: 90 tablet, Rfl: 0 .  Magnesium 250 MG TABS, Take 500 mg by mouth every evening. , Disp: , Rfl:  .  medroxyPROGESTERone (PROVERA) 2.5 MG tablet, TAKE 1 TABLET(2.5 MG) BY MOUTH DAILY (Patient taking differently: Take 2.5 mg by mouth every evening. ), Disp: 90 tablet, Rfl: 0 .  montelukast (SINGULAIR) 10 MG tablet, Take 10 mg by mouth at bedtime. , Disp: , Rfl:  .  Multiple Vitamins-Minerals (ADULT GUMMY PO), Take 2 tablets by mouth every evening. Vitafusion MultiVites Gummy, Disp: , Rfl:  .  omeprazole (PRILOSEC) 40 MG capsule, Take 1 capsule (40 mg total) by mouth daily. (Patient taking differently: Take 40 mg by mouth daily as needed (heartburn/indigestion.). ), Disp: 90 capsule, Rfl: 3 .  predniSONE (DELTASONE) 10 MG tablet, Take 10 mg by mouth daily., Disp: , Rfl:  .  rizatriptan (MAXALT) 10 MG tablet, Take 1 tablet for migraine. May repeat in 2 hours if needed. Maximum 30 mg daily. (Patient taking differently: Take 5 mg by mouth every 2 (two) hours as needed for migraine (Take 1 tablet for migraine. May repeat in 2 hours if  needed. Maximum 30 mg daily.). ), Disp: 10 tablet, Rfl: 3 .  sertraline (ZOLOFT) 50 MG tablet, Take 1 tablet (50 mg total) by mouth at bedtime., Disp: 90 tablet, Rfl: 1 .  zolpidem (AMBIEN) 10 MG tablet, TAKE 1 TABLET(10 MG) BY MOUTH AT BEDTIME AS NEEDED FOR SLEEP, Disp: 30 tablet, Rfl: 1 .  chlorpheniramine-HYDROcodone (TUSSIONEX PENNKINETIC ER) 10-8 MG/5ML SUER, Take 5 mLs by mouth at bedtime as needed for cough. (Patient not taking: Reported on 07/01/2019), Disp: 140 mL, Rfl: 0 .  fluticasone (FLONASE) 50 MCG/ACT nasal spray, Place 2 sprays into both  nostrils daily. (Patient not taking: Reported on 07/01/2019), Disp: 16 g, Rfl: 6 .  levocetirizine (XYZAL) 5 MG tablet, Take 5 mg by mouth every evening., Disp: , Rfl:   Review of Systems  Constitutional: Positive for fever.  HENT: Positive for sinus pressure, sinus pain and sore throat.   Respiratory: Positive for cough.   Musculoskeletal: Positive for neck pain.  Neurological: Positive for headaches.    Social History   Tobacco Use  . Smoking status: Never Smoker  . Smokeless tobacco: Never Used  Substance Use Topics  . Alcohol use: Yes    Comment: rarely - 1/2 drinks @ a time      Objective:   BP 138/85 (BP Location: Right Arm, Patient Position: Sitting, Cuff Size: Normal)   Pulse 77   Temp 99 F (37.2 C)   Wt 116 lb (52.6 kg)   BMI 21.92 kg/m  Vitals:   07/01/19 1507  BP: 138/85  Pulse: 77  Temp: 99 F (37.2 C)  Weight: 116 lb (52.6 kg)  Body mass index is 21.92 kg/m.   Physical Exam   No results found for any visits on 07/01/19.     Assessment & Plan     1. Sore throat   2. Nonintractable headache, unspecified chronicity pattern, unspecified headache type   3. Sinus congestion  - amoxicillin (AMOXIL) 875 MG tablet; Take 1 tablet (875 mg total) by mouth 2 (two) times daily for 10 days.  Dispense: 20 tablet; Refill: 0  I discussed the assessment and treatment plan with the patient. The patient was provided an opportunity to ask questions and all were answered. The patient agreed with the plan and demonstrated an understanding of the instructions.   The patient was advised to call back or seek an in-person evaluation if the symptoms worsen or if the condition fails to improve as anticipated.  The entirety of the information documented in the History of Present Illness, Review of Systems and Physical Exam were personally obtained by me. Portions of this information were initially documented by Lynford Humphrey, CMA and reviewed by me for thoroughness and  accuracy.      Trinna Post, PA-C  [facility]

## 2019-07-02 ENCOUNTER — Other Ambulatory Visit: Payer: Self-pay

## 2019-07-02 DIAGNOSIS — Z20822 Contact with and (suspected) exposure to covid-19: Secondary | ICD-10-CM

## 2019-07-03 LAB — NOVEL CORONAVIRUS, NAA: SARS-CoV-2, NAA: NOT DETECTED

## 2019-07-05 NOTE — Patient Instructions (Signed)

## 2019-07-26 DIAGNOSIS — M5481 Occipital neuralgia: Secondary | ICD-10-CM | POA: Diagnosis not present

## 2019-07-29 ENCOUNTER — Other Ambulatory Visit: Payer: Self-pay | Admitting: Gastroenterology

## 2019-07-30 ENCOUNTER — Other Ambulatory Visit: Payer: Self-pay | Admitting: Physician Assistant

## 2019-07-30 DIAGNOSIS — G4700 Insomnia, unspecified: Secondary | ICD-10-CM

## 2019-07-30 DIAGNOSIS — F419 Anxiety disorder, unspecified: Secondary | ICD-10-CM

## 2019-07-30 MED ORDER — ZOLPIDEM TARTRATE 10 MG PO TABS: ORAL_TABLET | ORAL | 1 refills | Status: DC

## 2019-07-30 MED ORDER — SERTRALINE HCL 50 MG PO TABS: 50.0000 mg | ORAL_TABLET | Freq: Every day | ORAL | 1 refills | Status: DC

## 2019-07-30 NOTE — Telephone Encounter (Signed)
L.O.V. was on 07/01/2019 and upcoming appointment is 09/17/2019, please advise.

## 2019-07-30 NOTE — Telephone Encounter (Signed)
[facility] faxed refill request for the following medications:  1. sertraline (ZOLOFT) 50 MG tablet 2. zolpidem (AMBIEN) 10 MG tablet  LOV: 07/01/2019 Please advise. Thanks TNP

## 2019-07-30 NOTE — Telephone Encounter (Signed)
[place] is requesting refill authorizations for Sertraline 50 mg.

## 2019-08-19 DIAGNOSIS — H26491 Other secondary cataract, right eye: Secondary | ICD-10-CM | POA: Diagnosis not present

## 2019-08-28 ENCOUNTER — Telehealth: Payer: Self-pay | Admitting: Physician Assistant

## 2019-08-28 NOTE — Telephone Encounter (Signed)
The patient was willing to come in to the Office to see Laverna Peace FNP-C on Friday 08/30/2019 and was advised if it worsens not to hesitate to go to an urgent care or to the ED if needed. She gave verbal understnding.

## 2019-08-28 NOTE — Telephone Encounter (Signed)
Pt called saying she has been having very chapped swollen lips at first white spots too but they have gone away.  She says it is very painful.  She has used Abreva and Vaseline and even a mixture of milk of magnesia and benadryl but nothing has helped.  She wants to know if it could possibly be thrush.  She also stated she has been taking a probiotics for htree weeks but has stopped thinking that could have caused this.  Please advise  Best #  [phone]  [facility] on [address]

## 2019-08-28 NOTE — Telephone Encounter (Signed)
Hard for me to tell without evaluating. Sounds more like a cold sore. Topical treatments not effective. I'd have to see her to know for sure. 3:40 PM open today for virtual visit.

## 2019-08-30 ENCOUNTER — Other Ambulatory Visit: Payer: Self-pay

## 2019-08-30 ENCOUNTER — Ambulatory Visit (INDEPENDENT_AMBULATORY_CARE_PROVIDER_SITE_OTHER): Payer: PPO | Admitting: Adult Health

## 2019-08-30 ENCOUNTER — Encounter: Payer: Self-pay | Admitting: Adult Health

## 2019-08-30 VITALS — BP 122/72 | HR 67 | Temp 96.8°F | Resp 16 | Wt 118.2 lb

## 2019-08-30 DIAGNOSIS — B001 Herpesviral vesicular dermatitis: Secondary | ICD-10-CM | POA: Diagnosis not present

## 2019-08-30 MED ORDER — VALACYCLOVIR HCL 1 G PO TABS: 1000.0000 mg | ORAL_TABLET | Freq: Two times a day (BID) | ORAL | 1 refills | Status: DC

## 2019-08-30 NOTE — Progress Notes (Addendum)
Patient: Brooke Mcdowell Female    DOB: 05-31-1951   68 y.o.   MRN: [id number] Visit Date: 08/30/2019  Today's Provider: Marcille Buffy, FNP   Chief Complaint  Patient presents with  . Oral Pain   Allergies  Allergen Reactions  . Gabapentin Other (See Comments)    Caused migraines  . Latex Itching and Other (See Comments)    redness  . Tetracyclines & Related Itching and Nausea And Vomiting  . Sulfa Antibiotics Rash    Subjective:     Oral Pain  This is a new problem. The current episode started 1 to 4 weeks ago. The problem has been unchanged. The pain is moderate. Pertinent negatives include no difficulty swallowing, facial pain, fever, oral bleeding, sinus pressure or thermal sensitivity. Associated symptoms comments: Patient reports pain around her mouth, lips and inside her mouth .   She reports burning and tingling before she noticed any of the white spots in her mouth.  She had white tingling spots in her mouth on lower mucosa of the lip and upper mucosa of lip that started over 3 weeks ago.  She felt as if her lips were mildly swollen and had intense burning and were very painful.  Is her not her typical ulcers.  The white spots have resolved but she still has pain in the area with burning and tingling. She has been using Benadryl/ Milk of Magnesium.  Also used Abreva without any improvement. She has used diluted menthanol.  Feels her lips are very dry.  Advil/ Tylenol PRN.  Lower lip was swollen from irritation, she reports it has improved since onset. Denies any difficulty swallowing.   However reports that symptoms have improved since onset. She has not been ill. She is being treated for migraines/ occipital headaches.  She took prednisone for over 3 weeks. Migraines injections - at neurology Dr. Manuella Ghazi.  Prednisone  She had no new medications other than the probiotic that she started 3 weeks ago. He has had one episode of this in the past that was  treated with antivirals.  Was years ago. Denies any ill exposures. Patient  denies any fever, body aches,chills, rash, chest pain, shortness of breath, nausea, vomiting, or diarrhea.    Allergies  Allergen Reactions  . Gabapentin Other (See Comments)    Caused migraines  . Latex Itching and Other (See Comments)    redness  . Tetracyclines & Related Itching and Nausea And Vomiting  . Sulfa Antibiotics Rash     Current Outpatient Medications:  .  albuterol (VENTOLIN HFA) 108 (90 Base) MCG/ACT inhaler, Inhale 1-2 puffs into the lungs every 4 (four) hours as needed. , Disp: , Rfl:  .  ASPIRIN-ACETAMINOPHEN PO, Take by mouth., Disp: , Rfl:  .  cyclobenzaprine (FLEXERIL) 10 MG tablet, , Disp: , Rfl:  .  EMGALITY 120 MG/ML SOAJ, , Disp: , Rfl:  .  EQ GENTLE LUBRICANT 0.3 % SOLN, Place 1 drop into both eyes at bedtime as needed (dry/irritated eyes.)., Disp: , Rfl:  .  estradiol (ESTRACE) 1 MG tablet, TAKE 1 TABLET BY MOUTH DAILY (Patient taking differently: Take 1 mg by mouth every evening. ), Disp: 90 tablet, Rfl: 0 .  Magnesium 250 MG TABS, Take 500 mg by mouth every evening. , Disp: , Rfl:  .  medroxyPROGESTERone (PROVERA) 2.5 MG tablet, TAKE 1 TABLET(2.5 MG) BY MOUTH DAILY (Patient taking differently: Take 2.5 mg by mouth every evening. ), Disp: 90 tablet,  Rfl: 0 .  montelukast (SINGULAIR) 10 MG tablet, Take 10 mg by mouth at bedtime. , Disp: , Rfl:  .  Multiple Vitamins-Minerals (ADULT GUMMY PO), Take 2 tablets by mouth every evening. Vitafusion MultiVites Gummy, Disp: , Rfl:  .  omeprazole (PRILOSEC) 40 MG capsule, TAKE ONE CAPSULE BY MOUTH DAILY, Disp: 90 capsule, Rfl: 0 .  rizatriptan (MAXALT) 10 MG tablet, Take 1 tablet for migraine. May repeat in 2 hours if needed. Maximum 30 mg daily. (Patient taking differently: Take 5 mg by mouth every 2 (two) hours as needed for migraine (Take 1 tablet for migraine. May repeat in 2 hours if needed. Maximum 30 mg daily.). ), Disp: 10 tablet, Rfl: 3  .  sertraline (ZOLOFT) 50 MG tablet, Take 1 tablet (50 mg total) by mouth at bedtime., Disp: 90 tablet, Rfl: 1 .  UBRELVY 100 MG TABS, TK 1 T PO ONCE PRF HA. REPEAT IN 2 H PRN FOR UP TO 1 DOSE MDD 3 TS IN 24 H., Disp: , Rfl:  .  zolpidem (AMBIEN) 10 MG tablet, TAKE 1 TABLET(10 MG) BY MOUTH AT BEDTIME AS NEEDED FOR SLEEP, Disp: 30 tablet, Rfl: 1 .  fluticasone (FLONASE) 50 MCG/ACT nasal spray, Place 2 sprays into both nostrils daily. (Patient not taking: Reported on 07/01/2019), Disp: 16 g, Rfl: 6  Review of Systems  Constitutional: Negative for activity change, appetite change, chills, diaphoresis, fatigue, fever and unexpected weight change.  HENT: Positive for mouth sores (Mild lower lip swelling). Negative for congestion, dental problem, drooling, facial swelling, nosebleeds, postnasal drip, rhinorrhea, sinus pressure, sinus pain, sore throat and trouble swallowing.   Eyes: Negative for pain, discharge and itching.  Respiratory: Negative.  Negative for apnea, cough, choking, chest tightness, shortness of breath, wheezing and stridor.   Cardiovascular: Negative.  Negative for chest pain, palpitations and leg swelling.  Gastrointestinal: Negative.   Genitourinary: Negative.   Musculoskeletal: Negative.   Neurological: Negative.   Hematological: Negative.   Psychiatric/Behavioral: Negative.     Social History   Tobacco Use  . Smoking status: Never Smoker  . Smokeless tobacco: Never Used  Substance Use Topics  . Alcohol use: Yes    Comment: rarely - 1/2 drinks @ a time      Objective:   BP 122/72   Pulse 67   Temp (!) 96.8 F (36 C) (Oral)   Resp 16   Wt 118 lb 3.2 oz (53.6 kg)   SpO2 98%   BMI 22.33 kg/m  Vitals:   08/30/19 0836  BP: 122/72  Pulse: 67  Resp: 16  Temp: (!) 96.8 F (36 C)  TempSrc: Oral  SpO2: 98%  Weight: 118 lb 3.2 oz (53.6 kg)  Body mass index is 22.33 kg/m.   Physical Exam Vitals signs reviewed.  Constitutional:      Appearance: Normal  appearance.  HENT:     Head: Normocephalic.     Right Ear: Tympanic membrane normal.     Left Ear: Tympanic membrane normal.     Nose: Nose normal. No congestion or rhinorrhea.     Mouth/Throat:     Mouth: Mucous membranes are moist. Oral lesions present. No injury or angioedema.     Tongue: No lesions. Tongue does not deviate from midline.     Pharynx: Oropharynx is clear. Uvula midline. No oropharyngeal exudate or uvula swelling.     Tonsils: No tonsillar exudate or tonsillar abscesses.     Comments: No outer lip lesions.  Lips are dry. Lower  lip mild swelling.  Small ulcer under upper lip along vermilion border.  Lowe lip inner mucosa with small white lesions, appear to be resolving from picture shown to provider that patient took at there worst, 2 weeks ago. No facial or eye involvement.  Eyes:     General: No scleral icterus.       Right eye: No discharge.        Left eye: No discharge.     Pupils: Pupils are equal, round, and reactive to light.  Neck:     Musculoskeletal: Normal range of motion and neck supple.  Cardiovascular:     Rate and Rhythm: Normal rate and regular rhythm.     Pulses: Normal pulses.     Heart sounds: Normal heart sounds.  Pulmonary:     Effort: Pulmonary effort is normal.     Breath sounds: Normal breath sounds. No rhonchi.  Abdominal:     Palpations: Abdomen is soft.  Lymphadenopathy:     Cervical: Cervical adenopathy present.     Right cervical: Superficial cervical adenopathy (soft mild tenderness, mobile/ mild enlargement bilateral) present. No deep or posterior cervical adenopathy.    Left cervical: Superficial cervical adenopathy present. No deep or posterior cervical adenopathy.     Lower Body: No right inguinal adenopathy. No left inguinal adenopathy.  Skin:    General: Skin is warm and dry.     Capillary Refill: Capillary refill takes less than 2 seconds.     Findings: No ecchymosis or erythema.  Neurological:     General: No focal  deficit present.     Mental Status: She is alert and oriented to person, place, and time.  Psychiatric:        Mood and Affect: Mood normal.        Behavior: Behavior normal.        Thought Content: Thought content normal.      No results found for any visits on 08/30/19.     Assessment & Plan    1. Herpes labialis without complication  Meds ordered this encounter  Medications  . valACYclovir (VALTREX) 1000 MG tablet    Sig: Take 1 tablet (1,000 mg total) by mouth 2 (two) times daily. For five to ten  Days. Start at first sign of symptoms.    Dispense:  20 tablet    Refill:  1     Discussed medication above and how to use.  Reviewed After visit summary with patient.  Discussed Red Flags and when to return to office or seek emergency medical treatment.   Advised patient call the office or your primary care doctor for an appointment if no improvement within 72 hours or if any symptoms change or worsen at any time  Advised ER or urgent Care if after hours or on weekend. Call 911 for emergency symptoms at any time.Patinet verbalized understanding of all instructions given/reviewed and treatment plan and has no further questions or concerns at this time.        The entirety of the information documented in the History of Present Illness, Review of Systems and Physical Exam were personally obtained by me. Portions of this information were initially documented by the  Certified Medical Assistant whose name is documented in [place] and reviewed by me for thoroughness and accuracy.  I have personally performed the exam and reviewed the chart and it is accurate to the best of my knowledge.  Haematologist has been used and any errors in dictation or transcription  are unintentional.  Kelby Aline. [place], [facility] Fritzi [place] as  a scribe for [employer] Jaeshawn Silvio, FNP.,have documented all relevant documentation on the behalf of Marcille Buffy, FNP,as directed by  Marcille Buffy, FNP while in the presence of Marcille Buffy, [place].

## 2019-08-30 NOTE — Patient Instructions (Signed)
Valacyclovir caplets What is this medicine? VALACYCLOVIR (val ay SYE kloe veer) is an antiviral medicine. It is used to treat or prevent infections caused by certain kinds of viruses. Examples of these infections include herpes and shingles. This medicine will not cure herpes. This medicine may be used for other purposes; ask your health care provider or pharmacist if you have questions. COMMON BRAND NAME(S): Valtrex What should I tell my health care provider before I take this medicine? They need to know if you have any of these conditions:  acquired immunodeficiency syndrome (AIDS)  any other condition that may weaken the immune system  bone marrow or kidney transplant  kidney disease  an unusual or allergic reaction to valacyclovir, acyclovir, ganciclovir, valganciclovir, other medicines, foods, dyes, or preservatives  pregnant or trying to get pregnant  breast-feeding How should I use this medicine? Take this medicine by mouth with a glass of water. Follow the directions on the prescription label. You can take this medicine with or without food. Take your doses at regular intervals. Do not take your medicine more often than directed. Finish the full course prescribed by your doctor or health care professional even if you think your condition is better. Do not stop taking except on the advice of your doctor or health care professional. Talk to your pediatrician regarding the use of this medicine in children. While this drug may be prescribed for children as young as 2 years for selected conditions, precautions do apply. Overdosage: If you think you have taken too much of this medicine contact a poison control center or emergency room at once. NOTE: This medicine is only for you. Do not share this medicine with others. What if I miss a dose? If you miss a dose, take it as soon as you can. If it is almost time for your next dose, take only that dose. Do not take double or extra doses.  What may interact with this medicine? Do not take this medicine with any of the following medications:  cidofovir This medicine may also interact with the following medications:  adefovir  amphotericin B  certain antibiotics like amikacin, gentamicin, tobramycin, vancomycin  cimetidine  cisplatin  colistin  cyclosporine  foscarnet  lithium  methotrexate  probenecid  tacrolimus This list may not describe all possible interactions. Give your health care provider a list of all the medicines, herbs, non-prescription drugs, or dietary supplements you use. Also tell them if you smoke, drink alcohol, or use illegal drugs. Some items may interact with your medicine. What should I watch for while using this medicine? Tell your doctor or health care professional if your symptoms do not start to get better after 1 week. This medicine works best when taken early in the course of an infection, within the first 20 hours. Begin treatment as soon as possible after the first signs of infection like tingling, itching, or pain in the affected area. It is possible that genital herpes may still be spread even when you are not having symptoms. Always use safer sex practices like condoms made of latex or polyurethane whenever you have sexual contact. You should stay well hydrated while taking this medicine. Drink plenty of fluids. What side effects may I notice from receiving this medicine? Side effects that you should report to your doctor or health care professional as soon as possible:  allergic reactions like skin rash, itching or hives, swelling of the face, lips, or tongue  aggressive behavior  confusion  hallucinations  problems  with balance, talking, walking  stomach pain  tremor  trouble passing urine or change in the amount of urine Side effects that usually do not require medical attention (report to your doctor or health care professional if they continue or are  bothersome):  dizziness  headache  nausea, vomiting This list may not describe all possible side effects. Call your doctor for medical advice about side effects. You may report side effects to FDA at 1-800-FDA-1088. Where should I keep my medicine? Keep out of the reach of children. Store at room temperature between 15 and 25 degrees C (59 and 77 degrees F). Keep container tightly closed. Throw away any unused medicine after the expiration date. NOTE: This sheet is a summary. It may not cover all possible information. If you have questions about this medicine, talk to your doctor, pharmacist, or health care provider.  2020 Elsevier/Gold Standard (2018-10-16 12:22:33) Cold Sore  A cold sore, also called a fever blister, is a small, fluid-filled sore that forms inside of the mouth or on the lips, gums, nose, chin, or cheeks. Cold sores can spread to other parts of the body, such as the eyes or fingers. Cold sores can spread from person to person (are contagious) until the sores crust over completely. Most cold sores go away within 2 weeks. What are the causes? Cold sores are caused by a virus (herpes simplex virus type 1, HSV-1). The virus can spread from person to person through close contact, such as through:  Kissing.  Touching the affected area.  Sharing personal items such as lip balm, razors, a drinking glass, or eating utensils. What increases the risk? You are more likely to develop this condition if you:  Are tired, stressed, or sick.  Are having your period (menstruating).  Are pregnant.  Take certain medicines.  Are out in cold weather or get too much sun. What are the signs or symptoms? Symptoms of a cold sore outbreak go through different stages. These are the stages of a cold sore:  Tingling, itching, or burning is felt 1-2 days before the outbreak.  Fluid-filled blisters appear on the lips, inside the mouth, on the nose, or on the cheeks.  The blisters start  to ooze clear fluid.  The blisters dry up, and a yellow crust appears in their place.  The crust falls off. In some cases, other symptoms can develop during a cold sore outbreak. These can include:  Fever.  Sore throat.  Headache.  Muscle aches.  Swollen neck glands. How is this treated? There is no cure for cold sores or the virus that causes them. There is also no vaccine to prevent the virus. Most cold sores go away on their own without treatment within 2 weeks. Your doctor may prescribe medicines to:  Help with pain.  Keep the virus from growing.  Help you heal faster. Medicines may be in the form of creams, gels, pills, or a shot. Follow these instructions at home: Medicines  Take or apply over-the-counter and prescription medicines only as told by your doctor.  Use a cotton-tip swab to apply creams or gels to your sores.  Ask your doctor if you can take lysine supplements. These may help with healing. Sore care   Do not touch the sores or pick the scabs.  Wash your hands often. Do not touch your eyes without washing your hands first.  Keep the sores clean and dry.  If told, put ice on the sores: ? Put ice in a  plastic bag. ? Place a towel between your skin and the bag. ? Leave the ice on for 20 minutes, 2-3 times a day. Eating and drinking  Eat a soft, bland diet. Avoid eating hot, cold, or salty foods. These can hurt your mouth.  Use a straw if it hurts to drink out of a glass.  Eat foods that have a lot of lysine in them. These include meat, fish, and dairy products.  Avoid sugary foods, chocolates, nuts, and grains. These foods have a high amount of a substance (arginine) that can cause the virus to grow. Lifestyle  Do not kiss, have oral sex, or share personal items until your sores heal.  Stress, poor sleep, and being out in the sun can trigger outbreaks. Make sure you: ? Do activities that help you relax, such as deep breathing exercises or  meditation. ? Get enough sleep. ? Apply sunscreen on your lips before you go out in the sun. Contact a doctor if:  You have symptoms for more than 2 weeks.  You have pus coming from the sores.  You have redness that is spreading.  You have pain or irritation in your eye.  You get sores on your genitals.  Your sores do not heal within 2 weeks.  You get cold sores often. Get help right away if:  You have a fever and your symptoms suddenly get worse.  You have a headache and confusion.  You have tiredness (fatigue).  You do not want to eat as much as normal (loss of appetite).  You have a stiff neck or are sensitive to light. Summary  A cold sore is a small, fluid-filled sore that forms inside of the mouth or on the lips, gums, nose, chin, or cheeks.  Cold sores can spread from person to person (are contagious) until the sores crust over completely. Most cold sores go away within 2 weeks.  Wash your hands often. Do not touch your eyes without washing your hands first.  Do not kiss, have oral sex, or share personal items until your sores heal.  Contact a doctor if your sores do not heal within 2 weeks. This information is not intended to replace advice given to you by your health care provider. Make sure you discuss any questions you have with your health care provider. Document Released: 03/20/2012 Document Revised: 01/09/2019 Document Reviewed: 02/19/2018 Elsevier Patient Education  2020 [employer].

## 2019-09-17 ENCOUNTER — Encounter: Payer: Self-pay | Admitting: Physician Assistant

## 2019-09-17 ENCOUNTER — Other Ambulatory Visit: Payer: Self-pay

## 2019-09-17 ENCOUNTER — Ambulatory Visit (INDEPENDENT_AMBULATORY_CARE_PROVIDER_SITE_OTHER): Payer: PPO | Admitting: Physician Assistant

## 2019-09-17 VITALS — BP 127/80 | HR 69 | Temp 96.2°F | Ht 61.5 in | Wt 115.0 lb

## 2019-09-17 DIAGNOSIS — Z0000 Encounter for general adult medical examination without abnormal findings: Secondary | ICD-10-CM | POA: Diagnosis not present

## 2019-09-17 DIAGNOSIS — G4700 Insomnia, unspecified: Secondary | ICD-10-CM

## 2019-09-17 DIAGNOSIS — Z780 Asymptomatic menopausal state: Secondary | ICD-10-CM

## 2019-09-17 DIAGNOSIS — Z1231 Encounter for screening mammogram for malignant neoplasm of breast: Secondary | ICD-10-CM | POA: Diagnosis not present

## 2019-09-17 DIAGNOSIS — G43009 Migraine without aura, not intractable, without status migrainosus: Secondary | ICD-10-CM | POA: Diagnosis not present

## 2019-09-17 DIAGNOSIS — F419 Anxiety disorder, unspecified: Secondary | ICD-10-CM

## 2019-09-17 NOTE — Progress Notes (Signed)
Patient: Brooke Mcdowell, Female    DOB: 1951/05/22, 68 y.o.   MRN: [id number] Visit Date: 09/17/2019  Today's Provider: Trinna Post, PA-C   Chief Complaint  Patient presents with  . Annual Exam   Subjective:     [name] is a 68 y.o. female. She feels well. She reports exercising some. She reports she is sleeping well.  Pt states she is sleeping well with Ambien.  She is due for mammogram 10/2019. 2020 mammogram normal. DEXA due similar time and last year showed osteopenia. Patient would like to order this next year.   Insomnia: Currently taking ambien 10 mg QHS nightly without issue. This helps her sleep and she denies over sedation or grogginess in the morning.   Depression and anxiety: Currently taking zoloft 50 mg daily.   HRT: Patient is postmenopausal. She has a uterus. She continues with hormone replacement therapy. She reports intolerable vasomotor symptoms when discontinuing these medications.    -----------------------------------------------------------   Review of Systems  Constitutional: Negative.   HENT: Positive for mouth sores. Negative for congestion, dental problem, drooling, ear discharge, ear pain, facial swelling, hearing loss, nosebleeds, postnasal drip, rhinorrhea, sinus pressure, sinus pain, sneezing, sore throat, tinnitus and trouble swallowing.   Eyes: Negative.   Respiratory: Negative.   Cardiovascular: Negative.   Gastrointestinal: Negative.   Endocrine: Negative.   Genitourinary: Negative.   Musculoskeletal: Positive for back pain. Negative for arthralgias, gait problem, joint swelling, myalgias, neck pain and neck stiffness.  Skin: Negative.   Allergic/Immunologic: Negative.   Neurological: Positive for headaches. Negative for dizziness, tremors, seizures, syncope, facial asymmetry, speech difficulty, weakness, light-headedness and numbness.  Hematological: Negative.   Psychiatric/Behavioral: Negative.      Social History   Socioeconomic History  . Marital status: Divorced    Spouse name: Not on file  . Number of children: 2  . Years of education: Not on file  . Highest education level: Some college, no degree  Occupational History  . Occupation: retired  Tobacco Use  . Smoking status: Never Smoker  . Smokeless tobacco: Never Used  Substance and Sexual Activity  . Alcohol use: Yes    Comment: rarely - 1/2 drinks @ a time  . Drug use: No  . Sexual activity: Not on file  Other Topics Concern  . Not on file  Social History Narrative  . Not on file   Social Determinants of Health   Financial Resource Strain:   . Difficulty of Paying Living Expenses: Not on file  Food Insecurity:   . Worried About Charity fundraiser in the Last Year: Not on file  . Ran Out of Food in the Last Year: Not on file  Transportation Needs:   . Lack of Transportation (Medical): Not on file  . Lack of Transportation (Non-Medical): Not on file  Physical Activity: Inactive  . Days of Exercise per Week: 0 days  . Minutes of Exercise per Session: 0 min  Stress:   . Feeling of Stress : Not on file  Social Connections: Unknown  . Frequency of Communication with Friends and Family: Patient refused  . Frequency of Social Gatherings with Friends and Family: Patient refused  . Attends Religious Services: Patient refused  . Active Member of Clubs or Organizations: Patient refused  . Attends Archivist Meetings: Patient refused  . Marital Status: Patient refused  Intimate Partner Violence: Unknown  . Fear of Current or Ex-Partner: Patient refused  .  Emotionally Abused: Patient refused  . Physically Abused: Patient refused  . Sexually Abused: Patient refused    Past Medical History:  Diagnosis Date  . Arthropathy of hand   . Dysthymic disorder   . Esophageal reflux   . History of kidney stones   . Insomnia   . Lumbar herniated disc   . Migraine   . Panic disorder   . Pure  hypercholesterolemia   . Vitamin D deficiency      Patient Active Problem List   Diagnosis Date Noted  . Anxiety 08/05/2016  . Restless legs 08/05/2016  . Insomnia 08/04/2016  . Esophageal reflux   . Vitamin D deficiency   . Pure hypercholesterolemia   . Migraine   . Dysthymic disorder   . Arthropathy of hand   . Panic disorder     Past Surgical History:  Procedure Laterality Date  . CATARACT EXTRACTION W/PHACO Right 12/06/2018   Procedure: CATARACT EXTRACTION PHACO AND INTRAOCULAR LENS PLACEMENT (IOC)-RIGHT;  Surgeon: Marchia Meiers, MD;  Location: [facility] ORS;  Service: Ophthalmology;  Laterality: Right;  [place] 01:28.9 CDE 11.98 Fluid Pack Lot # [id number]  . clot removal from right sinus after surgery to remove tumor in 1995    . LITHOTRIPSY  [phone]   renal stones  . TUBAL LIGATION    . TUMOR REMOVAL     behind right ete in sinuses- 1995 Dr. Pryor Ochoa    Her family history includes Brain cancer in her father and mother; Cancer in her father and mother.   Current Outpatient Medications:  .  albuterol (VENTOLIN HFA) 108 (90 Base) MCG/ACT inhaler, Inhale 1-2 puffs into the lungs every 4 (four) hours as needed. , Disp: , Rfl:  .  ASPIRIN-ACETAMINOPHEN PO, Take by mouth., Disp: , Rfl:  .  EMGALITY 120 MG/ML SOAJ, , Disp: , Rfl:  .  EQ GENTLE LUBRICANT 0.3 % SOLN, Place 1 drop into both eyes at bedtime as needed (dry/irritated eyes.)., Disp: , Rfl:  .  fluticasone (FLONASE) 50 MCG/ACT nasal spray, Place 2 sprays into both nostrils daily., Disp: 16 g, Rfl: 6 .  Magnesium 250 MG TABS, Take 500 mg by mouth every evening. , Disp: , Rfl:  .  medroxyPROGESTERone (PROVERA) 2.5 MG tablet, TAKE 1 TABLET(2.5 MG) BY MOUTH DAILY (Patient taking differently: Take 2.5 mg by mouth every evening. ), Disp: 90 tablet, Rfl: 0 .  montelukast (SINGULAIR) 10 MG tablet, Take 10 mg by mouth at bedtime. , Disp: , Rfl:  .  Multiple Vitamins-Minerals (ADULT GUMMY PO), Take 2 tablets by mouth every evening.  Vitafusion MultiVites Gummy, Disp: , Rfl:  .  omeprazole (PRILOSEC) 40 MG capsule, TAKE ONE CAPSULE BY MOUTH DAILY, Disp: 90 capsule, Rfl: 0 .  rizatriptan (MAXALT) 10 MG tablet, Take 1 tablet for migraine. May repeat in 2 hours if needed. Maximum 30 mg daily. (Patient taking differently: Take 5 mg by mouth every 2 (two) hours as needed for migraine (Take 1 tablet for migraine. May repeat in 2 hours if needed. Maximum 30 mg daily.). ), Disp: 10 tablet, Rfl: 3 .  sertraline (ZOLOFT) 50 MG tablet, Take 1 tablet (50 mg total) by mouth at bedtime., Disp: 90 tablet, Rfl: 1 .  valACYclovir (VALTREX) 1000 MG tablet, Take 1 tablet (1,000 mg total) by mouth 2 (two) times daily. For five to ten  Days. Start at first sign of symptoms., Disp: 20 tablet, Rfl: 1 .  zolpidem (AMBIEN) 10 MG tablet, TAKE 1 TABLET(10 MG) BY MOUTH AT  BEDTIME AS NEEDED FOR SLEEP, Disp: 30 tablet, Rfl: 1 .  cyclobenzaprine (FLEXERIL) 10 MG tablet, , Disp: , Rfl:  .  estradiol (ESTRACE) 1 MG tablet, TAKE 1 TABLET BY MOUTH DAILY (Patient taking differently: Take 1 mg by mouth every evening. ), Disp: 90 tablet, Rfl: 0 .  UBRELVY 100 MG TABS, TK 1 T PO ONCE PRF HA. REPEAT IN 2 H PRN FOR UP TO 1 DOSE MDD 3 TS IN 24 H., Disp: , Rfl:   Patient Care Team: Paulene Floor as PCP - General (Physician Assistant) Sharlet Salina, MD as Referring Physician (Physical Medicine and Rehabilitation) Clyde Canterbury, MD as Referring Physician (Otolaryngology) Marchia Meiers, MD as Consulting Physician (Ophthalmology) Erby Pian, MD as Referring Physician (Specialist) Vladimir Crofts, MD as Consulting Physician (Neurology)     Objective:    Vitals: BP 127/80 (BP Location: Right Arm, Patient Position: Sitting, Cuff Size: Normal)   Pulse 69   Temp (!) 96.2 F (35.7 C) (Temporal)   Ht 5' 1.5" (1.562 m)   Wt 115 lb (52.2 kg)   BMI 21.38 kg/m   Physical Exam Constitutional:      Appearance: Normal appearance.  Cardiovascular:      Rate and Rhythm: Normal rate and regular rhythm.     Heart sounds: Normal heart sounds.  Pulmonary:     Effort: Pulmonary effort is normal.     Breath sounds: Normal breath sounds.  Abdominal:     General: Bowel sounds are normal.     Palpations: Abdomen is soft.  Skin:    General: Skin is warm and dry.  Neurological:     Mental Status: She is alert and oriented to person, place, and time. Mental status is at baseline.  Psychiatric:        Mood and Affect: Mood normal.        Behavior: Behavior normal.     Activities of Daily Living In your present state of health, do you have any difficulty performing the following activities: 06/17/2019  Hearing? N  Vision? N  Comment Wears eye glasses.  Difficulty concentrating or making decisions? N  Walking or climbing stairs? N  Dressing or bathing? N  Doing errands, shopping? N  Preparing Food and eating ? N  Using the Toilet? N  In the past six months, have you accidently leaked urine? N  Do you have problems with loss of bowel control? N  Managing your Medications? N  Managing your Finances? N  Housekeeping or managing your Housekeeping? N  Some recent data might be hidden    Fall Risk Assessment Fall Risk  06/17/2019 09/13/2018 06/15/2018 06/13/2017  Falls in the past year? 0 0 No No  Number falls in past yr: - 0 - -  Injury with Fall? - 0 - -     Depression Screen PHQ 2/9 Scores 06/17/2019 09/13/2018 06/15/2018 09/22/2017  PHQ - 2 Score 0 0 0 0  PHQ- 9 Score - 2 - 4    6CIT Screen 09/17/2019  What Year? 0 points  What month? 0 points  What time? 0 points  Count back from 20 0 points  Months in reverse 2 points  Repeat phrase 4 points  Total Score 6       Assessment & Plan:    Annual Physical Reviewed patient's Family Medical History Reviewed and updated list of patient's medical providers Assessment of cognitive impairment was done Assessed patient's functional ability Established a written schedule for health  screening services Health Risk Assessent Completed and Reviewed  Exercise Activities and Dietary recommendations Goals    . Increase water intake     Recommend increasing water intake to 4-6 glasses a day.        Immunization History  Administered Date(s) Administered  . Influenza, High Dose Seasonal PF 07/20/2017, 06/07/2019  . Influenza-Unspecified 06/29/2018  . Pneumococcal Conjugate-13 06/13/2017  . Pneumococcal Polysaccharide-23 07/03/2018  . Tdap 03/17/2017  . Zoster 04/26/2013    Health Maintenance  Topic Date Due  . MAMMOGRAM  10/08/2020  . COLONOSCOPY  12/19/2020  . DEXA SCAN  10/30/2022  . TETANUS/TDAP  03/18/2027  . INFLUENZA VACCINE  Completed  . Hepatitis C Screening  Completed  . PNA vac Low Risk Adult  Completed     Discussed health benefits of physical activity, and encouraged her to engage in regular exercise appropriate for her age and condition.    1. Annual physical exam  Mammogram placed for screening and patient instructed on self scheduling.   2. Anxiety  Continue zoloft 50 mg daily.   3. Insomnia, unspecified type  Continue Ativan nightly.   4. Migraine without aura and without status migrainosus, not intractable  Followed by neurology.   5. Post-menopausal  Continue hormone replacement therapy. Patient has been counseled on non hormonal options and risk of continuing hormone therapy including heart attack, blood clot, stroke. Patient understands and wishes to continue.   F/u 1 year CPE and follow up  ------------------------------------------------------------------------------------------------------------    Trinna Post, PA-C  [facility]

## 2019-09-17 NOTE — Patient Instructions (Signed)
Health Maintenance, Female Adopting a healthy lifestyle and getting preventive care are important in promoting health and wellness. Ask your health care provider about:  The right schedule for you to have regular tests and exams.  Things you can do on your own to prevent diseases and keep yourself healthy. What should I know about diet, weight, and exercise? Eat a healthy diet   Eat a diet that includes plenty of vegetables, fruits, low-fat dairy products, and lean protein.  Do not eat a lot of foods that are high in solid fats, added sugars, or sodium. Maintain a healthy weight Body mass index (BMI) is used to identify weight problems. It estimates body fat based on height and weight. Your health care provider can help determine your BMI and help you achieve or maintain a healthy weight. Get regular exercise Get regular exercise. This is one of the most important things you can do for your health. Most adults should:  Exercise for at least 150 minutes each week. The exercise should increase your heart rate and make you sweat (moderate-intensity exercise).  Do strengthening exercises at least twice a week. This is in addition to the moderate-intensity exercise.  Spend less time sitting. Even light physical activity can be beneficial. Watch cholesterol and blood lipids Have your blood tested for lipids and cholesterol at 69 years of age, then have this test every 5 years. Have your cholesterol levels checked more often if:  Your lipid or cholesterol levels are high.  You are older than 69 years of age.  You are at high risk for heart disease. What should I know about cancer screening? Depending on your health history and family history, you may need to have cancer screening at various ages. This may include screening for:  Breast cancer.  Cervical cancer.  Colorectal cancer.  Skin cancer.  Lung cancer. What should I know about heart disease, diabetes, and high blood  pressure? Blood pressure and heart disease  High blood pressure causes heart disease and increases the risk of stroke. This is more likely to develop in people who have high blood pressure readings, are of African descent, or are overweight.  Have your blood pressure checked: ? Every 3-5 years if you are 18-39 years of age. ? Every year if you are 40 years old or older. Diabetes Have regular diabetes screenings. This checks your fasting blood sugar level. Have the screening done:  Once every three years after age 40 if you are at a normal weight and have a low risk for diabetes.  More often and at a younger age if you are overweight or have a high risk for diabetes. What should I know about preventing infection? Hepatitis B If you have a higher risk for hepatitis B, you should be screened for this virus. Talk with your health care provider to find out if you are at risk for hepatitis B infection. Hepatitis C Testing is recommended for:  Everyone born from 1945 through 1965.  Anyone with known risk factors for hepatitis C. Sexually transmitted infections (STIs)  Get screened for STIs, including gonorrhea and chlamydia, if: ? You are sexually active and are younger than 69 years of age. ? You are older than 69 years of age and your health care provider tells you that you are at risk for this type of infection. ? Your sexual activity has changed since you were last screened, and you are at increased risk for chlamydia or gonorrhea. Ask your health care provider if   you are at risk.  Ask your health care provider about whether you are at high risk for HIV. Your health care provider may recommend a prescription medicine to help prevent HIV infection. If you choose to take medicine to prevent HIV, you should first get tested for HIV. You should then be tested every 3 months for as long as you are taking the medicine. Pregnancy  If you are about to stop having your period (premenopausal) and  you may become pregnant, seek counseling before you get pregnant.  Take 400 to 800 micrograms (mcg) of folic acid every day if you become pregnant.  Ask for birth control (contraception) if you want to prevent pregnancy. Osteoporosis and menopause Osteoporosis is a disease in which the bones lose minerals and strength with aging. This can result in bone fractures. If you are 65 years old or older, or if you are at risk for osteoporosis and fractures, ask your health care provider if you should:  Be screened for bone loss.  Take a calcium or vitamin D supplement to lower your risk of fractures.  Be given hormone replacement therapy (HRT) to treat symptoms of menopause. Follow these instructions at home: Lifestyle  Do not use any products that contain nicotine or tobacco, such as cigarettes, e-cigarettes, and chewing tobacco. If you need help quitting, ask your health care provider.  Do not use street drugs.  Do not share needles.  Ask your health care provider for help if you need support or information about quitting drugs. Alcohol use  Do not drink alcohol if: ? Your health care provider tells you not to drink. ? You are pregnant, may be pregnant, or are planning to become pregnant.  If you drink alcohol: ? Limit how much you use to 0-1 drink a day. ? Limit intake if you are breastfeeding.  Be aware of how much alcohol is in your drink. In the [place] one drink equals one 12 oz bottle of beer (355 mL), one 5 oz glass of wine (148 mL), or one 1 oz glass of hard liquor (44 mL). General instructions  Schedule regular health, dental, and eye exams.  Stay current with your vaccines.  Tell your health care provider if: ? You often feel depressed. ? You have ever been abused or do not feel safe at home. Summary  Adopting a healthy lifestyle and getting preventive care are important in promoting health and wellness.  Follow your health care provider's instructions about healthy  diet, exercising, and getting tested or screened for diseases.  Follow your health care provider's instructions on monitoring your cholesterol and blood pressure. This information is not intended to replace advice given to you by your health care provider. Make sure you discuss any questions you have with your health care provider. Document Released: 04/04/2011 Document Revised: 09/12/2018 Document Reviewed: 09/12/2018 Elsevier Patient Education  2020 Elsevier Inc.  

## 2019-09-18 LAB — CBC WITH DIFFERENTIAL/PLATELET
Basophils Absolute: 0 10*3/uL (ref 0.0–0.2)
Basos: 1 %
EOS (ABSOLUTE): 0 10*3/uL (ref 0.0–0.4)
Eos: 1 %
Hematocrit: 41.5 % (ref 34.0–46.6)
Hemoglobin: 14.1 g/dL (ref 11.1–15.9)
Immature Grans (Abs): 0 10*3/uL (ref 0.0–0.1)
Immature Granulocytes: 0 %
Lymphocytes Absolute: 1.6 10*3/uL (ref 0.7–3.1)
Lymphs: 25 %
MCH: 31.3 pg (ref 26.6–33.0)
MCHC: 34 g/dL (ref 31.5–35.7)
MCV: 92 fL (ref 79–97)
Monocytes Absolute: 0.6 10*3/uL (ref 0.1–0.9)
Monocytes: 9 %
Neutrophils Absolute: 4 10*3/uL (ref 1.4–7.0)
Neutrophils: 64 %
Platelets: 269 10*3/uL (ref 150–450)
RBC: 4.51 x10E6/uL (ref 3.77–5.28)
RDW: 12.8 % (ref 11.7–15.4)
WBC: 6.2 10*3/uL (ref 3.4–10.8)

## 2019-09-18 LAB — COMPREHENSIVE METABOLIC PANEL
ALT: 15 IU/L (ref 0–32)
AST: 22 IU/L (ref 0–40)
Albumin/Globulin Ratio: 1.9 (ref 1.2–2.2)
Albumin: 4.3 g/dL (ref 3.8–4.8)
Alkaline Phosphatase: 65 IU/L (ref 39–117)
BUN/Creatinine Ratio: 17 (ref 12–28)
BUN: 13 mg/dL (ref 8–27)
Bilirubin Total: 0.3 mg/dL (ref 0.0–1.2)
CO2: 24 mmol/L (ref 20–29)
Calcium: 9.9 mg/dL (ref 8.7–10.3)
Chloride: 106 mmol/L (ref 96–106)
Creatinine, Ser: 0.75 mg/dL (ref 0.57–1.00)
GFR calc Af Amer: 94 mL/min/{1.73_m2} (ref >59)
GFR calc non Af Amer: 82 mL/min/{1.73_m2} (ref >59)
Globulin, Total: 2.3 g/dL (ref 1.5–4.5)
Glucose: 86 mg/dL (ref 65–99)
Potassium: 4.5 mmol/L (ref 3.5–5.2)
Sodium: 144 mmol/L (ref 134–144)
Total Protein: 6.6 g/dL (ref 6.0–8.5)

## 2019-09-18 LAB — LIPID PANEL
Chol/HDL Ratio: 4.3 ratio (ref 0.0–4.4)
Cholesterol, Total: 241 mg/dL — ABNORMAL HIGH (ref 100–199)
HDL: 56 mg/dL (ref >39)
LDL Chol Calc (NIH): 141 mg/dL — ABNORMAL HIGH (ref 0–99)
Triglycerides: 245 mg/dL — ABNORMAL HIGH (ref 0–149)
VLDL Cholesterol Cal: 44 mg/dL — ABNORMAL HIGH (ref 5–40)

## 2019-09-18 LAB — TSH: TSH: 1.78 u[IU]/mL (ref 0.450–4.500)

## 2019-09-29 ENCOUNTER — Encounter: Payer: Self-pay | Admitting: Physician Assistant

## 2019-09-29 DIAGNOSIS — E7800 Pure hypercholesterolemia, unspecified: Secondary | ICD-10-CM

## 2019-10-01 MED ORDER — ROSUVASTATIN CALCIUM 10 MG PO TABS: 10.0000 mg | ORAL_TABLET | Freq: Every day | ORAL | 1 refills | Status: DC

## 2019-10-01 NOTE — Telephone Encounter (Signed)
Can we schedule 2 mo followup for HLD? Can be telephone and come in separately for labs.

## 2019-10-10 DIAGNOSIS — M5416 Radiculopathy, lumbar region: Secondary | ICD-10-CM | POA: Diagnosis not present

## 2019-10-10 DIAGNOSIS — M48062 Spinal stenosis, lumbar region with neurogenic claudication: Secondary | ICD-10-CM | POA: Diagnosis not present

## 2019-10-10 DIAGNOSIS — M5136 Other intervertebral disc degeneration, lumbar region: Secondary | ICD-10-CM | POA: Diagnosis not present

## 2019-10-14 ENCOUNTER — Other Ambulatory Visit: Payer: Self-pay | Admitting: Physician Assistant

## 2019-10-14 DIAGNOSIS — G4700 Insomnia, unspecified: Secondary | ICD-10-CM

## 2019-10-16 ENCOUNTER — Ambulatory Visit
Admission: RE | Admit: 2019-10-16 | Discharge: 2019-10-16 | Disposition: A | Discharge status: Home / Self Care | Payer: PPO | Source: Ambulatory Visit | Attending: Physician Assistant | Admitting: Physician Assistant

## 2019-10-16 DIAGNOSIS — Z1231 Encounter for screening mammogram for malignant neoplasm of breast: Secondary | ICD-10-CM | POA: Diagnosis not present

## 2019-10-22 DIAGNOSIS — D2261 Melanocytic nevi of right upper limb, including shoulder: Secondary | ICD-10-CM | POA: Diagnosis not present

## 2019-10-22 DIAGNOSIS — X32XXXA Exposure to sunlight, initial encounter: Secondary | ICD-10-CM | POA: Diagnosis not present

## 2019-10-22 DIAGNOSIS — D2271 Melanocytic nevi of right lower limb, including hip: Secondary | ICD-10-CM | POA: Diagnosis not present

## 2019-10-22 DIAGNOSIS — D2262 Melanocytic nevi of left upper limb, including shoulder: Secondary | ICD-10-CM | POA: Diagnosis not present

## 2019-10-22 DIAGNOSIS — L821 Other seborrheic keratosis: Secondary | ICD-10-CM | POA: Diagnosis not present

## 2019-10-22 DIAGNOSIS — L570 Actinic keratosis: Secondary | ICD-10-CM | POA: Diagnosis not present

## 2019-10-22 DIAGNOSIS — Z85828 Personal history of other malignant neoplasm of skin: Secondary | ICD-10-CM | POA: Diagnosis not present

## 2019-11-06 ENCOUNTER — Telehealth: Payer: Self-pay

## 2019-11-06 NOTE — Telephone Encounter (Signed)
Copied from [place] [id number]. Topic: General - Other >> Nov 06, 2019  2:46 PM Antonieta Iba C wrote: Reason for CRM: pt called in for assistance. Pt says that she received a bill for DOS 08/29/20, stating that visit with provider Laverna Peace wasn't covered due to provider being out of network.   Pt would like further assistance with billing

## 2019-11-07 NOTE — Telephone Encounter (Signed)
Judson Roch - what do we need to do for this ?   Thanks, Laverna Peace MSN, AGNP-C, FNP-C

## 2019-11-07 NOTE — Telephone Encounter (Signed)
Patient states that she has [employer] and received a bill statement saying you were out of network. Patient states that statement shows that our office wasn't paid for visit? I asked patient if she had billing questions I could direct her, she said no but just wanted to let us know so were paid correctly? Im  guessing this was just a [employer]. KW

## 2019-11-18 ENCOUNTER — Other Ambulatory Visit: Payer: Self-pay | Admitting: Physician Assistant

## 2019-11-18 DIAGNOSIS — Z79890 Hormone replacement therapy (postmenopausal): Secondary | ICD-10-CM

## 2019-11-18 NOTE — Telephone Encounter (Signed)
Please review  refills for ESTRADIOL 1MG  TABLETS, and MEDROXYPROGESTERONE 2.5MG  TABLETS. They both expire on 11/19/19.

## 2019-12-02 ENCOUNTER — Ambulatory Visit (INDEPENDENT_AMBULATORY_CARE_PROVIDER_SITE_OTHER): Payer: PPO | Admitting: Physician Assistant

## 2019-12-02 ENCOUNTER — Encounter: Payer: Self-pay | Admitting: Physician Assistant

## 2019-12-02 ENCOUNTER — Other Ambulatory Visit: Payer: Self-pay

## 2019-12-02 VITALS — BP 114/68 | HR 74 | Temp 97.3°F | Wt 118.8 lb

## 2019-12-02 DIAGNOSIS — E7800 Pure hypercholesterolemia, unspecified: Secondary | ICD-10-CM | POA: Diagnosis not present

## 2019-12-02 NOTE — Progress Notes (Signed)
Patient: Brooke Mcdowell Female    DOB: October 01, 1951   69 y.o.   MRN: [id number] Visit Date: 12/02/2019  Today's Provider: Trinna Post, PA-C   Chief Complaint  Patient presents with  . Hyperlipidemia   Subjective:    I, Brooke Mcdowell,CMA am acting as a scribe for [employer].  HPI  Lipid/Cholesterol, Follow-up:   Last seen for this 2 months ago.  Management changes since that visit include starting Crestor 10 mg. . Last Lipid Panel:    Component Value Date/Time   CHOL 241 (H) 09/17/2019 1429   TRIG 245 (H) 09/17/2019 1429   HDL 56 09/17/2019 1429   CHOLHDL 4.3 09/17/2019 1429   LDLCALC 141 (H) 09/17/2019 1429    Risk factors for vascular disease include hypercholesterolemia  She reports good compliance with treatment. She is not having side effects.  Current symptoms include none and have been unchanged. Weight trend: stable Prior visit with dietician: no Current diet: well balanced Current exercise: no regular exercise  Wt Readings from Last 3 Encounters:  12/02/19 118 lb 12.8 oz (53.9 kg)  09/17/19 115 lb (52.2 kg)  08/30/19 118 lb 3.2 oz (53.6 kg)   Has had first COVID vaccine at [facility] 11/14/2019 - Moderna shot.  -------------------------------------------------------------------   Allergies  Allergen Reactions  . Gabapentin Other (See Comments)    Caused migraines  . Latex Itching and Other (See Comments)    redness  . Tetracyclines & Related Itching and Nausea And Vomiting  . Sulfa Antibiotics Rash     Current Outpatient Medications:  .  albuterol (VENTOLIN HFA) 108 (90 Base) MCG/ACT inhaler, Inhale 1-2 puffs into the lungs every 4 (four) hours as needed. , Disp: , Rfl:  .  ASPIRIN-ACETAMINOPHEN PO, Take by mouth., Disp: , Rfl:  .  cyclobenzaprine (FLEXERIL) 10 MG tablet, , Disp: , Rfl:  .  EMGALITY 120 MG/ML SOAJ, , Disp: , Rfl:  .  EQ GENTLE LUBRICANT 0.3 % SOLN, Place 1 drop into both eyes at bedtime  as needed (dry/irritated eyes.)., Disp: , Rfl:  .  estradiol (ESTRACE) 1 MG tablet, TAKE 1 TABLET BY MOUTH DAILY, Disp: 90 tablet, Rfl: 0 .  fluticasone (FLONASE) 50 MCG/ACT nasal spray, Place 2 sprays into both nostrils daily., Disp: 16 g, Rfl: 6 .  Magnesium 250 MG TABS, Take 500 mg by mouth every evening. , Disp: , Rfl:  .  medroxyPROGESTERone (PROVERA) 2.5 MG tablet, TAKE 1 TABLET(2.5 MG) BY MOUTH DAILY, Disp: 90 tablet, Rfl: 0 .  montelukast (SINGULAIR) 10 MG tablet, Take 10 mg by mouth at bedtime. , Disp: , Rfl:  .  Multiple Vitamins-Minerals (ADULT GUMMY PO), Take 2 tablets by mouth every evening. Vitafusion MultiVites Gummy, Disp: , Rfl:  .  omeprazole (PRILOSEC) 40 MG capsule, TAKE ONE CAPSULE BY MOUTH DAILY, Disp: 90 capsule, Rfl: 0 .  rizatriptan (MAXALT) 10 MG tablet, Take 1 tablet for migraine. May repeat in 2 hours if needed. Maximum 30 mg daily. (Patient taking differently: Take 5 mg by mouth every 2 (two) hours as needed for migraine (Take 1 tablet for migraine. May repeat in 2 hours if needed. Maximum 30 mg daily.). ), Disp: 10 tablet, Rfl: 3 .  rosuvastatin (CRESTOR) 10 MG tablet, Take 1 tablet (10 mg total) by mouth daily., Disp: 90 tablet, Rfl: 1 .  sertraline (ZOLOFT) 50 MG tablet, Take 1 tablet (50 mg total) by mouth at bedtime., Disp: 90 tablet, Rfl:  1 .  UBRELVY 100 MG TABS, TK 1 T PO ONCE PRF HA. REPEAT IN 2 H PRN FOR UP TO 1 DOSE MDD 3 TS IN 24 H., Disp: , Rfl:  .  valACYclovir (VALTREX) 1000 MG tablet, Take 1 tablet (1,000 mg total) by mouth 2 (two) times daily. For five to ten  Days. Start at first sign of symptoms., Disp: 20 tablet, Rfl: 1 .  zolpidem (AMBIEN) 10 MG tablet, TAKE 1 TABLET(10 MG) BY MOUTH AT BEDTIME AS NEEDED FOR SLEEP, Disp: 30 tablet, Rfl: 1  Review of Systems  Gastrointestinal: Positive for constipation.  Skin: Negative for rash.  Neurological: Negative for light-headedness and headaches.    Social History   Tobacco Use  . Smoking status: Never  Smoker  . Smokeless tobacco: Never Used  Substance Use Topics  . Alcohol use: Yes    Comment: rarely - 1/2 drinks @ a time      Objective:   BP 114/68 (BP Location: Left Arm, Patient Position: Sitting, Cuff Size: Normal)   Pulse 74   Temp (!) 97.3 F (36.3 C) (Temporal)   Wt 118 lb 12.8 oz (53.9 kg)   SpO2 99%   BMI 22.08 kg/m  Vitals:   12/02/19 1558  BP: 114/68  Pulse: 74  Temp: (!) 97.3 F (36.3 C)  TempSrc: Temporal  SpO2: 99%  Weight: 118 lb 12.8 oz (53.9 kg)  Body mass index is 22.08 kg/m.   Physical Exam Constitutional:      Appearance: Normal appearance.  Cardiovascular:     Rate and Rhythm: Normal rate and regular rhythm.     Heart sounds: Normal heart sounds.  Pulmonary:     Effort: Pulmonary effort is normal.     Breath sounds: Normal breath sounds.  Skin:    General: Skin is warm and dry.  Neurological:     Mental Status: She is alert and oriented to person, place, and time. Mental status is at baseline.  Psychiatric:        Mood and Affect: Mood normal.        Behavior: Behavior normal.      No results found for any visits on 12/02/19.     Assessment & Plan    1. Pure hypercholesterolemia  She is doing well with crestor 10 mg QHS. Denies muscle cramps and is taking the medication nightly. Will return for fasting labs. Adjustment pending labwork. Next follow up at physical.   - Lipid panel  The entirety of the information documented in the History of Present Illness, Review of Systems and Physical Exam were personally obtained by me. Portions of this information were initially documented by [facility] and reviewed by me for thoroughness and accuracy.      Trinna Post, PA-C  [facility]

## 2019-12-02 NOTE — Patient Instructions (Signed)

## 2019-12-03 DIAGNOSIS — E7800 Pure hypercholesterolemia, unspecified: Secondary | ICD-10-CM | POA: Diagnosis not present

## 2019-12-04 LAB — LIPID PANEL
Chol/HDL Ratio: 2.7 ratio (ref 0.0–4.4)
Cholesterol, Total: 163 mg/dL (ref 100–199)
HDL: 61 mg/dL (ref >39)
LDL Chol Calc (NIH): 79 mg/dL (ref 0–99)
Triglycerides: 129 mg/dL (ref 0–149)
VLDL Cholesterol Cal: 23 mg/dL (ref 5–40)

## 2019-12-10 ENCOUNTER — Other Ambulatory Visit: Payer: Self-pay

## 2019-12-10 MED ORDER — OMEPRAZOLE 40 MG PO CPDR: 40.0000 mg | DELAYED_RELEASE_CAPSULE | Freq: Every day | ORAL | 0 refills | Status: DC

## 2019-12-11 ENCOUNTER — Other Ambulatory Visit: Payer: Self-pay | Admitting: Physician Assistant

## 2019-12-11 DIAGNOSIS — G4700 Insomnia, unspecified: Secondary | ICD-10-CM

## 2019-12-11 NOTE — Telephone Encounter (Signed)
Requested medication (s) are due for refill to  Yes  Requested medication (s) are on the active medication list: Yes  Last refill:  10/14/19  Future visit scheduled: Yes  Notes to clinic:  See request.    Requested Prescriptions  Pending Prescriptions Disp Refills   zolpidem (AMBIEN) 10 MG tablet [Pharmacy Med Name: ZOLPIDEM 10MG  TABLETS] 30 tablet     Sig: TAKE 1 TABLET(10 MG) BY MOUTH AT BEDTIME AS NEEDED FOR SLEEP      Not Delegated - Psychiatry:  Anxiolytics/Hypnotics Failed - 12/11/2019 11:09 AM      Failed - This refill cannot be delegated      Failed - Urine Drug Screen completed in last 360 days.      Passed - Valid encounter within last 6 months    Recent Outpatient Visits           1 week ago Pure hypercholesterolemia   [place], [place], [place]   2 months ago Migraine without aura and without status migrainosus, not intractable   [place], [place]   3 months ago Herpes labialis without complication   [facility] Fl[place], Kelby Aline, FNP   5 months ago Sore throat   [facility] Trinna Post, [place]   1 year ago Annual physical exam   [name] M, [place]

## 2020-01-09 ENCOUNTER — Ambulatory Visit (INDEPENDENT_AMBULATORY_CARE_PROVIDER_SITE_OTHER): Payer: PPO | Admitting: Physician Assistant

## 2020-01-09 ENCOUNTER — Ambulatory Visit: Payer: PPO | Attending: Internal Medicine

## 2020-01-09 VITALS — Temp 98.9°F

## 2020-01-09 DIAGNOSIS — Z20822 Contact with and (suspected) exposure to covid-19: Secondary | ICD-10-CM | POA: Diagnosis not present

## 2020-01-09 DIAGNOSIS — J029 Acute pharyngitis, unspecified: Secondary | ICD-10-CM

## 2020-01-09 DIAGNOSIS — R05 Cough: Secondary | ICD-10-CM | POA: Diagnosis not present

## 2020-01-09 DIAGNOSIS — R059 Cough, unspecified: Secondary | ICD-10-CM

## 2020-01-09 MED ORDER — PROMETHAZINE-DM 6.25-15 MG/5ML PO SYRP: 5.0000 mL | ORAL_SOLUTION | Freq: Four times a day (QID) | ORAL | 0 refills | Status: DC | PRN

## 2020-01-09 NOTE — Progress Notes (Signed)
Virtual Visit via Telephone Note  I connected with Brooke Mcdowell on 01/09/20 at 11:00 AM EDT by telephone and verified that I am speaking with the correct person using two identifiers.  Location: Patient: Home Provider: Office    I discussed the limitations, risks, security and privacy concerns of performing an evaluation and management service by telephone and the availability of in person appointments. I also discussed with the patient that there may be a patient responsible charge related to this service. The patient expressed understanding and agreed to proceed.   Patient: Brooke Mcdowell Female    DOB: 09-May-1951   69 y.o.   MRN: [id number] Visit Date: 01/09/2020  Today's Provider: Trinna Post, PA-C   Chief Complaint  Patient presents with  . Headache  . Sore Throat  . Cough   Subjective:   She reports that on Tuesday 01/07/2020 she woke up with a sore throat and cough. A cough is typical for her at this time but the sore throat is not. Yesterday she felt very poorly with low energy. She reports pressure behind her eyes and sore throat. She doesn't feel any PND. She denies muscle aches and joint. Highest temperature was 99.38F yesterday. Since then she has taken tylenol and this has gone down. She is not vomiting today. Yesterday she had diarrhea but had drank miralax the night before. Denies sick contacts. She was vaccinated for COVID with her second dose on 12/25/2019.   Headache  This is a new problem. The current episode started in the past 7 days. The problem occurs daily. The problem has been unchanged. The pain quality is similar to prior headaches. Associated symptoms include coughing, a fever and a sore throat. The symptoms are aggravated by coughing, bright light, noise and activity. She has tried NSAIDs and acetaminophen for the symptoms. The treatment provided mild relief. Her past medical history is significant for migraine headaches.  Sore Throat  This is a new  problem. The problem has been unchanged. Neither side of throat is experiencing more pain than the other. There has been no fever. Associated symptoms include coughing, diarrhea and headaches. She has tried NSAIDs and acetaminophen for the symptoms.  Cough This is a new problem. The problem has been unchanged. The cough is productive of sputum. Associated symptoms include a fever, headaches and a sore throat. Nothing aggravates the symptoms. She has tried nothing for the symptoms.    Allergies  Allergen Reactions  . Gabapentin Other (See Comments)    Caused migraines  . Latex Itching and Other (See Comments)    redness  . Tetracyclines & Related Itching and Nausea And Vomiting  . Sulfa Antibiotics Rash     Current Outpatient Medications:  .  EMGALITY 120 MG/ML SOAJ, , Disp: , Rfl:  .  EQ GENTLE LUBRICANT 0.3 % SOLN, Place 1 drop into both eyes at bedtime as needed (dry/irritated eyes.)., Disp: , Rfl:  .  estradiol (ESTRACE) 1 MG tablet, TAKE 1 TABLET BY MOUTH DAILY (Patient taking differently: As of 01/09/20 she is taking every other day.), Disp: 90 tablet, Rfl: 0 .  fluticasone (FLONASE) 50 MCG/ACT nasal spray, Place 2 sprays into both nostrils daily., Disp: 16 g, Rfl: 6 .  Magnesium 250 MG TABS, Take 500 mg by mouth every evening. , Disp: , Rfl:  .  medroxyPROGESTERone (PROVERA) 2.5 MG tablet, TAKE 1 TABLET(2.5 MG) BY MOUTH DAILY, Disp: 90 tablet, Rfl: 0 .  montelukast (SINGULAIR) 10 MG tablet,  Take 10 mg by mouth at bedtime. , Disp: , Rfl:  .  Multiple Vitamins-Minerals (ADULT GUMMY PO), Take 2 tablets by mouth every evening. Vitafusion MultiVites Gummy, Disp: , Rfl:  .  omeprazole (PRILOSEC) 40 MG capsule, Take 1 capsule (40 mg total) by mouth daily., Disp: 90 capsule, Rfl: 0 .  rizatriptan (MAXALT) 10 MG tablet, Take 1 tablet for migraine. May repeat in 2 hours if needed. Maximum 30 mg daily., Disp: 10 tablet, Rfl: 3 .  rosuvastatin (CRESTOR) 10 MG tablet, Take 1 tablet (10 mg total) by  mouth daily., Disp: 90 tablet, Rfl: 1 .  sertraline (ZOLOFT) 50 MG tablet, Take 1 tablet (50 mg total) by mouth at bedtime., Disp: 90 tablet, Rfl: 1 .  UBRELVY 100 MG TABS, TK 1 T PO ONCE PRF HA. REPEAT IN 2 H PRN FOR UP TO 1 DOSE MDD 3 TS IN 24 H., Disp: , Rfl:  .  valACYclovir (VALTREX) 1000 MG tablet, Take 1 tablet (1,000 mg total) by mouth 2 (two) times daily. For five to ten  Days. Start at first sign of symptoms., Disp: 20 tablet, Rfl: 1 .  zolpidem (AMBIEN) 10 MG tablet, TAKE 1 TABLET(10 MG) BY MOUTH AT BEDTIME AS NEEDED FOR SLEEP, Disp: 30 tablet, Rfl: 0 .  albuterol (VENTOLIN HFA) 108 (90 Base) MCG/ACT inhaler, Inhale 1-2 puffs into the lungs every 4 (four) hours as needed. , Disp: , Rfl:  .  ASPIRIN-ACETAMINOPHEN PO, Take by mouth., Disp: , Rfl:  .  cyclobenzaprine (FLEXERIL) 10 MG tablet, , Disp: , Rfl:   Review of Systems  Constitutional: Positive for fever.  HENT: Positive for sore throat.   Eyes: Negative.   Respiratory: Positive for cough.   Gastrointestinal: Positive for diarrhea.  Endocrine: Negative.   Genitourinary: Negative.   Musculoskeletal: Negative.   Skin: Negative.   Allergic/Immunologic: Negative.   Neurological: Positive for headaches.  Hematological: Negative.   Psychiatric/Behavioral: Negative.     Social History   Tobacco Use  . Smoking status: Never Smoker  . Smokeless tobacco: Never Used  Substance Use Topics  . Alcohol use: Yes    Comment: rarely - 1/2 drinks @ a time      Objective:   Temp 98.9 F (37.2 C) (Temporal)  Vitals:   01/09/20 1019  Temp: 98.9 F (37.2 C)  TempSrc: Temporal  There is no height or weight on file to calculate BMI.   Physical Exam   No results found for any visits on 01/09/20.     Assessment & Plan    1. Cough  Scheduled for COVID testing. Advised on isolation, return precautions. May consider abx after 10 days if no improvement.   - promethazine-dextromethorphan (PROMETHAZINE-DM) 6.25-15 MG/5ML  syrup; Take 5 mLs by mouth 4 (four) times daily as needed.  Dispense: 118 mL; Refill: 0  2. Sore throat  - promethazine-dextromethorphan (PROMETHAZINE-DM) 6.25-15 MG/5ML syrup; Take 5 mLs by mouth 4 (four) times daily as needed.  Dispense: 118 mL; Refill: 0     I discussed the assessment and treatment plan with the patient. The patient was provided an opportunity to ask questions and all were answered. The patient agreed with the plan and demonstrated an understanding of the instructions.   The patient was advised to call back or seek an in-person evaluation if the symptoms worsen or if the condition fails to improve as anticipated.  I provided 15 minutes of non-face-to-face time during this encounter.       Wendee Beavers  Paizley Ramella, [facility]

## 2020-01-10 ENCOUNTER — Telehealth: Payer: Self-pay

## 2020-01-10 LAB — NOVEL CORONAVIRUS, NAA: SARS-CoV-2, NAA: NOT DETECTED

## 2020-01-10 LAB — SARS-COV-2, NAA 2 DAY TAT

## 2020-01-10 NOTE — Telephone Encounter (Signed)
She has only been sick for four days, I would not expect her to feel better. She will need to call back after ten days of symptoms.

## 2020-01-10 NOTE — Telephone Encounter (Signed)
Copied from [place] [id number]. Topic: General - Other >> Jan 10, 2020  3:23 PM Rainey Pines A wrote: Patient was advised to callback and let Terrilee Croak know if she wasn't feeling any better since her visit yesterday that she will call in antibitotics. Patient would like precription for antiobtics for 10 days called in today before office closes. Please advise [facility] [id number] [address], [place]  Phone:  [phone] Fax:  [phone]

## 2020-01-13 ENCOUNTER — Other Ambulatory Visit: Payer: Self-pay | Admitting: Physician Assistant

## 2020-01-13 DIAGNOSIS — G4700 Insomnia, unspecified: Secondary | ICD-10-CM

## 2020-01-13 NOTE — Telephone Encounter (Signed)
Patient was advised. Patient states that her throat does feel better but she feels like she has a sinus infection and antibiotics normally is the only thing that helps her.Please advise

## 2020-01-13 NOTE — Telephone Encounter (Signed)
Called patient and no answer, left voice message for patient to return call. If patient calls back ok for PEC to advise patient of message.

## 2020-01-13 NOTE — Telephone Encounter (Signed)
Requested medications are due for refill today?  Yes  - Medication refill cannot be delegated.    Requested medications are on active medication list?  Yes  Last Refill:   12/11/2019   # 30 with no refills  Future visit scheduled?  No  Notes to Clinic:  This medication refill cannot be delegated.

## 2020-01-13 NOTE — Telephone Encounter (Signed)
She is in the viral window. As we discussed at her visit, this will take ten days to get over. If she is not feeling better on April 16th, I will send her in an antibiotic

## 2020-01-14 NOTE — Telephone Encounter (Signed)
Phone call to pt.  Advised of recommendation by PCP.  Pt. Stated she really feels like she needs an antibiotic.  Reported she has a constant dull headache with pressure behind the right eye and in head.  Stated she has drainage down the back of throat and throat is "white" with mucus drainage.  Stated her throat does feel better.  Reported no fever today; had low grade fever on Sat. and Sun.  Stated overall, she "feels bad".  Taking Tylenol/Advil combination.  Encouraged a lot of rest and hydration.  Pt. Verb. Understanding.  Will send update to PCP.

## 2020-02-14 ENCOUNTER — Other Ambulatory Visit: Payer: Self-pay | Admitting: Physician Assistant

## 2020-02-14 DIAGNOSIS — G4700 Insomnia, unspecified: Secondary | ICD-10-CM

## 2020-02-14 NOTE — Telephone Encounter (Signed)
Requested medication (s) are due for refill today: yes  Requested medication (s) are on the active medication list: yes  Last refill:  01/14/2020  Future visit scheduled:yes  Notes to clinic:this refill cannot be delegated   Requested Prescriptions  Pending Prescriptions Disp Refills   zolpidem (AMBIEN) 10 MG tablet [Pharmacy Med Name: ZOLPIDEM 10MG  TABLETS] 30 tablet     Sig: TAKE 1 TABLET(10 MG) BY MOUTH AT BEDTIME AS NEEDED FOR SLEEP      Not Delegated - Psychiatry:  Anxiolytics/Hypnotics Failed - 02/14/2020 11:09 PM      Failed - This refill cannot be delegated      Failed - Urine Drug Screen completed in last 360 days.      Passed - Valid encounter within last 6 months    Recent Outpatient Visits           1 month ago Cough   [place], [place], [place]   2 months ago Pure hypercholesterolemia   [place], [place], [place]   5 months ago Migraine without aura and without status migrainosus, not intractable   [place], [place]   5 months ago Herpes labialis without complication   [facility] [place], Kelby Aline, FNP   7 months ago Sore throat   [facility] [name] M, [place]

## 2020-02-18 ENCOUNTER — Other Ambulatory Visit: Payer: Self-pay | Admitting: Physician Assistant

## 2020-02-18 DIAGNOSIS — E7800 Pure hypercholesterolemia, unspecified: Secondary | ICD-10-CM

## 2020-02-18 DIAGNOSIS — F419 Anxiety disorder, unspecified: Secondary | ICD-10-CM

## 2020-02-18 NOTE — Telephone Encounter (Signed)
Requested Prescriptions  Pending Prescriptions Disp Refills  . sertraline (ZOLOFT) 50 MG tablet [Pharmacy Med Name: SERTRALINE 50MG  TABLETS] 90 tablet 1    Sig: TAKE 1 TABLET(50 MG) BY MOUTH AT BEDTIME     Psychiatry:  Antidepressants - SSRI Passed - 02/18/2020 10:57 AM      Passed - Completed PHQ-2 or PHQ-9 in the last 360 days.      Passed - Valid encounter within last 6 months    Recent Outpatient Visits          1 month ago Cough   [place], [place], [place]   2 months ago Pure hypercholesterolemia   [place], [place], PA-C   5 months ago Migraine without aura and without status migrainosus, not intractable   [place], [place], [place]   5 months ago Herpes labialis without complication   [facility] [place], Kelby Aline, FNP   7 months ago Sore throat   [facility] Terrilee Croak, Adriana M, [place]             . rosuvastatin (CRESTOR) 10 MG tablet [Pharmacy Med Name: ROSUVASTATIN 10MG  TABLETS] 90 tablet 3    Sig: TAKE 1 TABLET(10 MG) BY MOUTH DAILY     Cardiovascular:  Antilipid - Statins Failed - 02/18/2020 10:57 AM      Failed - LDL in normal range and within 360 days    LDL Chol Calc (NIH)  Date Value Ref Range Status  12/03/2019 79 0 - 99 mg/dL Final         Passed - Total Cholesterol in normal range and within 360 days    Cholesterol, Total  Date Value Ref Range Status  12/03/2019 163 100 - 199 mg/dL Final         Passed - HDL in normal range and within 360 days    HDL  Date Value Ref Range Status  12/03/2019 61 >39 mg/dL Final         Passed - Triglycerides in normal range and within 360 days    Triglycerides  Date Value Ref Range Status  12/03/2019 129 0 - 149 mg/dL Final         Passed - Patient is not pregnant      Passed - Valid encounter within last 12 months    Recent Outpatient Visits          1 month ago Cough   [place], [place], PA-C   2 months ago Pure hypercholesterolemia   [place], [place], PA-C   5 months ago Migraine without aura and without status migrainosus, not intractable   [place], [place], PA-C   5 months ago Herpes labialis without complication   [place], Kelby Aline, FNP   7 months ago Sore throat   [facility] [place], Fabio Bering [place], [place]

## 2020-02-27 DIAGNOSIS — M48062 Spinal stenosis, lumbar region with neurogenic claudication: Secondary | ICD-10-CM | POA: Diagnosis not present

## 2020-02-27 DIAGNOSIS — M5416 Radiculopathy, lumbar region: Secondary | ICD-10-CM | POA: Diagnosis not present

## 2020-02-27 DIAGNOSIS — M5136 Other intervertebral disc degeneration, lumbar region: Secondary | ICD-10-CM | POA: Diagnosis not present

## 2020-03-16 ENCOUNTER — Other Ambulatory Visit: Payer: Self-pay | Admitting: Physician Assistant

## 2020-03-16 DIAGNOSIS — G4700 Insomnia, unspecified: Secondary | ICD-10-CM

## 2020-03-16 NOTE — Telephone Encounter (Signed)
Requested medications are due for refill today?  Yes - This medication refill cannot be delegated.    Requested medications are on active medication list?  Yes  Last Refill:   02/17/2020  # 30 with no refills   Future visit scheduled?  No   Notes to Clinic:  This medication refill cannot be delegated.

## 2020-03-17 NOTE — Telephone Encounter (Signed)
Refilled Ambien 10 mg one tablet po qhs PRN.   # 30 tablets. Trinna Post, PA-C is out of the office.

## 2020-03-17 NOTE — Telephone Encounter (Signed)
Called patient and left a detailed voicemail message.

## 2020-04-08 DIAGNOSIS — M5441 Lumbago with sciatica, right side: Secondary | ICD-10-CM | POA: Diagnosis not present

## 2020-04-08 DIAGNOSIS — M545 Low back pain: Secondary | ICD-10-CM | POA: Diagnosis not present

## 2020-04-08 DIAGNOSIS — R1013 Epigastric pain: Secondary | ICD-10-CM | POA: Diagnosis not present

## 2020-04-14 ENCOUNTER — Other Ambulatory Visit: Payer: Self-pay | Admitting: Adult Health

## 2020-04-14 DIAGNOSIS — G4700 Insomnia, unspecified: Secondary | ICD-10-CM

## 2020-04-14 NOTE — Telephone Encounter (Signed)
Requested medications are due for refill today?  Yes - this medication refill cannot be delegated.    Requested medications are on active medication list?  Yes  Last Refill:  03/17/2020  # 30 with no refills   Future visit scheduled?  No   Notes to Clinic:    This medication refill cannot be delegated.

## 2020-06-01 DIAGNOSIS — M65311 Trigger thumb, right thumb: Secondary | ICD-10-CM | POA: Diagnosis not present

## 2020-06-09 ENCOUNTER — Other Ambulatory Visit: Payer: Self-pay | Admitting: Physician Assistant

## 2020-06-09 DIAGNOSIS — G4700 Insomnia, unspecified: Secondary | ICD-10-CM

## 2020-06-09 NOTE — Telephone Encounter (Signed)
Requested medication (s) are due for refill today: yes  Requested medication (s) are on the active medication list:yes  Last refill:  04/14/20  #30  0 refills  Future visit scheduled: No  Notes to clinic:  Not delegated    Requested Prescriptions  Pending Prescriptions Disp Refills   zolpidem (AMBIEN) 10 MG tablet [Pharmacy Med Name: ZOLPIDEM 10MG  TABLETS] 30 tablet     Sig: TAKE 1 TABLET(10 MG) BY MOUTH AT BEDTIME AS NEEDED FOR SLEEP      Not Delegated - Psychiatry:  Anxiolytics/Hypnotics Failed - 06/09/2020 10:32 PM      Failed - This refill cannot be delegated      Failed - Urine Drug Screen completed in last 360 days.      Failed - Valid encounter within last 6 months    Recent Outpatient Visits           5 months ago Cough   [place], [place], [place]   6 months ago Pure hypercholesterolemia   [place], [place], [place]   8 months ago Migraine without aura and without status migrainosus, not intractable   [place], [place]   9 months ago Herpes labialis without complication   [facility] Flinch[place], Kelby Aline, FNP   11 months ago Sore throat   [facility] [name] M, [place]

## 2020-06-11 NOTE — Telephone Encounter (Signed)
Please review. Thanks!  

## 2020-06-16 NOTE — Progress Notes (Signed)
Subjective:   Brooke Mcdowell is a 69 y.o. female who presents for Medicare Annual (Subsequent) preventive examination.  I connected with Brooke Mcdowell today by telephone and verified that I am speaking with the correct person using two identifiers. Location patient: home Location provider: work Persons participating in the virtual visit: patient, provider.   I discussed the limitations, risks, security and privacy concerns of performing an evaluation and management service by telephone and the availability of in person appointments. I also discussed with the patient that there may be a patient responsible charge related to this service. The patient expressed understanding and verbally consented to this telephonic visit.    Interactive audio and video telecommunications were attempted between this provider and patient, however failed, due to patient having technical difficulties OR patient did not have access to video capability.  We continued and completed visit with audio only.   Review of Systems    N/A  Cardiac Risk Factors include: advanced age (>25men, >73 women);dyslipidemia     Objective:    There were no vitals filed for this visit. There is no height or weight on file to calculate BMI.  Advanced Directives 06/22/2020 06/17/2019 06/15/2018 06/13/2017  Does Patient Have a Medical Advance Directive? Yes Yes Yes Yes  Type of Paramedic of [place];Living will [place];Living will [place];Living will Living will  Copy of [place] in Chart? No - copy requested No - copy requested No - copy requested -    Current Medications (verified) Outpatient Encounter Medications as of 06/22/2020  Medication Sig  . albuterol (VENTOLIN HFA) 108 (90 Base) MCG/ACT inhaler Inhale 1-2 puffs into the lungs every 4 (four) hours as needed.   . ASPIRIN-ACETAMINOPHEN PO Take by mouth as needed.   . EQ GENTLE LUBRICANT  0.3 % SOLN Place 1 drop into both eyes at bedtime as needed (dry/irritated eyes.).  Marland Kitchen estradiol (ESTRACE) 1 MG tablet TAKE 1 TABLET BY MOUTH DAILY (Patient taking differently: As of 01/09/20 she is taking every other day.)  . Magnesium 250 MG TABS Take 500 mg by mouth every evening.   . medroxyPROGESTERone (PROVERA) 2.5 MG tablet TAKE 1 TABLET(2.5 MG) BY MOUTH DAILY  . Multiple Vitamins-Minerals (ADULT GUMMY PO) Take 2 tablets by mouth every evening. Vitafusion MultiVites Gummy  . omeprazole (PRILOSEC) 40 MG capsule Take 1 capsule (40 mg total) by mouth daily.  . rizatriptan (MAXALT) 10 MG tablet Take 1 tablet for migraine. May repeat in 2 hours if needed. Maximum 30 mg daily.  . rosuvastatin (CRESTOR) 10 MG tablet TAKE 1 TABLET(10 MG) BY MOUTH DAILY  . sertraline (ZOLOFT) 50 MG tablet TAKE 1 TABLET(50 MG) BY MOUTH AT BEDTIME  . zolpidem (AMBIEN) 10 MG tablet TAKE 1 TABLET(10 MG) BY MOUTH AT BEDTIME AS NEEDED FOR SLEEP  . cyclobenzaprine (FLEXERIL) 10 MG tablet  (Patient not taking: Reported on 06/22/2020)  . EMGALITY 120 MG/ML SOAJ  (Patient not taking: Reported on 06/22/2020)  . fluticasone (FLONASE) 50 MCG/ACT nasal spray Place 2 sprays into both nostrils daily. (Patient not taking: Reported on 06/22/2020)  . montelukast (SINGULAIR) 10 MG tablet Take 10 mg by mouth at bedtime.  (Patient not taking: Reported on 06/22/2020)  . promethazine-dextromethorphan (PROMETHAZINE-DM) 6.25-15 MG/5ML syrup Take 5 mLs by mouth 4 (four) times daily as needed. (Patient not taking: Reported on 06/22/2020)  . UBRELVY 100 MG TABS TK 1 T PO ONCE PRF HA. REPEAT IN 2 H PRN FOR  UP TO 1 DOSE MDD 3 TS IN 24 H. (Patient not taking: Reported on 06/22/2020)  . valACYclovir (VALTREX) 1000 MG tablet Take 1 tablet (1,000 mg total) by mouth 2 (two) times daily. For five to ten  Days. Start at first sign of symptoms. (Patient not taking: Reported on 06/22/2020)   No facility-administered encounter medications on file as of 06/22/2020.     Allergies (verified) Gabapentin, Latex, Tetracyclines & related, and Sulfa antibiotics   History: Past Medical History:  Diagnosis Date  . Arthropathy of hand   . Dysthymic disorder   . Esophageal reflux   . History of kidney stones   . Insomnia   . Lumbar herniated disc   . Migraine   . Panic disorder   . Pure hypercholesterolemia   . Vitamin D deficiency    Past Surgical History:  Procedure Laterality Date  . CATARACT EXTRACTION W/PHACO Right 12/06/2018   Procedure: CATARACT EXTRACTION PHACO AND INTRAOCULAR LENS PLACEMENT (IOC)-RIGHT;  Surgeon: Marchia Meiers, MD;  Location: [facility] ORS;  Service: Ophthalmology;  Laterality: Right;  [place] 01:28.9 CDE 11.98 Fluid Pack Lot # [id number]  . clot removal from right sinus after surgery to remove tumor in 1995    . LITHOTRIPSY  [phone]   renal stones  . TUBAL LIGATION    . TUMOR REMOVAL     behind right ete in sinuses- 1995 Dr. Pryor Ochoa   Family History  Problem Relation Age of Onset  . Brain cancer Mother   . Cancer Mother        lung  . Cancer Father        lung   . Brain cancer Father    Social History   Socioeconomic History  . Marital status: Divorced    Spouse name: Not on file  . Number of children: 2  . Years of education: Not on file  . Highest education level: Some college, no degree  Occupational History  . Occupation: retired  Tobacco Use  . Smoking status: Never Smoker  . Smokeless tobacco: Never Used  Vaping Use  . Vaping Use: Never used  Substance and Sexual Activity  . Alcohol use: Yes    Comment: rarely - 1 drinks   . Drug use: No  . Sexual activity: Not on file  Other Topics Concern  . Not on file  Social History Narrative  . Not on file   Social Determinants of Health   Financial Resource Strain: Low Risk   . Difficulty of Paying Living Expenses: Not hard at all  Food Insecurity: No Food Insecurity  . Worried About Charity fundraiser in the Last Year: Never true  . Ran Out of Food  in the Last Year: Never true  Transportation Needs: No Transportation Needs  . Lack of Transportation (Medical): No  . Lack of Transportation (Non-Medical): No  Physical Activity: Insufficiently Active  . Days of Exercise per Week: 3 days  . Minutes of Exercise per Session: 40 min  Stress: No Stress Concern Present  . Feeling of Stress : Not at all  Social Connections: Moderately Integrated  . Frequency of Communication with Friends and Family: More than three times a week  . Frequency of Social Gatherings with Friends and Family: More than three times a week  . Attends Religious Services: More than 4 times per year  . Active Member of Clubs or Organizations: No  . Attends Archivist Meetings: Never  . Marital Status: Living with partner  Tobacco Counseling Counseling given: Not Answered   Clinical Intake:  Pre-visit preparation completed: Yes  Pain : No/denies pain     Nutritional Risks: None Diabetes: No  How often do you need to have someone help you when you read instructions, pamphlets, or other written materials from your doctor or pharmacy?: 1 - Never  Diabetic? No  Interpreter Needed?: No  Information entered by :: [facility], LPN   Activities of Daily Living In your present state of health, do you have any difficulty performing the following activities: 06/22/2020 09/17/2019  Hearing? N N  Vision? N N  Difficulty concentrating or making decisions? N N  Walking or climbing stairs? N N  Dressing or bathing? N N  Doing errands, shopping? N N  Preparing Food and eating ? N -  Using the Toilet? N -  In the past six months, have you accidently leaked urine? N -  Do you have problems with loss of bowel control? N -  Managing your Medications? N -  Managing your Finances? N -  Housekeeping or managing your Housekeeping? N -  Some recent data might be hidden    Patient Care Team: Paulene Floor as PCP - General (Physician  Assistant) Sharlet Salina, MD as Referring Physician (Physical Medicine and Rehabilitation) Marchia Meiers, MD as Consulting Physician (Ophthalmology) Erby Pian, MD as Referring Physician (Specialist) Vladimir Crofts, MD as Consulting Physician (Neurology) Dasher, Rayvon Char, MD (Dermatology)  Indicate any recent Medical Services you may have received from other than [facility] providers in the past year (date may be approximate).     Assessment:   This is a routine wellness examination for Brooke Mcdowell.  Hearing/Vision screen No exam data present  Dietary issues and exercise activities discussed: Current Exercise Habits: Home exercise routine, Type of exercise: walking;treadmill, Time (Minutes): 45, Frequency (Times/Week): 5, Weekly Exercise (Minutes/Week): 225, Intensity: Mild, Exercise limited by: None identified  Goals    . Increase water intake     Recommend increasing water intake to 4-6 glasses a day.       Depression Screen PHQ 2/9 Scores 06/22/2020 09/17/2019 06/17/2019 09/13/2018 06/15/2018 09/22/2017 06/13/2017  PHQ - 2 Score 0 0 0 0 0 0 0  PHQ- 9 Score - 1 - 2 - 4 2    Fall Risk Fall Risk  06/22/2020 09/17/2019 06/17/2019 09/13/2018 06/15/2018  Falls in the past year? 0 0 0 0 No  Number falls in past yr: 0 0 - 0 -  Injury with Fall? 0 0 - 0 -  Follow up - Falls evaluation completed - - -    Any stairs in or around the home? Yes  If so, are there any without handrails? Yes  Home free of loose throw rugs in walkways, pet beds, electrical cords, etc? Yes  Adequate lighting in your home to reduce risk of falls? Yes   ASSISTIVE DEVICES UTILIZED TO PREVENT FALLS:  Life alert? No  Use of a cane, walker or w/c? No  Grab bars in the bathroom? Yes  Shower chair or bench in shower? Yes  Elevated toilet seat or a handicapped toilet? Yes    Cognitive Function: Declined today.     6CIT Screen 09/17/2019 06/13/2017  What Year? 0 points 0 points  What month? 0 points 0 points   What time? 0 points 0 points  Count back from 20 0 points 0 points  Months in reverse 2 points 0 points  Repeat phrase 4 points 4 points  Total Score 6 4    Immunizations Immunization History  Administered Date(s) Administered  . Influenza, High Dose Seasonal PF 07/20/2017, 06/07/2019  . Influenza-Unspecified 06/29/2018  . Moderna SARS-COVID-2 Vaccination 11/14/2019, 12/12/2019  . Pneumococcal Conjugate-13 06/13/2017  . Pneumococcal Polysaccharide-23 07/03/2018  . Tdap 03/17/2017  . Zoster 04/26/2013    TDAP status: Up to date Flu Vaccine status: Declined, Education has been provided regarding the importance of this vaccine but patient still declined. Advised may receive this vaccine at local pharmacy or Health Dept. Aware to provide a copy of the vaccination record if obtained from local pharmacy or Health Dept. Verbalized acceptance and understanding. Pneumococcal vaccine status: Up to date Covid-19 vaccine status: Completed vaccines  Qualifies for Shingles Vaccine? Yes   Zostavax completed Yes   Shingrix Completed?: No.    Education has been provided regarding the importance of this vaccine. Patient has been advised to call insurance company to determine out of pocket expense if they have not yet received this vaccine. Advised may also receive vaccine at local pharmacy or Health Dept. Verbalized acceptance and understanding.  Screening Tests Health Maintenance  Topic Date Due  . INFLUENZA VACCINE  05/03/2020  . COLONOSCOPY  12/19/2020  . MAMMOGRAM  10/15/2021  . DEXA SCAN  10/30/2022  . TETANUS/TDAP  03/18/2027  . COVID-19 Vaccine  Completed  . Hepatitis C Screening  Completed  . PNA vac Low Risk Adult  Completed    Health Maintenance  Health Maintenance Due  Topic Date Due  . INFLUENZA VACCINE  05/03/2020    Colorectal cancer screening: Completed 12/20/10. Repeat every 10 years Mammogram status: Completed 10/16/19. Repeat every year Bone Density status: Completed  10/30/17. Results reflect: Bone density results: OSTEOPENIA. Repeat every 5 years.  Lung Cancer Screening: (Low Dose CT Chest recommended if Age 52-80 years, 30 pack-year currently smoking OR have quit w/in 15years.) does not qualify.   Additional Screening:  Hepatitis C Screening: Up to date  Vision Screening: Recommended annual ophthalmology exams for early detection of glaucoma and other disorders of the eye. Is the patient up to date with their annual eye exam?  Yes  Who is the provider or what is the name of the office in which the patient attends annual eye exams? Dr Neville Route If pt is not established with a provider, would they like to be referred to a provider to establish care? No .   Dental Screening: Recommended annual dental exams for proper oral hygiene  Community Resource Referral / Chronic Care Management: CRR required this visit?  No   CCM required this visit?  No      Plan:     I have personally reviewed and noted the following in the patient's chart:   . Medical and social history . Use of alcohol, tobacco or illicit drugs  . Current medications and supplements . Functional ability and status . Nutritional status . Physical activity . Advanced directives . List of other physicians . Hospitalizations, surgeries, and ER visits in previous 12 months . Vitals . Screenings to include cognitive, depression, and falls . Referrals and appointments  In addition, I have reviewed and discussed with patient certain preventive protocols, quality metrics, and best practice recommendations. A written personalized care plan for preventive services as well as general preventive health recommendations were provided to patient.     Demitria Hay [place], [place]   11/22/2540   Nurse Notes: Pt to receive her flu shot at [facility] in October.

## 2020-06-22 ENCOUNTER — Other Ambulatory Visit: Payer: Self-pay

## 2020-06-22 ENCOUNTER — Ambulatory Visit (INDEPENDENT_AMBULATORY_CARE_PROVIDER_SITE_OTHER): Payer: PPO

## 2020-06-22 DIAGNOSIS — Z0000 Encounter for general adult medical examination without abnormal findings: Secondary | ICD-10-CM | POA: Diagnosis not present

## 2020-06-22 NOTE — Patient Instructions (Signed)
Brooke Mcdowell , Thank you for taking time to come for your Medicare Wellness Visit. I appreciate your ongoing commitment to your health goals. Please review the following plan we discussed and let me know if I can assist you in the future.   Screening recommendations/referrals: Colonoscopy: Up to date, due 12/2020 Mammogram: Up to date, due 10/2020 Bone Density: Up to date, due 10/2022 Recommended yearly ophthalmology/optometry visit for glaucoma screening and checkup Recommended yearly dental visit for hygiene and checkup  Vaccinations: Influenza vaccine: Currently due Pneumococcal vaccine: Completed series Tdap vaccine: Up to date, due 03/2027 Shingles vaccine: Shingrix discussed. Please contact your pharmacy for coverage information.     Advanced directives: Please bring a copy of your POA (Power of Attorney) and/or Living Will to your next appointment.   Conditions/risks identified: Continue to increase water intake to 6-8 8 oz glasses a day.   Next appointment: 07/14/20 @ 2:00 PM with [name]. Declined scheduling an AWV for 2022 at this time.    Preventive Care 67 Years and Older, Female Preventive care refers to lifestyle choices and visits with your health care provider that can promote health and wellness. What does preventive care include?  A yearly physical exam. This is also called an annual well check.  Dental exams once or twice a year.  Routine eye exams. Ask your health care provider how often you should have your eyes checked.  Personal lifestyle choices, including:  Daily care of your teeth and gums.  Regular physical activity.  Eating a healthy diet.  Avoiding tobacco and drug use.  Limiting alcohol use.  Practicing safe sex.  Taking low-dose aspirin every day.  Taking vitamin and mineral supplements as recommended by your health care provider. What happens during an annual well check? The services and screenings done by your health care provider  during your annual well check will depend on your age, overall health, lifestyle risk factors, and family history of disease. Counseling  Your health care provider may ask you questions about your:  Alcohol use.  Tobacco use.  Drug use.  Emotional well-being.  Home and relationship well-being.  Sexual activity.  Eating habits.  History of falls.  Memory and ability to understand (cognition).  Work and work Statistician.  Reproductive health. Screening  You may have the following tests or measurements:  Height, weight, and BMI.  Blood pressure.  Lipid and cholesterol levels. These may be checked every 5 years, or more frequently if you are over 16 years old.  Skin check.  Lung cancer screening. You may have this screening every year starting at age 38 if you have a 30-pack-year history of smoking and currently smoke or have quit within the past 15 years.  Fecal occult blood test (FOBT) of the stool. You may have this test every year starting at age 79.  Flexible sigmoidoscopy or colonoscopy. You may have a sigmoidoscopy every 5 years or a colonoscopy every 10 years starting at age 71.  Hepatitis C blood test.  Hepatitis B blood test.  Sexually transmitted disease (STD) testing.  Diabetes screening. This is done by checking your blood sugar (glucose) after you have not eaten for a while (fasting). You may have this done every 1-3 years.  Bone density scan. This is done to screen for osteoporosis. You may have this done starting at age 45.  Mammogram. This may be done every 1-2 years. Talk to your health care provider about how often you should have regular mammograms. Talk with your  health care provider about your test results, treatment options, and if necessary, the need for more tests. Vaccines  Your health care provider may recommend certain vaccines, such as:  Influenza vaccine. This is recommended every year.  Tetanus, diphtheria, and acellular pertussis  (Tdap, Td) vaccine. You may need a Td booster every 10 years.  Zoster vaccine. You may need this after age 18.  Pneumococcal 13-valent conjugate (PCV13) vaccine. One dose is recommended after age 9.  Pneumococcal polysaccharide (PPSV23) vaccine. One dose is recommended after age 70. Talk to your health care provider about which screenings and vaccines you need and how often you need them. This information is not intended to replace advice given to you by your health care provider. Make sure you discuss any questions you have with your health care provider. Document Released: 10/16/2015 Document Revised: 06/08/2016 Document Reviewed: 07/21/2015 Elsevier Interactive Patient Education  2017 [employer] Prevention in the Home Falls can cause injuries. They can happen to people of all ages. There are many things you can do to make your home safe and to help prevent falls. What can I do on the outside of my home?  Regularly fix the edges of walkways and driveways and fix any cracks.  Remove anything that might make you trip as you walk through a door, such as a raised step or threshold.  Trim any bushes or trees on the path to your home.  Use bright outdoor lighting.  Clear any walking paths of anything that might make someone trip, such as rocks or tools.  Regularly check to see if handrails are loose or broken. Make sure that both sides of any steps have handrails.  Any raised decks and porches should have guardrails on the edges.  Have any leaves, snow, or ice cleared regularly.  Use sand or salt on walking paths during winter.  Clean up any spills in your garage right away. This includes oil or grease spills. What can I do in the bathroom?  Use night lights.  Install grab bars by the toilet and in the tub and shower. Do not use towel bars as grab bars.  Use non-skid mats or decals in the tub or shower.  If you need to sit down in the shower, use a plastic, non-slip  stool.  Keep the floor dry. Clean up any water that spills on the floor as soon as it happens.  Remove soap buildup in the tub or shower regularly.  Attach bath mats securely with double-sided non-slip rug tape.  Do not have throw rugs and other things on the floor that can make you trip. What can I do in the bedroom?  Use night lights.  Make sure that you have a light by your bed that is easy to reach.  Do not use any sheets or blankets that are too big for your bed. They should not hang down onto the floor.  Have a firm chair that has side arms. You can use this for support while you get dressed.  Do not have throw rugs and other things on the floor that can make you trip. What can I do in the kitchen?  Clean up any spills right away.  Avoid walking on wet floors.  Keep items that you use a lot in easy-to-reach places.  If you need to reach something above you, use a strong step stool that has a grab bar.  Keep electrical cords out of the way.  Do not use  floor polish or wax that makes floors slippery. If you must use wax, use non-skid floor wax.  Do not have throw rugs and other things on the floor that can make you trip. What can I do with my stairs?  Do not leave any items on the stairs.  Make sure that there are handrails on both sides of the stairs and use them. Fix handrails that are broken or loose. Make sure that handrails are as long as the stairways.  Check any carpeting to make sure that it is firmly attached to the stairs. Fix any carpet that is loose or worn.  Avoid having throw rugs at the top or bottom of the stairs. If you do have throw rugs, attach them to the floor with carpet tape.  Make sure that you have a light switch at the top of the stairs and the bottom of the stairs. If you do not have them, ask someone to add them for you. What else can I do to help prevent falls?  Wear shoes that:  Do not have high heels.  Have rubber bottoms.  Are  comfortable and fit you well.  Are closed at the toe. Do not wear sandals.  If you use a stepladder:  Make sure that it is fully opened. Do not climb a closed stepladder.  Make sure that both sides of the stepladder are locked into place.  Ask someone to hold it for you, if possible.  Clearly mark and make sure that you can see:  Any grab bars or handrails.  First and last steps.  Where the edge of each step is.  Use tools that help you move around (mobility aids) if they are needed. These include:  Canes.  Walkers.  Scooters.  Crutches.  Turn on the lights when you go into a dark area. Replace any light bulbs as soon as they burn out.  Set up your furniture so you have a clear path. Avoid moving your furniture around.  If any of your floors are uneven, fix them.  If there are any pets around you, be aware of where they are.  Review your medicines with your doctor. Some medicines can make you feel dizzy. This can increase your chance of falling. Ask your doctor what other things that you can do to help prevent falls. This information is not intended to replace advice given to you by your health care provider. Make sure you discuss any questions you have with your health care provider. Document Released: 07/16/2009 Document Revised: 02/25/2016 Document Reviewed: 10/24/2014 Elsevier Interactive Patient Education  2017 [employer].

## 2020-07-01 DIAGNOSIS — E7800 Pure hypercholesterolemia, unspecified: Secondary | ICD-10-CM | POA: Insufficient documentation

## 2020-07-01 DIAGNOSIS — F410 Panic disorder [episodic paroxysmal anxiety] without agoraphobia: Secondary | ICD-10-CM | POA: Insufficient documentation

## 2020-07-01 DIAGNOSIS — F341 Dysthymic disorder: Secondary | ICD-10-CM | POA: Insufficient documentation

## 2020-07-01 DIAGNOSIS — G43719 Chronic migraine without aura, intractable, without status migrainosus: Secondary | ICD-10-CM | POA: Diagnosis not present

## 2020-07-01 DIAGNOSIS — E559 Vitamin D deficiency, unspecified: Secondary | ICD-10-CM | POA: Insufficient documentation

## 2020-07-01 DIAGNOSIS — M5441 Lumbago with sciatica, right side: Secondary | ICD-10-CM | POA: Diagnosis not present

## 2020-07-01 DIAGNOSIS — K219 Gastro-esophageal reflux disease without esophagitis: Secondary | ICD-10-CM | POA: Insufficient documentation

## 2020-07-01 DIAGNOSIS — M5481 Occipital neuralgia: Secondary | ICD-10-CM | POA: Diagnosis not present

## 2020-07-13 ENCOUNTER — Other Ambulatory Visit: Payer: Self-pay | Admitting: Physician Assistant

## 2020-07-13 DIAGNOSIS — G4700 Insomnia, unspecified: Secondary | ICD-10-CM

## 2020-07-13 DIAGNOSIS — M5481 Occipital neuralgia: Secondary | ICD-10-CM | POA: Diagnosis not present

## 2020-07-13 DIAGNOSIS — G43719 Chronic migraine without aura, intractable, without status migrainosus: Secondary | ICD-10-CM | POA: Diagnosis not present

## 2020-07-13 NOTE — Progress Notes (Signed)
Complete physical exam   Patient: Brooke Mcdowell   DOB: 27-Dec-1950   69 y.o. Female  MRN: [id number] Visit Date: 07/14/2020  Today's healthcare provider: Trinna Post, PA-C   Chief Complaint  Patient presents with  . Annual Exam   Subjective    Brooke Mcdowell is a 69 y.o. female who presents today for a complete physical exam.  She reports consuming a general diet. Home exercise routine includes walking 1 hrs per day. She generally feels well. She reports sleeping well. She does not have additional problems to discuss today.     Hormone Replacement Therapy: post menopausal with intolerable vasomotor symptoms including hot flashes and night sweats. She retains all of her reproductive organs and currently is prescribe estradiol 1 mg daily and provera 2.5 mg daily. She reports she only takes half of these medications. She has been instructed on the risks of long term hormone replacement therapy and is accepting of these. She desires to continue with hormone replacement therapy. She had tried weaning herself off of these medications and intolerable vasomotor symptoms resumed.  Insomnia: Ambien 10 mg nightly. She has been on this many years at this dose. Helps with her insomnia. No hangover effect.  Depression and anxiety: taking 50 mg zoloft daily. Does well on this, reports her mood is well.   Migraines: She has been seeing neurology recently. She is using Maxalt PRN. Got occipital nerve block recently. Was tried on aimovig but this was cost prohibitive. Currently going through options for assistance programs right now.    Acid Reflux/Abdominal Pain: continues with omeprazole 40 mg daily.   Lipid/Cholesterol, Follow-up  Last lipid panel Other pertinent labs  Lab Results  Component Value Date   CHOL 163 12/03/2019   HDL 61 12/03/2019   LDLCALC 79 12/03/2019   TRIG 129 12/03/2019   CHOLHDL 2.7 12/03/2019   Lab Results  Component Value Date   ALT 15 09/17/2019    AST 22 09/17/2019   PLT 269 09/17/2019   TSH 1.780 09/17/2019     She was last seen for this 6 months ago.  Management since that visit includes continue crestor.  She reports excellent compliance with treatment. She is not having side effects.   Symptoms: No chest pain No chest pressure/discomfort  No dyspnea No lower extremity edema  No numbness or tingling of extremity No orthopnea  No palpitations No paroxysmal nocturnal dyspnea  No speech difficulty No syncope   Current diet: not asked Current exercise: aerobics  The ASCVD Risk score Mikey Bussing DC Jr., et al., 2013) failed to calculate for the following reasons:   Unable to determine if patient is Non-Hispanic African American  ---------------------------------------------------------------------------------------------------   Past Medical History:  Diagnosis Date  . Arthropathy of hand   . Dysthymic disorder   . Esophageal reflux   . History of kidney stones   . Insomnia   . Lumbar herniated disc   . Migraine   . Panic disorder   . Pure hypercholesterolemia   . Vitamin D deficiency    Past Surgical History:  Procedure Laterality Date  . CATARACT EXTRACTION W/PHACO Right 12/06/2018   Procedure: CATARACT EXTRACTION PHACO AND INTRAOCULAR LENS PLACEMENT (IOC)-RIGHT;  Surgeon: Marchia Meiers, MD;  Location: [facility] ORS;  Service: Ophthalmology;  Laterality: Right;  [place] 01:28.9 CDE 11.98 Fluid Pack Lot # [id number]  . clot removal from right sinus after surgery to remove tumor in 1995    . LITHOTRIPSY  [phone]  renal stones  . TUBAL LIGATION    . TUMOR REMOVAL     behind right ete in sinuses- 1995 Dr. Pryor Ochoa   Social History   Socioeconomic History  . Marital status: Divorced    Spouse name: Not on file  . Number of children: 2  . Years of education: Not on file  . Highest education level: Some college, no degree  Occupational History  . Occupation: retired  Tobacco Use  . Smoking status: Never Smoker  .  Smokeless tobacco: Never Used  Vaping Use  . Vaping Use: Never used  Substance and Sexual Activity  . Alcohol use: Yes    Comment: rarely - 1 drinks   . Drug use: No  . Sexual activity: Not on file  Other Topics Concern  . Not on file  Social History Narrative  . Not on file   Social Determinants of Health   Financial Resource Strain: Low Risk   . Difficulty of Paying Living Expenses: Not hard at all  Food Insecurity: No Food Insecurity  . Worried About Charity fundraiser in the Last Year: Never true  . Ran Out of Food in the Last Year: Never true  Transportation Needs: No Transportation Needs  . Lack of Transportation (Medical): No  . Lack of Transportation (Non-Medical): No  Physical Activity: Insufficiently Active  . Days of Exercise per Week: 3 days  . Minutes of Exercise per Session: 40 min  Stress: No Stress Concern Present  . Feeling of Stress : Not at all  Social Connections: Moderately Integrated  . Frequency of Communication with Friends and Family: More than three times a week  . Frequency of Social Gatherings with Friends and Family: More than three times a week  . Attends Religious Services: More than 4 times per year  . Active Member of Clubs or Organizations: No  . Attends Archivist Meetings: Never  . Marital Status: Living with partner  Intimate Partner Violence: Not At Risk  . Fear of Current or Ex-Partner: No  . Emotionally Abused: No  . Physically Abused: No  . Sexually Abused: No   Family Status  Relation Name Status  . Mother  Deceased  . Father  Deceased  . Sister  Alive  . Brother half Alive   Family History  Problem Relation Age of Onset  . Brain cancer Mother   . Cancer Mother        lung  . Cancer Father        lung   . Brain cancer Father    Allergies  Allergen Reactions  . Gabapentin Other (See Comments)    Caused migraines  . Latex Itching and Other (See Comments)    redness  . Tetracyclines & Related Itching and  Nausea And Vomiting  . Sulfa Antibiotics Rash    Patient Care Team: Paulene Floor as PCP - General (Physician Assistant) Sharlet Salina, MD as Referring Physician (Physical Medicine and Rehabilitation) Marchia Meiers, MD as Consulting Physician (Ophthalmology) Erby Pian, MD as Referring Physician (Specialist) Vladimir Crofts, MD as Consulting Physician (Neurology) Dasher, Rayvon Char, MD (Dermatology)   Medications: Outpatient Medications Prior to Visit  Medication Sig  . ASPIRIN-ACETAMINOPHEN PO Take by mouth as needed.   . cyclobenzaprine (FLEXERIL) 10 MG tablet   . albuterol (VENTOLIN HFA) 108 (90 Base) MCG/ACT inhaler Inhale 1-2 puffs into the lungs every 4 (four) hours as needed.   Marland Kitchen EMGALITY 120 MG/ML SOAJ  (Patient not  taking: Reported on 06/22/2020)  . EQ GENTLE LUBRICANT 0.3 % SOLN Place 1 drop into both eyes at bedtime as needed (dry/irritated eyes.).  Marland Kitchen estradiol (ESTRACE) 1 MG tablet TAKE 1 TABLET BY MOUTH DAILY (Patient taking differently: As of 01/09/20 she is taking every other day.)  . fluticasone (FLONASE) 50 MCG/ACT nasal spray Place 2 sprays into both nostrils daily. (Patient not taking: Reported on 06/22/2020)  . Magnesium 250 MG TABS Take 500 mg by mouth every evening.   . medroxyPROGESTERone (PROVERA) 2.5 MG tablet TAKE 1 TABLET(2.5 MG) BY MOUTH DAILY  . montelukast (SINGULAIR) 10 MG tablet Take 10 mg by mouth at bedtime.  (Patient not taking: Reported on 06/22/2020)  . Multiple Vitamins-Minerals (ADULT GUMMY PO) Take 2 tablets by mouth every evening. Vitafusion MultiVites Gummy  . omeprazole (PRILOSEC) 40 MG capsule Take 1 capsule (40 mg total) by mouth daily.  . promethazine-dextromethorphan (PROMETHAZINE-DM) 6.25-15 MG/5ML syrup Take 5 mLs by mouth 4 (four) times daily as needed. (Patient not taking: Reported on 06/22/2020)  . rizatriptan (MAXALT) 10 MG tablet Take 1 tablet for migraine. May repeat in 2 hours if needed. Maximum 30 mg daily.  .  rosuvastatin (CRESTOR) 10 MG tablet TAKE 1 TABLET(10 MG) BY MOUTH DAILY  . sertraline (ZOLOFT) 50 MG tablet TAKE 1 TABLET(50 MG) BY MOUTH AT BEDTIME  . UBRELVY 100 MG TABS TK 1 T PO ONCE PRF HA. REPEAT IN 2 H PRN FOR UP TO 1 DOSE MDD 3 TS IN 24 H. (Patient not taking: Reported on 06/22/2020)  . valACYclovir (VALTREX) 1000 MG tablet Take 1 tablet (1,000 mg total) by mouth 2 (two) times daily. For five to ten  Days. Start at first sign of symptoms. (Patient not taking: Reported on 06/22/2020)  . zolpidem (AMBIEN) 10 MG tablet TAKE 1 TABLET(10 MG) BY MOUTH AT BEDTIME AS NEEDED FOR SLEEP   No facility-administered medications prior to visit.    Review of Systems  Constitutional: Negative.   HENT: Negative.   Eyes: Negative.   Respiratory: Negative.   Cardiovascular: Negative.   Gastrointestinal: Negative.   Endocrine: Negative.   Genitourinary: Negative.   Musculoskeletal: Positive for arthralgias and back pain.  Skin: Negative.   Allergic/Immunologic: Negative.   Neurological: Positive for headaches.  Hematological: Negative.   Psychiatric/Behavioral: Negative.       Objective    BP 115/66   Pulse 67   Temp 98.6 F (37 C)   Resp 16   Ht 5\' 1"  (1.549 m)   Wt 116 lb (52.6 kg)   BMI 21.92 kg/m    Physical Exam Constitutional:      Appearance: Normal appearance.  HENT:     Right Ear: Tympanic membrane normal.     Left Ear: Tympanic membrane normal.  Eyes:     Pupils: Pupils are equal, round, and reactive to light.  Cardiovascular:     Rate and Rhythm: Normal rate and regular rhythm.     Pulses: Normal pulses.     Heart sounds: Normal heart sounds.  Pulmonary:     Effort: Pulmonary effort is normal.     Breath sounds: Normal breath sounds.  Abdominal:     General: Bowel sounds are normal.     Palpations: Abdomen is soft.  Musculoskeletal:        General: Normal range of motion.  Skin:    General: Skin is warm and dry.  Neurological:     Mental Status: She is alert  and oriented to person, place,  and time. Mental status is at baseline.  Psychiatric:        Mood and Affect: Mood normal.        Behavior: Behavior normal.       Last depression screening scores PHQ 2/9 Scores 07/14/2020 06/22/2020 09/17/2019  PHQ - 2 Score 0 0 0  PHQ- 9 Score 1 - 1   Last fall risk screening Fall Risk  06/22/2020  Falls in the past year? 0  Number falls in past yr: 0  Injury with Fall? 0  Follow up -   Last Audit-C alcohol use screening Alcohol Use Disorder Test (AUDIT) 06/22/2020  1. How often do you have a drink containing alcohol? 1  2. How many drinks containing alcohol do you have on a typical day when you are drinking? 0  3. How often do you have six or more drinks on one occasion? 0  AUDIT-C Score 1  Alcohol Brief Interventions/Follow-up AUDIT Score <7 follow-up not indicated   A score of 3 or more in women, and 4 or more in men indicates increased risk for alcohol abuse, EXCEPT if all of the points are from question 1   No results found for any visits on 07/14/20.  Assessment & Plan    Routine Health Maintenance and Physical Exam  Exercise Activities and Dietary recommendations Goals    . Increase water intake     Recommend increasing water intake to 4-6 glasses a day.        Immunization History  Administered Date(s) Administered  . Fluad Quad(high Dose 65+) 07/06/2020  . Influenza, High Dose Seasonal PF 07/20/2017, 06/07/2019  . Influenza-Unspecified 06/29/2018  . Moderna SARS-COVID-2 Vaccination 11/14/2019, 12/12/2019  . Pneumococcal Conjugate-13 06/13/2017  . Pneumococcal Polysaccharide-23 07/03/2018  . Tdap 03/17/2017  . Zoster 04/26/2013    Health Maintenance  Topic Date Due  . COLONOSCOPY  12/19/2020  . MAMMOGRAM  10/15/2021  . DEXA SCAN  10/30/2022  . TETANUS/TDAP  03/18/2027  . INFLUENZA VACCINE  Completed  . COVID-19 Vaccine  Completed  . Hepatitis C Screening  Completed  . PNA vac Low Risk Adult  Completed     Discussed health benefits of physical activity, and encouraged her to engage in regular exercise appropriate for her age and condition.  1. Annual physical exam  UTD on screenings, due for colonoscopy 12/2020 and mammogram 10/2020. Mammogram order placed, colonoscopy referral will be placed closer to the time.   - TSH - Lipid panel - Comprehensive metabolic panel - CBC with Differential/Platelet  2. Encounter for screening mammogram for malignant neoplasm of breast  - MM Digital Screening; Future  3. Insomnia, unspecified type  Continue Ambien QHS.   4. Migraine without aura and without status migrainosus, not intractable  Managed by neurology.  5. Hormone replacement therapy  Continue, counseled on risks/benefits.  6. Anxiety  Continue zoloft.   7. Gastroesophageal reflux disease, unspecified whether esophagitis present  Continue prilosec.   8. Pure hypercholesterolemia  Continue crestor.   F/u 1 year CPE and chronic     I, Trinna Post, PA-C, have reviewed all documentation for this visit. The documentation on 07/16/20 for the exam, diagnosis, procedures, and orders are all accurate and complete.  The entirety of the information documented in the History of Present Illness, Review of Systems and Physical Exam were personally obtained by me. Portions of this information were initially documented by Wilburt Finlay, CMA and reviewed by me for thoroughness and accuracy.     Fabio Bering  Janyce Llanos  [facility] [phone] (phone) [phone] (fax)  [place]

## 2020-07-13 NOTE — Telephone Encounter (Signed)
Requested medication (s) are due for refill today: Yes  Requested medication (s) are on the active medication list: Yes  Last refill:  06/11/20  Future visit scheduled: Yes  Notes to clinic: Unable to refill per protocol, cannot delegate     Requested Prescriptions  Pending Prescriptions Disp Refills   zolpidem (AMBIEN) 10 MG tablet [Pharmacy Med Name: ZOLPIDEM 10MG  TABLETS] 30 tablet     Sig: TAKE 1 TABLET(10 MG) BY MOUTH AT BEDTIME AS NEEDED FOR SLEEP      Not Delegated - Psychiatry:  Anxiolytics/Hypnotics Failed - 07/13/2020 10:41 PM      Failed - This refill cannot be delegated      Failed - Urine Drug Screen completed in last 360 days.      Failed - Valid encounter within last 6 months    Recent Outpatient Visits           6 months ago Cough   [place], [place], [place]   7 months ago Pure hypercholesterolemia   [place], [place], [place]   10 months ago Migraine without aura and without status migrainosus, not intractable   [place], [place]   10 months ago Herpes labialis without complication   [facility] Fl[place], Kelby Aline, FNP   1 year ago Sore throat   [facility] Trinna Post, [place]       Future Appointments             Tomorrow Trinna Post, [place], [facility]

## 2020-07-14 ENCOUNTER — Other Ambulatory Visit: Payer: Self-pay

## 2020-07-14 ENCOUNTER — Encounter: Payer: Self-pay | Admitting: Physician Assistant

## 2020-07-14 ENCOUNTER — Ambulatory Visit (INDEPENDENT_AMBULATORY_CARE_PROVIDER_SITE_OTHER): Payer: PPO | Admitting: Physician Assistant

## 2020-07-14 VITALS — BP 115/66 | HR 67 | Temp 98.6°F | Resp 16 | Ht 61.0 in | Wt 116.0 lb

## 2020-07-14 DIAGNOSIS — Z0000 Encounter for general adult medical examination without abnormal findings: Secondary | ICD-10-CM | POA: Diagnosis not present

## 2020-07-14 DIAGNOSIS — K219 Gastro-esophageal reflux disease without esophagitis: Secondary | ICD-10-CM

## 2020-07-14 DIAGNOSIS — G43009 Migraine without aura, not intractable, without status migrainosus: Secondary | ICD-10-CM | POA: Diagnosis not present

## 2020-07-14 DIAGNOSIS — E7800 Pure hypercholesterolemia, unspecified: Secondary | ICD-10-CM | POA: Diagnosis not present

## 2020-07-14 DIAGNOSIS — Z79890 Hormone replacement therapy (postmenopausal): Secondary | ICD-10-CM

## 2020-07-14 DIAGNOSIS — G4700 Insomnia, unspecified: Secondary | ICD-10-CM | POA: Diagnosis not present

## 2020-07-14 DIAGNOSIS — F419 Anxiety disorder, unspecified: Secondary | ICD-10-CM | POA: Diagnosis not present

## 2020-07-14 DIAGNOSIS — Z1231 Encounter for screening mammogram for malignant neoplasm of breast: Secondary | ICD-10-CM

## 2020-07-14 NOTE — Patient Instructions (Signed)
Health Maintenance, Female Adopting a healthy lifestyle and getting preventive care are important in promoting health and wellness. Ask your health care provider about:  The right schedule for you to have regular tests and exams.  Things you can do on your own to prevent diseases and keep yourself healthy. What should I know about diet, weight, and exercise? Eat a healthy diet   Eat a diet that includes plenty of vegetables, fruits, low-fat dairy products, and lean protein.  Do not eat a lot of foods that are high in solid fats, added sugars, or sodium. Maintain a healthy weight Body mass index (BMI) is used to identify weight problems. It estimates body fat based on height and weight. Your health care provider can help determine your BMI and help you achieve or maintain a healthy weight. Get regular exercise Get regular exercise. This is one of the most important things you can do for your health. Most adults should:  Exercise for at least 150 minutes each week. The exercise should increase your heart rate and make you sweat (moderate-intensity exercise).  Do strengthening exercises at least twice a week. This is in addition to the moderate-intensity exercise.  Spend less time sitting. Even light physical activity can be beneficial. Watch cholesterol and blood lipids Have your blood tested for lipids and cholesterol at 70 years of age, then have this test every 5 years. Have your cholesterol levels checked more often if:  Your lipid or cholesterol levels are high.  You are older than 70 years of age.  You are at high risk for heart disease. What should I know about cancer screening? Depending on your health history and family history, you may need to have cancer screening at various ages. This may include screening for:  Breast cancer.  Cervical cancer.  Colorectal cancer.  Skin cancer.  Lung cancer. What should I know about heart disease, diabetes, and high blood  pressure? Blood pressure and heart disease  High blood pressure causes heart disease and increases the risk of stroke. This is more likely to develop in people who have high blood pressure readings, are of African descent, or are overweight.  Have your blood pressure checked: ? Every 3-5 years if you are 18-39 years of age. ? Every year if you are 40 years old or older. Diabetes Have regular diabetes screenings. This checks your fasting blood sugar level. Have the screening done:  Once every three years after age 40 if you are at a normal weight and have a low risk for diabetes.  More often and at a younger age if you are overweight or have a high risk for diabetes. What should I know about preventing infection? Hepatitis B If you have a higher risk for hepatitis B, you should be screened for this virus. Talk with your health care provider to find out if you are at risk for hepatitis B infection. Hepatitis C Testing is recommended for:  Everyone born from 1945 through 1965.  Anyone with known risk factors for hepatitis C. Sexually transmitted infections (STIs)  Get screened for STIs, including gonorrhea and chlamydia, if: ? You are sexually active and are younger than 70 years of age. ? You are older than 70 years of age and your health care provider tells you that you are at risk for this type of infection. ? Your sexual activity has changed since you were last screened, and you are at increased risk for chlamydia or gonorrhea. Ask your health care provider if   you are at risk.  Ask your health care provider about whether you are at high risk for HIV. Your health care provider may recommend a prescription medicine to help prevent HIV infection. If you choose to take medicine to prevent HIV, you should first get tested for HIV. You should then be tested every 3 months for as long as you are taking the medicine. Pregnancy  If you are about to stop having your period (premenopausal) and  you may become pregnant, seek counseling before you get pregnant.  Take 400 to 800 micrograms (mcg) of folic acid every day if you become pregnant.  Ask for birth control (contraception) if you want to prevent pregnancy. Osteoporosis and menopause Osteoporosis is a disease in which the bones lose minerals and strength with aging. This can result in bone fractures. If you are 65 years old or older, or if you are at risk for osteoporosis and fractures, ask your health care provider if you should:  Be screened for bone loss.  Take a calcium or vitamin D supplement to lower your risk of fractures.  Be given hormone replacement therapy (HRT) to treat symptoms of menopause. Follow these instructions at home: Lifestyle  Do not use any products that contain nicotine or tobacco, such as cigarettes, e-cigarettes, and chewing tobacco. If you need help quitting, ask your health care provider.  Do not use street drugs.  Do not share needles.  Ask your health care provider for help if you need support or information about quitting drugs. Alcohol use  Do not drink alcohol if: ? Your health care provider tells you not to drink. ? You are pregnant, may be pregnant, or are planning to become pregnant.  If you drink alcohol: ? Limit how much you use to 0-1 drink a day. ? Limit intake if you are breastfeeding.  Be aware of how much alcohol is in your drink. In the [place] one drink equals one 12 oz bottle of beer (355 mL), one 5 oz glass of wine (148 mL), or one 1 oz glass of hard liquor (44 mL). General instructions  Schedule regular health, dental, and eye exams.  Stay current with your vaccines.  Tell your health care provider if: ? You often feel depressed. ? You have ever been abused or do not feel safe at home. Summary  Adopting a healthy lifestyle and getting preventive care are important in promoting health and wellness.  Follow your health care provider's instructions about healthy  diet, exercising, and getting tested or screened for diseases.  Follow your health care provider's instructions on monitoring your cholesterol and blood pressure. This information is not intended to replace advice given to you by your health care provider. Make sure you discuss any questions you have with your health care provider. Document Revised: 09/12/2018 Document Reviewed: 09/12/2018 Elsevier Patient Education  2020 Elsevier Inc.  

## 2020-07-15 NOTE — Telephone Encounter (Signed)
Please review. Thanks!  

## 2020-07-16 DIAGNOSIS — Z0000 Encounter for general adult medical examination without abnormal findings: Secondary | ICD-10-CM | POA: Diagnosis not present

## 2020-07-17 LAB — LIPID PANEL
Chol/HDL Ratio: 2.3 ratio (ref 0.0–4.4)
Cholesterol, Total: 160 mg/dL (ref 100–199)
HDL: 71 mg/dL (ref >39)
LDL Chol Calc (NIH): 75 mg/dL (ref 0–99)
Triglycerides: 73 mg/dL (ref 0–149)
VLDL Cholesterol Cal: 14 mg/dL (ref 5–40)

## 2020-07-17 LAB — COMPREHENSIVE METABOLIC PANEL
ALT: 22 IU/L (ref 0–32)
AST: 20 IU/L (ref 0–40)
Albumin/Globulin Ratio: 1.9 (ref 1.2–2.2)
Albumin: 4.4 g/dL (ref 3.8–4.8)
Alkaline Phosphatase: 59 IU/L (ref 44–121)
BUN/Creatinine Ratio: 21 (ref 12–28)
BUN: 16 mg/dL (ref 8–27)
Bilirubin Total: 0.4 mg/dL (ref 0.0–1.2)
CO2: 21 mmol/L (ref 20–29)
Calcium: 9.4 mg/dL (ref 8.7–10.3)
Chloride: 106 mmol/L (ref 96–106)
Creatinine, Ser: 0.76 mg/dL (ref 0.57–1.00)
GFR calc Af Amer: 92 mL/min/{1.73_m2} (ref >59)
GFR calc non Af Amer: 80 mL/min/{1.73_m2} (ref >59)
Globulin, Total: 2.3 g/dL (ref 1.5–4.5)
Glucose: 83 mg/dL (ref 65–99)
Potassium: 4.2 mmol/L (ref 3.5–5.2)
Sodium: 142 mmol/L (ref 134–144)
Total Protein: 6.7 g/dL (ref 6.0–8.5)

## 2020-07-17 LAB — CBC WITH DIFFERENTIAL/PLATELET
Basophils Absolute: 0 10*3/uL (ref 0.0–0.2)
Basos: 1 %
EOS (ABSOLUTE): 0 10*3/uL (ref 0.0–0.4)
Eos: 0 %
Hematocrit: 40.6 % (ref 34.0–46.6)
Hemoglobin: 13.8 g/dL (ref 11.1–15.9)
Immature Grans (Abs): 0 10*3/uL (ref 0.0–0.1)
Immature Granulocytes: 0 %
Lymphocytes Absolute: 1.7 10*3/uL (ref 0.7–3.1)
Lymphs: 26 %
MCH: 31.5 pg (ref 26.6–33.0)
MCHC: 34 g/dL (ref 31.5–35.7)
MCV: 93 fL (ref 79–97)
Monocytes Absolute: 0.5 10*3/uL (ref 0.1–0.9)
Monocytes: 7 %
Neutrophils Absolute: 4.4 10*3/uL (ref 1.4–7.0)
Neutrophils: 66 %
Platelets: 253 10*3/uL (ref 150–450)
RBC: 4.38 x10E6/uL (ref 3.77–5.28)
RDW: 11.9 % (ref 11.7–15.4)
WBC: 6.6 10*3/uL (ref 3.4–10.8)

## 2020-07-17 LAB — TSH: TSH: 3.36 u[IU]/mL (ref 0.450–4.500)

## 2020-08-20 DIAGNOSIS — H2512 Age-related nuclear cataract, left eye: Secondary | ICD-10-CM | POA: Diagnosis not present

## 2020-09-01 ENCOUNTER — Other Ambulatory Visit: Payer: Self-pay | Admitting: Physician Assistant

## 2020-09-01 ENCOUNTER — Other Ambulatory Visit: Payer: Self-pay | Admitting: Gastroenterology

## 2020-09-01 DIAGNOSIS — F419 Anxiety disorder, unspecified: Secondary | ICD-10-CM

## 2020-09-01 DIAGNOSIS — Z79890 Hormone replacement therapy (postmenopausal): Secondary | ICD-10-CM

## 2020-09-08 DIAGNOSIS — H2512 Age-related nuclear cataract, left eye: Secondary | ICD-10-CM | POA: Diagnosis not present

## 2020-09-08 DIAGNOSIS — E7800 Pure hypercholesterolemia, unspecified: Secondary | ICD-10-CM | POA: Diagnosis not present

## 2020-09-09 ENCOUNTER — Other Ambulatory Visit: Payer: Self-pay

## 2020-09-09 ENCOUNTER — Encounter: Payer: Self-pay | Admitting: Ophthalmology

## 2020-09-11 ENCOUNTER — Other Ambulatory Visit
Admission: RE | Admit: 2020-09-11 | Discharge: 2020-09-11 | Disposition: A | Discharge status: Home / Self Care | Payer: PPO | Source: Ambulatory Visit | Attending: Ophthalmology | Admitting: Ophthalmology

## 2020-09-11 ENCOUNTER — Other Ambulatory Visit: Payer: Self-pay

## 2020-09-11 DIAGNOSIS — Z01812 Encounter for preprocedural laboratory examination: Secondary | ICD-10-CM | POA: Diagnosis not present

## 2020-09-11 DIAGNOSIS — Z20822 Contact with and (suspected) exposure to covid-19: Secondary | ICD-10-CM | POA: Diagnosis not present

## 2020-09-11 NOTE — Discharge Instructions (Signed)

## 2020-09-12 LAB — SARS CORONAVIRUS 2 (TAT 6-24 HRS): SARS Coronavirus 2: NEGATIVE

## 2020-09-14 ENCOUNTER — Other Ambulatory Visit: Payer: Self-pay | Admitting: Physician Assistant

## 2020-09-14 DIAGNOSIS — G4700 Insomnia, unspecified: Secondary | ICD-10-CM

## 2020-09-14 NOTE — Telephone Encounter (Signed)
Requested medication (s) are due for refill today: yes  Requested medication (s) are on the active medication list: yes   Last refill:  08/17/2020  Future visit scheduled: yes   Notes to clinic: this refill cannot be delegated    Requested Prescriptions  Pending Prescriptions Disp Refills   zolpidem (AMBIEN) 10 MG tablet [Pharmacy Med Name: ZOLPIDEM 10MG  TABLETS] 30 tablet     Sig: TAKE 1 TABLET(10 MG) BY MOUTH AT BEDTIME AS NEEDED FOR SLEEP      Not Delegated - Psychiatry:  Anxiolytics/Hypnotics Failed - 09/14/2020 12:10 AM      Failed - This refill cannot be delegated      Failed - Urine Drug Screen completed in last 360 days      Passed - Valid encounter within last 6 months    Recent Outpatient Visits           2 months ago Annual physical exam   [place], [place], PA-C   8 months ago Cough   [place], [place], [place]   9 months ago Pure hypercholesterolemia   [place], [place], [place]   12 months ago Migraine without aura and without status migrainosus, not intractable   [place], [place]   1 year ago Herpes labialis without complication   [place], Kelby Aline, FNP       Future Appointments             In 10 months Trinna Post, [place], [facility]

## 2020-09-15 ENCOUNTER — Encounter
Admission: RE | Disposition: A | Discharge status: Home / Self Care | Payer: Self-pay | Source: Home / Self Care | Attending: Ophthalmology

## 2020-09-15 ENCOUNTER — Other Ambulatory Visit: Payer: Self-pay

## 2020-09-15 ENCOUNTER — Ambulatory Visit: Payer: PPO | Admitting: Anesthesiology

## 2020-09-15 ENCOUNTER — Encounter: Payer: Self-pay | Admitting: Ophthalmology

## 2020-09-15 ENCOUNTER — Ambulatory Visit
Admission: RE | Admit: 2020-09-15 | Discharge: 2020-09-15 | Disposition: A | Discharge status: Home / Self Care | Payer: PPO | Attending: Ophthalmology | Admitting: Ophthalmology

## 2020-09-15 DIAGNOSIS — Z9109 Other allergy status, other than to drugs and biological substances: Secondary | ICD-10-CM | POA: Diagnosis not present

## 2020-09-15 DIAGNOSIS — Z91040 Latex allergy status: Secondary | ICD-10-CM | POA: Diagnosis not present

## 2020-09-15 DIAGNOSIS — H2512 Age-related nuclear cataract, left eye: Secondary | ICD-10-CM | POA: Insufficient documentation

## 2020-09-15 DIAGNOSIS — H25812 Combined forms of age-related cataract, left eye: Secondary | ICD-10-CM | POA: Diagnosis not present

## 2020-09-15 DIAGNOSIS — Z882 Allergy status to sulfonamides status: Secondary | ICD-10-CM | POA: Diagnosis not present

## 2020-09-15 DIAGNOSIS — Z881 Allergy status to other antibiotic agents status: Secondary | ICD-10-CM | POA: Diagnosis not present

## 2020-09-15 DIAGNOSIS — Z79899 Other long term (current) drug therapy: Secondary | ICD-10-CM | POA: Diagnosis not present

## 2020-09-15 HISTORY — DX: Unspecified osteoarthritis, unspecified site: M19.90

## 2020-09-15 HISTORY — DX: Dizziness and giddiness: R42

## 2020-09-15 HISTORY — PX: CATARACT EXTRACTION W/PHACO: SHX586

## 2020-09-15 HISTORY — DX: Pain in left ankle and joints of left foot: M25.572

## 2020-09-15 HISTORY — DX: Age-related osteoporosis without current pathological fracture: M81.0

## 2020-09-15 SURGERY — PHACOEMULSIFICATION, CATARACT, WITH IOL INSERTION
Anesthesia: Monitor Anesthesia Care | Site: Eye | Laterality: Left

## 2020-09-15 MED ORDER — OXYCODONE HCL 5 MG/5ML PO SOLN: 5.0000 mg | Freq: Once | ORAL | Status: DC | PRN

## 2020-09-15 MED ORDER — ACETAMINOPHEN 325 MG PO TABS
650.0000 mg | ORAL_TABLET | Freq: Once | ORAL | Status: AC
  Administered 2020-09-15: 650 mg via ORAL

## 2020-09-15 MED ORDER — LIDOCAINE HCL (PF) 2 % IJ SOLN
INTRAOCULAR | Status: DC | PRN
  Administered 2020-09-15: 2 mL

## 2020-09-15 MED ORDER — LACTATED RINGERS IV SOLN: INTRAVENOUS | Status: DC

## 2020-09-15 MED ORDER — PROMETHAZINE HCL 25 MG/ML IJ SOLN: 6.2500 mg | INTRAMUSCULAR | Status: DC | PRN

## 2020-09-15 MED ORDER — OXYCODONE HCL 5 MG PO TABS: 5.0000 mg | ORAL_TABLET | Freq: Once | ORAL | Status: DC | PRN

## 2020-09-15 MED ORDER — FENTANYL CITRATE (PF) 100 MCG/2ML IJ SOLN: 25.0000 ug | INTRAMUSCULAR | Status: DC | PRN

## 2020-09-15 MED ORDER — FENTANYL CITRATE (PF) 100 MCG/2ML IJ SOLN
INTRAMUSCULAR | Status: DC | PRN
  Administered 2020-09-15: 50 ug via INTRAVENOUS

## 2020-09-15 MED ORDER — BRIMONIDINE TARTRATE-TIMOLOL 0.2-0.5 % OP SOLN
OPHTHALMIC | Status: DC | PRN
  Administered 2020-09-15: 1 [drp] via OPHTHALMIC

## 2020-09-15 MED ORDER — ARMC OPHTHALMIC DILATING DROPS
1.0000 "application " | OPHTHALMIC | Status: DC | PRN
  Administered 2020-09-15 (×3): 1 via OPHTHALMIC

## 2020-09-15 MED ORDER — MEPERIDINE HCL 25 MG/ML IJ SOLN: 6.2500 mg | INTRAMUSCULAR | Status: DC | PRN

## 2020-09-15 MED ORDER — TETRACAINE HCL 0.5 % OP SOLN
1.0000 [drp] | OPHTHALMIC | Status: DC | PRN
  Administered 2020-09-15 (×3): 1 [drp] via OPHTHALMIC

## 2020-09-15 MED ORDER — NA CHONDROIT SULF-NA HYALURON 40-17 MG/ML IO SOLN
INTRAOCULAR | Status: DC | PRN
  Administered 2020-09-15: 1 mL via INTRAOCULAR

## 2020-09-15 MED ORDER — MOXIFLOXACIN HCL 0.5 % OP SOLN
OPHTHALMIC | Status: DC | PRN
  Administered 2020-09-15: 0.2 mL via OPHTHALMIC

## 2020-09-15 MED ORDER — EPINEPHRINE PF 1 MG/ML IJ SOLN
INTRAOCULAR | Status: DC | PRN
  Administered 2020-09-15: 09:00:00 46 mL via OPHTHALMIC

## 2020-09-15 MED ORDER — MIDAZOLAM HCL 2 MG/2ML IJ SOLN
INTRAMUSCULAR | Status: DC | PRN
  Administered 2020-09-15 (×2): 1 mg via INTRAVENOUS

## 2020-09-15 SURGICAL SUPPLY — 19 items
CANNULA ANT/CHMB 27G (MISCELLANEOUS) ×2 IMPLANT
CANNULA ANT/CHMB 27GA (MISCELLANEOUS) ×4 IMPLANT
GLOVE SURG LX 8.0 MICRO (GLOVE) ×1
GLOVE SURG LX STRL 8.0 MICRO (GLOVE) ×1 IMPLANT
GLOVE SURG TRIUMPH 8.0 PF LTX (GLOVE) ×2 IMPLANT
GOWN STRL REUS W/ TWL LRG LVL3 (GOWN DISPOSABLE) ×2 IMPLANT
GOWN STRL REUS W/TWL LRG LVL3 (GOWN DISPOSABLE) ×4
LENS IOL ACRYSOF IQ 20.5 (Intraocular Lens) ×1 IMPLANT
MARKER SKIN DUAL TIP RULER LAB (MISCELLANEOUS) ×2 IMPLANT
NDL FILTER BLUNT 18X1 1/2 (NEEDLE) ×1 IMPLANT
NEEDLE FILTER BLUNT 18X 1/2SAF (NEEDLE) ×1
NEEDLE FILTER BLUNT 18X1 1/2 (NEEDLE) ×1 IMPLANT
PACK EYE AFTER SURG (MISCELLANEOUS) ×2 IMPLANT
PACK OPTHALMIC (MISCELLANEOUS) ×2 IMPLANT
PACK PORFILIO (MISCELLANEOUS) ×2 IMPLANT
SYR 3ML LL SCALE MARK (SYRINGE) ×2 IMPLANT
SYR TB 1ML LUER SLIP (SYRINGE) ×2 IMPLANT
WATER STERILE IRR 250ML POUR (IV SOLUTION) ×2 IMPLANT
WIPE NON LINTING 3.25X3.25 (MISCELLANEOUS) ×2 IMPLANT

## 2020-09-15 NOTE — Anesthesia Postprocedure Evaluation (Signed)
Anesthesia Post Note  Patient: Brooke Mcdowell  Procedure(s) Performed: CATARACT EXTRACTION PHACO AND INTRAOCULAR LENS PLACEMENT (IOC) LEFT 8.63 00:52.2 (Left Eye)     Patient location during evaluation: PACU Anesthesia Type: MAC Level of consciousness: awake and alert Pain management: pain level controlled Vital Signs Assessment: post-procedure vital signs reviewed and stable Respiratory status: spontaneous breathing, nonlabored ventilation, respiratory function stable and patient connected to nasal cannula oxygen Cardiovascular status: stable and blood pressure returned to baseline Postop Assessment: no apparent nausea or vomiting Anesthetic complications: no   No complications documented.  Romanda Turrubiates, [place]

## 2020-09-15 NOTE — H&P (Signed)
[facility]   Primary Care Physician:  Trinna Post, PA-C Ophthalmologist: Dr. George Ina  Pre-Procedure History & Physical: HPI:  Brooke Mcdowell is a 69 y.o. female here for cataract surgery.   Past Medical History:  Diagnosis Date  . Ankle pain, left    previous strain  . Arthritis    hands and back  . Arthropathy of hand   . Dysthymic disorder   . Esophageal reflux   . History of kidney stones   . Insomnia   . Lumbar herniated disc    Gets injections in back 3 times a year  . Migraine    almost evrery day  . Osteoporosis   . Panic disorder   . Pure hypercholesterolemia   . Vertigo    with sinus infection  . Vitamin D deficiency     Past Surgical History:  Procedure Laterality Date  . CATARACT EXTRACTION W/PHACO Right 12/06/2018   Procedure: CATARACT EXTRACTION PHACO AND INTRAOCULAR LENS PLACEMENT (IOC)-RIGHT;  Surgeon: Marchia Meiers, MD;  Location: [facility] ORS;  Service: Ophthalmology;  Laterality: Right;  [place] 01:28.9 CDE 11.98 Fluid Pack Lot # [id number]  . clot removal from right sinus after surgery to remove tumor in 1995    . LITHOTRIPSY  [phone]   renal stones  . TUBAL LIGATION    . TUMOR REMOVAL     behind right ete in sinuses- 1995 Dr. Pryor Ochoa    Prior to Admission medications   Medication Sig Start Date End Date Taking? Authorizing Provider  acetaminophen (TYLENOL) 325 MG tablet Take 650 mg by mouth every 6 (six) hours as needed.   Yes [provider]  ALPRAZolam (XANAX XR) 2 MG 24 hr tablet Take 2 mg by mouth every morning.   Yes [provider]  ASPIRIN-ACETAMINOPHEN PO Take by mouth as needed.    Yes [provider]  estradiol (ESTRACE) 1 MG tablet TAKE 1 TABLET BY MOUTH DAILY 09/01/20  Yes [name] M, PA-C  ibuprofen (ADVIL) 200 MG tablet Take 200 mg by mouth every 6 (six) hours as needed.   Yes [provider]  Magnesium 250 MG TABS Take 500 mg by mouth every evening.    Yes [provider]  medroxyPROGESTERone (PROVERA) 2.5 MG tablet TAKE 1 TABLET(2.5 MG) BY MOUTH DAILY 09/01/20  Yes [name] M, PA-C  Multiple Vitamins-Minerals (ADULT GUMMY PO) Take 2 tablets by mouth every evening. Vitafusion MultiVites Gummy   Yes [provider]  omeprazole (PRILOSEC) 40 MG capsule TAKE 1 CAPSULE(40 MG) BY MOUTH DAILY 09/03/20  Yes Jonathon Bellows, MD  rizatriptan (MAXALT) 10 MG tablet Take 1 tablet for migraine. May repeat in 2 hours if needed. Maximum 30 mg daily. 09/12/17  Yes [name] M, PA-C  rosuvastatin (CRESTOR) 10 MG tablet TAKE 1 TABLET(10 MG) BY MOUTH DAILY 02/18/20  Yes [name] M, PA-C  sertraline (ZOLOFT) 50 MG tablet TAKE 1 TABLET(50 MG) BY MOUTH AT BEDTIME 09/01/20  Yes Pollak, Adriana M, PA-C  zolpidem (AMBIEN) 10 MG tablet TAKE 1 TABLET(10 MG) BY MOUTH AT BEDTIME AS NEEDED FOR SLEEP 07/15/20  Yes Pollak, Adriana M, PA-C  EQ GENTLE LUBRICANT 0.3 % SOLN Place 1 drop into both eyes at bedtime as needed (dry/irritated eyes.).    [provider]    Allergies as of 08/21/2020 - Review Complete 07/14/2020  Allergen Reaction Noted  . Gabapentin Other (See Comments) 11/01/2016  . Latex Itching and Other (See Comments) 12/03/2018  . Tetracyclines & related  Itching and Nausea And Vomiting 08/04/2016  . Sulfa antibiotics Rash 08/04/2016    Family History  Problem Relation Age of Onset  . Brain cancer Mother   . Cancer Mother        lung  . Cancer Father        lung   . Brain cancer Father     Social History   Socioeconomic History  . Marital status: Divorced    Spouse name: Not on file  . Number of children: 2  . Years of education: Not on file  . Highest education level: Some college, no degree  Occupational History  . Occupation: retired  Tobacco Use  . Smoking status: Never Smoker  . Smokeless tobacco: Never Used  Vaping Use  . Vaping Use: Never used  Substance and Sexual Activity  . Alcohol use: Yes    Comment: rarely - 1  drinks   . Drug use: No  . Sexual activity: Not on file  Other Topics Concern  . Not on file  Social History Narrative  . Not on file   Social Determinants of Health   Financial Resource Strain: Low Risk   . Difficulty of Paying Living Expenses: Not hard at all  Food Insecurity: No Food Insecurity  . Worried About Charity fundraiser in the Last Year: Never true  . Ran Out of Food in the Last Year: Never true  Transportation Needs: No Transportation Needs  . Lack of Transportation (Medical): No  . Lack of Transportation (Non-Medical): No  Physical Activity: Insufficiently Active  . Days of Exercise per Week: 3 days  . Minutes of Exercise per Session: 40 min  Stress: No Stress Concern Present  . Feeling of Stress : Not at all  Social Connections: Moderately Integrated  . Frequency of Communication with Friends and Family: More than three times a week  . Frequency of Social Gatherings with Friends and Family: More than three times a week  . Attends Religious Services: More than 4 times per year  . Active Member of Clubs or Organizations: No  . Attends Archivist Meetings: Never  . Marital Status: Living with partner  Intimate Partner Violence: Not At Risk  . Fear of Current or Ex-Partner: No  . Emotionally Abused: No  . Physically Abused: No  . Sexually Abused: No    Review of Systems: See HPI, otherwise negative ROS  Physical Exam: BP 134/85   Pulse 64   Temp 97.9 F (36.6 C) (Temporal)   Resp 15   Ht 5\' 1"  (1.549 m)   Wt 51.7 kg   SpO2 99%   BMI 21.54 kg/m  General:   Alert,  pleasant and cooperative in NAD Head:  Normocephalic and atraumatic. Respiratory:  Normal work of breathing. Heart:  Regular rate and rhythm.   Impression/Plan: Brooke Mcdowell is here for cataract surgery.  Risks, benefits, limitations, and alternatives regarding cataract surgery have been reviewed with the patient.  Questions have been answered.  All parties  agreeable.   Birder Robson, MD  09/15/2020, 8:47 AM

## 2020-09-15 NOTE — Op Note (Signed)
PREOPERATIVE DIAGNOSIS:  Nuclear sclerotic cataract of the left eye.   POSTOPERATIVE DIAGNOSIS:  Nuclear sclerotic cataract of the left eye.   OPERATIVE PROCEDURE:@   SURGEON:  Birder Robson, MD.   ANESTHESIA:  Anesthesiologist: Marice Potter, MD CRNA: Cameron Ali, CRNA  1.      Managed anesthesia care. 2.     0.65ml of Shugarcaine was instilled following the paracentesis   COMPLICATIONS:  None.   TECHNIQUE:   Stop and chop   DESCRIPTION OF PROCEDURE:  The patient was examined and consented in the preoperative holding area where the aforementioned topical anesthesia was applied to the left eye and then brought back to the Operating Room where the left eye was prepped and draped in the usual sterile ophthalmic fashion and a lid speculum was placed. A paracentesis was created with the side port blade and the anterior chamber was filled with viscoelastic. A near clear corneal incision was performed with the steel keratome. A continuous curvilinear capsulorrhexis was performed with a cystotome followed by the capsulorrhexis forceps. Hydrodissection and hydrodelineation were carried out with BSS on a blunt cannula. The lens was removed in a stop and chop  technique and the remaining cortical material was removed with the irrigation-aspiration handpiece. The capsular bag was inflated with viscoelastic and the Technis [id number] lens was placed in the capsular bag without complication. The remaining viscoelastic was removed from the eye with the irrigation-aspiration handpiece. The wounds were hydrated. The anterior chamber was flushed with BSS and the eye was inflated to physiologic pressure. 0.72ml Vigamox was placed in the anterior chamber. The wounds were found to be water tight. The eye was dressed with Combigan. The patient was given protective glasses to wear throughout the day and a shield with which to sleep tonight. The patient was also given drops with which to begin a drop regimen  today and will follow-up with me in one day. Implant Name Type Inv. Item Serial No. Manufacturer Lot No. LRB No. Used Action  LENS IOL ACRYSOF IQ 20.5 - [id number] Intraocular Lens LENS IOL ACRYSOF IQ 20.5 [id number] ALCON  Left 1 Implanted    Procedure(s): CATARACT EXTRACTION PHACO AND INTRAOCULAR LENS PLACEMENT (IOC) LEFT 8.63 00:52.2 (Left)  Electronically signed: Birder Robson 09/15/2020 9:10 AM

## 2020-09-15 NOTE — Anesthesia Procedure Notes (Signed)
Procedure Name: MAC Date/Time: 09/15/2020 8:54 AM Performed by: Cameron Ali, CRNA Pre-anesthesia Checklist: Patient identified, Emergency Drugs available, Suction available, Timeout performed and Patient being monitored Patient Re-evaluated:Patient Re-evaluated prior to induction Oxygen Delivery Method: Nasal cannula Placement Confirmation: positive ETCO2

## 2020-09-15 NOTE — Transfer of Care (Signed)
Immediate Anesthesia Transfer of Care Note  Patient: Brooke Mcdowell  Procedure(s) Performed: CATARACT EXTRACTION PHACO AND INTRAOCULAR LENS PLACEMENT (IOC) LEFT 8.63 00:52.2 (Left Eye)  Patient Location: PACU  Anesthesia Type: MAC  Level of Consciousness: awake, alert  and patient cooperative  Airway and Oxygen Therapy: Patient Spontanous Breathing and Patient connected to supplemental oxygen  Post-op Assessment: Post-op Vital signs reviewed, Patient's Cardiovascular Status Stable, Respiratory Function Stable, Patent Airway and No signs of Nausea or vomiting  Post-op Vital Signs: Reviewed and stable  Complications: No complications documented.

## 2020-09-15 NOTE — Anesthesia Preprocedure Evaluation (Signed)
Anesthesia Evaluation  Patient identified by MRN, date of birth, ID band Patient awake    Reviewed: Allergy & Precautions, H&P , NPO status , Patient's Chart, lab work & pertinent test results  History of Anesthesia Complications Negative for: history of anesthetic complications  Airway Mallampati: II  TM Distance: >3 FB     Dental  (+) Teeth Intact   Pulmonary neg pulmonary ROS, neg shortness of breath, neg COPD, neg recent URI,           Cardiovascular (-) angina(-) Past MI and (-) Cardiac Stents negative cardio ROS  (-) dysrhythmias      Neuro/Psych  Headaches, PSYCHIATRIC DISORDERS Anxiety Depression    GI/Hepatic Neg liver ROS, GERD  Controlled,  Endo/Other  negative endocrine ROS  Renal/GU      Musculoskeletal   Abdominal   Peds  Hematology negative hematology ROS (+)   Anesthesia Other Findings Past Medical History: No date: Arthropathy of hand No date: Dysthymic disorder No date: Esophageal reflux No date: History of kidney stones No date: Insomnia No date: Lumbar herniated disc No date: Migraine No date: Panic disorder No date: Pure hypercholesterolemia No date: Vitamin D deficiency  Past Surgical History: No date: clot removal from right sinus after surgery to remove tumor  in 1995 1993,1994,1995: LITHOTRIPSY     Comment:  renal stones No date: TUBAL LIGATION No date: TUMOR REMOVAL     Comment:  behind right ete in sinuses- 1995 Dr. Pryor Ochoa  BMI    Body Mass Index:  21.16 kg/m      Reproductive/Obstetrics negative OB ROS                             Anesthesia Physical  Anesthesia Plan  ASA: II  Anesthesia Plan: MAC   Post-op Pain Management:    Induction:   PONV Risk Score and Plan: 2 and Treatment may vary due to age or medical condition  Airway Management Planned: Natural Airway and Nasal Cannula  Additional Equipment:   Intra-op Plan:    Post-operative Plan:   Informed Consent: I have reviewed the patients History and Physical, chart, labs and discussed the procedure including the risks, benefits and alternatives for the proposed anesthesia with the patient or authorized representative who has indicated his/her understanding and acceptance.       Plan Discussed with: Anesthesiologist and CRNA  Anesthesia Plan Comments:         Anesthesia Quick Evaluation

## 2020-09-16 ENCOUNTER — Encounter: Payer: Self-pay | Admitting: Ophthalmology

## 2020-10-07 DIAGNOSIS — M25572 Pain in left ankle and joints of left foot: Secondary | ICD-10-CM | POA: Diagnosis not present

## 2020-10-07 DIAGNOSIS — M7672 Peroneal tendinitis, left leg: Secondary | ICD-10-CM | POA: Diagnosis not present

## 2020-10-07 DIAGNOSIS — S93492A Sprain of other ligament of left ankle, initial encounter: Secondary | ICD-10-CM | POA: Diagnosis not present

## 2020-10-19 ENCOUNTER — Other Ambulatory Visit: Payer: Self-pay | Admitting: Physician Assistant

## 2020-10-19 DIAGNOSIS — G4700 Insomnia, unspecified: Secondary | ICD-10-CM

## 2020-10-19 NOTE — Telephone Encounter (Signed)
Requested medication (s) are due for refill today - yes  Requested medication (s) are on the active medication list -yes  Future visit scheduled -yes  Last refill: 09/15/20  Notes to clinic: Request non delegated Rx  Requested Prescriptions  Pending Prescriptions Disp Refills   zolpidem (AMBIEN) 10 MG tablet [Pharmacy Med Name: ZOLPIDEM 10MG  TABLETS] 30 tablet     Sig: TAKE 1 TABLET(10 MG) BY MOUTH AT BEDTIME AS NEEDED FOR SLEEP      Not Delegated - Psychiatry:  Anxiolytics/Hypnotics Failed - 10/19/2020  1:11 PM      Failed - This refill cannot be delegated      Failed - Urine Drug Screen completed in last 360 days      Passed - Valid encounter within last 6 months    Recent Outpatient Visits           3 months ago Annual physical exam   [facility] [name] M, PA-C   9 months ago Cough   [place], [place], PA-C   10 months ago Pure hypercholesterolemia   [place], [place], PA-C   1 year ago Migraine without aura and without status migrainosus, not intractable   [facility] [place], Adriana M, PA-C   1 year ago Herpes labialis without complication   [place], Kelby Aline, FNP       Future Appointments             In 8 months Pollak, Wendee [place], PA-C [employer], [place]                 Requested Prescriptions  Pending Prescriptions Disp Refills   zolpidem (AMBIEN) 10 MG tablet [Pharmacy Med Name: ZOLPIDEM 10MG  TABLETS] 30 tablet     Sig: TAKE 1 TABLET(10 MG) BY MOUTH AT BEDTIME AS NEEDED FOR SLEEP      Not Delegated - Psychiatry:  Anxiolytics/Hypnotics Failed - 10/19/2020  1:11 PM      Failed - This refill cannot be delegated      Failed - Urine Drug Screen completed in last 360 days      Passed - Valid encounter within last 6 months    Recent Outpatient Visits           3 months ago Annual physical exam   [facility] [name] M, PA-C   9 months ago Cough   [facility] [name] M, [place]   10 months ago Pure hypercholesterolemia   [place], [place], [place]   1 year ago Migraine without aura and without status migrainosus, not intractable   [place], PA-C   1 year ago Herpes labialis without complication   [place], Kelby Aline, FNP       Future Appointments             In 8 months Terrilee Croak, Wendee Beavers, [place], [facility]

## 2020-10-21 DIAGNOSIS — D225 Melanocytic nevi of trunk: Secondary | ICD-10-CM | POA: Diagnosis not present

## 2020-10-21 DIAGNOSIS — D2261 Melanocytic nevi of right upper limb, including shoulder: Secondary | ICD-10-CM | POA: Diagnosis not present

## 2020-10-21 DIAGNOSIS — D2262 Melanocytic nevi of left upper limb, including shoulder: Secondary | ICD-10-CM | POA: Diagnosis not present

## 2020-10-21 DIAGNOSIS — L570 Actinic keratosis: Secondary | ICD-10-CM | POA: Diagnosis not present

## 2020-10-21 DIAGNOSIS — X32XXXA Exposure to sunlight, initial encounter: Secondary | ICD-10-CM | POA: Diagnosis not present

## 2020-10-21 DIAGNOSIS — D2272 Melanocytic nevi of left lower limb, including hip: Secondary | ICD-10-CM | POA: Diagnosis not present

## 2020-10-21 DIAGNOSIS — Z85828 Personal history of other malignant neoplasm of skin: Secondary | ICD-10-CM | POA: Diagnosis not present

## 2020-10-23 ENCOUNTER — Telehealth (INDEPENDENT_AMBULATORY_CARE_PROVIDER_SITE_OTHER): Payer: PPO | Admitting: Adult Health

## 2020-10-23 ENCOUNTER — Encounter: Payer: Self-pay | Admitting: Adult Health

## 2020-10-23 DIAGNOSIS — R059 Cough, unspecified: Secondary | ICD-10-CM | POA: Diagnosis not present

## 2020-10-23 DIAGNOSIS — J029 Acute pharyngitis, unspecified: Secondary | ICD-10-CM | POA: Insufficient documentation

## 2020-10-23 DIAGNOSIS — R1310 Dysphagia, unspecified: Secondary | ICD-10-CM | POA: Diagnosis not present

## 2020-10-23 DIAGNOSIS — J039 Acute tonsillitis, unspecified: Secondary | ICD-10-CM

## 2020-10-23 DIAGNOSIS — Z20822 Contact with and (suspected) exposure to covid-19: Secondary | ICD-10-CM | POA: Diagnosis not present

## 2020-10-23 DIAGNOSIS — J0140 Acute pansinusitis, unspecified: Secondary | ICD-10-CM | POA: Diagnosis not present

## 2020-10-23 MED ORDER — AMOXICILLIN-POT CLAVULANATE 875-125 MG PO TABS
1.0000 | ORAL_TABLET | Freq: Two times a day (BID) | ORAL | 0 refills | Status: DC
Start: 2020-10-23 — End: 2021-07-15

## 2020-10-23 MED ORDER — HYDROCOD POLST-CPM POLST ER 10-8 MG/5ML PO SUER: 5.0000 mL | Freq: Two times a day (BID) | ORAL | 0 refills | Status: DC | PRN

## 2020-10-23 MED ORDER — PREDNISONE 10 MG (21) PO TBPK: ORAL_TABLET | ORAL | 0 refills | Status: DC

## 2020-10-23 NOTE — Progress Notes (Signed)
Virtual Visit via Telephone Note  I connected with Brooke Mcdowell on 10/23/20 at  9:00 AM EST by telephone and verified that I am speaking with the correct person using two identifiers.  Parties involved in visit as below:   Location: Patient: at home  Provider: Provider: Provider's office at  [facility] [place].  I discussed the limitations, risks, security and privacy concerns of performing an evaluation and management service by telephone and the availability of in person appointments. I also discussed with the patient that there may be a patient responsible charge related to this service. The patient expressed understanding and agreed to proceed. History of Present Illness:  Patient reports she was sick on 09/26/20 with congestion and it lasted, she had a week where she thought she was  feeling better and then she started with worsening sinus symptoms and sore throat that seems to not be resolving. She then started with sinus congestion/ throat.   Denies any strep or mono exposure or any other exposure. No other recent illness other than the above noted 09/26/20 of which time she also tested covid negative. . Does have White places on her left tonsil, painful, no problems swallowing.   Three covid test negative taken this week. She denies any distress or problems breathing, most of her symptoms are in her throat and sinuses, with aggravating post nasal cough. Thick mucous in back of throat. Shecreports she has been checking her oxygen saturation and has no abnormal readings.  Denies chest congestion. Post nasal cough keeping her up at night.   Observations/Objective:   Patient is alert and oriented and responsive to questions Engages in conversation with provider. Speaks in full sentences without any pauses without any shortness of breath or distress.   Assessment and Plan:  Acute non-recurrent pansinusitis - Plan: amoxicillin-clavulanate (AUGMENTIN) 875-125 MG  tablet, chlorpheniramine-HYDROcodone (TUSSIONEX PENNKINETIC ER) 10-8 MG/5ML SUER  Cough - Plan: chlorpheniramine-HYDROcodone (TUSSIONEX PENNKINETIC ER) 10-8 MG/5ML SUER  Pharyngotonsillitis - Plan: predniSONE (STERAPRED UNI-PAK 21 TAB) 10 MG (21) TBPK tablet  Acute pharyngitis, unspecified etiology- suspect strep - Plan: predniSONE (STERAPRED UNI-PAK 21 TAB) 10 MG (21) TBPK tablet, chlorpheniramine-HYDROcodone (TUSSIONEX PENNKINETIC ER) 10-8 MG/5ML SUER  Lab test negative for COVID-19 virus  Painful swallowing   Symptomatic treatment discussed.   Follow Up Instructions: Red Flags discussed. The patient was given clear instructions to go to ER or return to medical center if any red flags develop, symptoms do not improve, worsen or new problems develop. They verbalized understanding.  Strict return precautions discussed,   I discussed the limitations of evaluation and management by telemedicine and the availability of in person appointments. The patient expressed understanding and agreed to proceed.    I discussed the assessment and treatment plan with the patient. The patient was provided an opportunity to ask questions and all were answered. The patient agreed with the plan and demonstrated an understanding of the instructions.   The patient was advised to call back or seek an in-person evaluation if the symptoms worsen or if the condition fails to improve as anticipated.  I provided 20 minutes of non-face-to-face time during this encounter.   Marcille Buffy, FNP

## 2020-10-23 NOTE — Patient Instructions (Addendum)
Warm salt water gargles three times a day should help sore throat symptoms as well.   Red Flags discussed. The patient was given clear instructions to go to ER or return to medical center if any red flags develop, symptoms do not improve, worsen or new problems develop. They verbalized understanding.    Prednisone tablets What is this medicine? PREDNISONE (PRED ni sone) is a corticosteroid. It is commonly used to treat inflammation of the skin, joints, lungs, and other organs. Common conditions treated include asthma, allergies, and arthritis. It is also used for other conditions, such as blood disorders and diseases of the adrenal glands. This medicine may be used for other purposes; ask your health care provider or pharmacist if you have questions. COMMON BRAND NAME(S): Deltasone, Predone, Sterapred, Sterapred DS What should I tell my health care provider before I take this medicine? They need to know if you have any of these conditions:  Cushing's syndrome  diabetes  glaucoma  heart disease  high blood pressure  infection (especially a virus infection such as chickenpox, cold sores, or herpes)  kidney disease  liver disease  mental illness  myasthenia gravis  osteoporosis  seizures  stomach or intestine problems  thyroid disease  an unusual or allergic reaction to lactose, prednisone, other medicines, foods, dyes, or preservatives  pregnant or trying to get pregnant  breast-feeding How should I use this medicine? Take this medicine by mouth with a glass of water. Follow the directions on the prescription label. Take this medicine with food. If you are taking this medicine once a day, take it in the morning. Do not take more medicine than you are told to take. Do not suddenly stop taking your medicine because you may develop a severe reaction. Your doctor will tell you how much medicine to take. If your doctor wants you to stop the medicine, the dose may be slowly  lowered over time to avoid any side effects. Talk to your pediatrician regarding the use of this medicine in children. Special care may be needed. Overdosage: If you think you have taken too much of this medicine contact a poison control center or emergency room at once. NOTE: This medicine is only for you. Do not share this medicine with others. What if I miss a dose? If you miss a dose, take it as soon as you can. If it is almost time for your next dose, talk to your doctor or health care professional. You may need to miss a dose or take an extra dose. Do not take double or extra doses without advice. What may interact with this medicine? Do not take this medicine with any of the following medications:  metyrapone  mifepristone This medicine may also interact with the following medications:  aminoglutethimide  amphotericin B  aspirin and aspirin-like medicines  barbiturates  certain medicines for diabetes, like glipizide or glyburide  cholestyramine  cholinesterase inhibitors  cyclosporine  digoxin  diuretics  ephedrine  female hormones, like estrogens and birth control pills  isoniazid  ketoconazole  NSAIDS, medicines for pain and inflammation, like ibuprofen or naproxen  phenytoin  rifampin  toxoids  vaccines  warfarin This list may not describe all possible interactions. Give your health care provider a list of all the medicines, herbs, non-prescription drugs, or dietary supplements you use. Also tell them if you smoke, drink alcohol, or use illegal drugs. Some items may interact with your medicine. What should I watch for while using this medicine? Visit your doctor or  health care professional for regular checks on your progress. If you are taking this medicine over a prolonged period, carry an identification card with your name and address, the type and dose of your medicine, and your doctor's name and address. This medicine may increase your risk of  getting an infection. Tell your doctor or health care professional if you are around anyone with measles or chickenpox, or if you develop sores or blisters that do not heal properly. If you are going to have surgery, tell your doctor or health care professional that you have taken this medicine within the last twelve months. Ask your doctor or health care professional about your diet. You may need to lower the amount of salt you eat. This medicine may increase blood sugar. Ask your healthcare provider if changes in diet or medicines are needed if you have diabetes. What side effects may I notice from receiving this medicine? Side effects that you should report to your doctor or health care professional as soon as possible:  allergic reactions like skin rash, itching or hives, swelling of the face, lips, or tongue  changes in emotions or moods  changes in vision  depressed mood  eye pain  fever or chills, cough, sore throat, pain or difficulty passing urine  signs and symptoms of high blood sugar such as being more thirsty or hungry or having to urinate more than normal. You may also feel very tired or have blurry vision.  swelling of ankles, feet Side effects that usually do not require medical attention (report to your doctor or health care professional if they continue or are bothersome):  confusion, excitement, restlessness  headache  nausea, vomiting  skin problems, acne, thin and shiny skin  trouble sleeping  weight gain This list may not describe all possible side effects. Call your doctor for medical advice about side effects. You may report side effects to FDA at 1-800-FDA-1088. Where should I keep my medicine? Keep out of the reach of children. Store at room temperature between 15 and 30 degrees C (59 and 86 degrees F). Protect from light. Keep container tightly closed. Throw away any unused medicine after the expiration date. NOTE: This sheet is a summary. It may not  cover all possible information. If you have questions about this medicine, talk to your doctor, pharmacist, or health care provider.  2021 Elsevier/Gold Standard (2018-06-19 10:54:22) Amoxicillin; Clavulanic Acid Tablets What is this medicine? AMOXICILLIN; CLAVULANIC ACID (a mox i SIL in; KLAV yoo lan ic AS id) is a penicillin antibiotic. It treats some infections caused by bacteria. It will not work for colds, the flu, or other viruses. This medicine may be used for other purposes; ask your health care provider or pharmacist if you have questions. COMMON BRAND NAME(S): Augmentin What should I tell my health care provider before I take this medicine? They need to know if you have any of these conditions:  kidney disease  liver disease  mononucleosis  stomach or intestine problems such as colitis  an unusual or allergic reaction to amoxicillin, other penicillin or cephalosporin antibiotics, clavulanic acid, other medicines, foods, dyes, or preservatives  pregnant or trying to get pregnant  breast-feeding How should I use this medicine? Take this drug by mouth. Take it as directed on the prescription label at the same time every day. Take it with food at the start of a meal or snack. Take all of this drug unless your health care provider tells you to stop it early. Keep  taking it even if you think you are better. Talk to your health care provider about the use of this drug in children. While it may be prescribed for selected conditions, precautions do apply. Overdosage: If you think you have taken too much of this medicine contact a poison control center or emergency room at once. NOTE: This medicine is only for you. Do not share this medicine with others. What if I miss a dose? If you miss a dose, take it as soon as you can. If it is almost time for your next dose, take only that dose. Do not take double or extra doses. What may interact with this  medicine?  allopurinol  anticoagulants  birth control pills  methotrexate  probenecid This list may not describe all possible interactions. Give your health care provider a list of all the medicines, herbs, non-prescription drugs, or dietary supplements you use. Also tell them if you smoke, drink alcohol, or use illegal drugs. Some items may interact with your medicine. What should I watch for while using this medicine? Tell your health care provider if your symptoms do not start to get better or if they get worse. This medicine may cause serious skin reactions. They can happen weeks to months after starting the medicine. Contact your health care provider right away if you notice fevers or flu-like symptoms with a rash. The rash may be red or purple and then turn into blisters or peeling of the skin. Or, you might notice a red rash with swelling of the face, lips or lymph nodes in your neck or under your arms. Do not treat diarrhea with over the counter products. Contact your health care provider if you have diarrhea that lasts more than 2 days or if it is severe and watery. If you have diabetes, you may get a false-positive result for sugar in your urine. Check with your health care provider. Birth control may not work properly while you are taking this medicine. Talk to your health care provider about using an extra method of birth control. What side effects may I notice from receiving this medicine? Side effects that you should report to your doctor or health care provider as soon as possible:  allergic reactions (skin rash, itching or hives; swelling of the face, lips, or tongue)  bloody or watery diarrhea  dark urine  infection (fever, chills, cough, sore throat, or pain)  kidney injury (trouble passing urine or change in the amount of urine)  redness, blistering, peeling, or loosening of the skin, including inside the mouth  seizures  thrush (white patches in the mouth or  mouth sores)  trouble breathing  unusual bruising or bleeding  unusually weak or tired Side effects that usually do not require medical attention (report to your doctor or health care provider if they continue or are bothersome):  diarrhea  dizziness  headache  nausea, vomiting  unusual vaginal discharge, itching, or odor  upset stomach This list may not describe all possible side effects. Call your doctor for medical advice about side effects. You may report side effects to FDA at 1-800-FDA-1088. Where should I keep my medicine? Keep out of the reach of children and pets. Store at room temperature between 20 and 25 degrees C (68 and 77 degrees F). Throw away any unused drug after the expiration date. NOTE: This sheet is a summary. It may not cover all possible information. If you have questions about this medicine, talk to your doctor, pharmacist, or health care  provider.  2021 Elsevier/Gold Standard (2020-08-12 09:45:03) Chlorpheniramine; Hydrocodone oral solution or suspension What is this medicine? CHLORPHENIRAMINE; HYDROCODONE (klor fen IR a meen; hye droe KOE done) is a combination of an antihistamine and cough suppressant. It is used to treat the symptoms of allergies and colds. This medicine may be used for other purposes; ask your health care provider or pharmacist if you have questions. COMMON BRAND NAME(S): HyTan, Novasus, S-T Forte 2, Tussionex, VITUZ What should I tell my health care provider before I take this medicine? They need to know if you have any of these conditions:  Addison's disease  brain tumor  gallbladder disease  glaucoma  head injury  heart disease  history of a drug or alcohol abuse problem  history of irregular heartbeat  if you often drink alcohol  kidney disease  liver disease  low blood pressure  lung or breathing disease, like asthma  mental illness  pancreatic disease  seizures  stomach or intestine  problems  thyroid disease  trouble passing urine  an unusual or allergic reaction to chlorpheniramine, hydrocodone, other medicines, foods, dyes, or preservatives  pregnant or trying to get pregnant  breast-feeding How should I use this medicine? Take this medicine by mouth. Follow the directions on the prescription label. Shake well before using. You can take it with or without food. If it upsets your stomach, take it with food. Use a specially marked spoon or container to measure each dose. Ask your pharmacist if you do not have one. Household spoons are not accurate. Do not to overfill. Rinse the measuring device with water after each use. Take your medicine at regular intervals. Do not take it more often than directed. A special MedGuide will be given to you by the pharmacist with each prescription and refill. Be sure to read this information carefully each time. Talk to your pediatrician regarding the use of this medicine in children. This medicine is not approved for use in children. Overdosage: If you think you have taken too much of this medicine contact a poison control center or emergency room at once. NOTE: This medicine is only for you. Do not share this medicine with others. What if I miss a dose? If you miss a dose, take it as soon as you can. If it is almost time for your next dose, take only that dose. Do not take double or extra doses. What may interact with this medicine? Do not take this medicine with any of the following medications:  alcohol  certain medicines for anxiety or sleep  certain medicines for depression like amitriptyline, fluoxetine, sertraline  certain medicines for seizures like phenobarbital, phenytoin, primidone  general anesthetics like halothane, isoflurane, methoxyflurane, propofol  local anesthetics like lidocaine, pramoxine, tetracaine  MAOIs like Carbex, Eldepryl, Nardil, and Parnate  other antihistamines for allergy, cough and  cold  other narcotic medicines (opiates) for pain or cough  phenothiazines like chlorpromazine, mesoridazine, prochlorperazine, thioridazine This medicine may also interact with the following medications:  antiviral medicines for HIV or AIDS  atropine  certain antibiotics like clarithromycin, erythromycin  certain medicines for bladder problems like oxybutynin, tolterodine  certain medicines for fungal infections like ketoconazole and itraconazole  certain medicines for Parkinson's disease like benztropine, trihexyphenidyl  certain medicines for stomach problems like dicyclomine, hyoscyamine  certain medicines for travel sickness like scopolamine  ipratropium  rifampin This list may not describe all possible interactions. Give your health care provider a list of all the medicines, herbs, non-prescription drugs, or dietary  supplements you use. Also tell them if you smoke, drink alcohol, or use illegal drugs. Some items may interact with your medicine. What should I watch for while using this medicine? Use exactly as directed by your doctor or health care professional. Do not take more than the recommended dose. You may develop tolerance to this medicine if you take it for a long time. Tolerance means that you will get less cough relief with time. Tell your doctor or health care professional if your symptoms do not improve or if they get worse. If you have been taking this medicine for a long time, do not suddenly stop taking it because you may develop a severe reaction. Your body becomes used to the medicine. This does NOT mean you are addicted. Addiction is a behavior related to getting and using a drug for a nonmedical reason. If your doctor wants you to stop the medicine, the dose will be slowly lowered over time to avoid any side effects. There are different types of narcotic medicines (opiates). If you take more than one type at the same time or if you are taking another medicine  that also causes drowsiness, you may have more side effects. Give your health care provider a list of all medicines you use. Your doctor will tell you how much medicine to take. Do not take more medicine than directed. Call emergency for help if you have problems breathing or unusual sleepiness. You may get drowsy or dizzy. Do not drive, use machinery, or do anything that needs mental alertness until you know how this medicine affects you. Do not stand or sit up quickly, especially if you are an older patient. This reduces the risk of dizzy or fainting spells. Alcohol may interfere with the effect of this medicine. Avoid alcoholic drinks. The medicine will cause constipation. Try to have a bowel movement at least every 2 to 3 days. If you do not have a bowel movement for 3 days, call your doctor or health care professional. Your mouth may get dry. Chewing sugarless gum or sucking hard candy, and drinking plenty of water may help. Contact your doctor if the problem does not go away or is severe. This medicine may cause dry eyes and blurred vision. If you wear contact lenses, you may feel some discomfort. Lubricating drops may help. See your eye doctor if the problem does not go away or is severe. What side effects may I notice from receiving this medicine? Side effects that you should report to your doctor or health care professional as soon as possible:  allergic reactions like skin rash, itching or hives, swelling of the face, lips, or tongue  breathing problems  confusion  signs and symptoms of low blood pressure like dizziness; feeling faint or lightheaded, falls; unusually weak or tired  trouble passing urine or change in the amount of urine Side effects that usually do not require medical attention (report to your doctor or health care professional if they continue or are bothersome):  constipation  dry mouth  nausea, vomiting  tiredness This list may not describe all possible side  effects. Call your doctor for medical advice about side effects. You may report side effects to FDA at 1-800-FDA-1088. Where should I keep my medicine? Keep out of the reach of children. This medicine can be abused. Keep this medicine in a safe place to protect it from theft. Do not share this medicine with anyone. Selling or giving away this medicine is dangerous and is against  the law. This medicine may cause accidental overdose and death if taken by other adults, children, or pets. Mix any unused medicine with a substance like cat littler or coffee grounds. Then throw the medicine away in a sealed container like a sealed bag or a coffee can with a lid. Do not use the medicine after the expiration date. Store at room temperature between 15 and 30 degrees C (59 and 86 degrees F). Do not freeze. Keep container tightly closed. NOTE: This sheet is a summary. It may not cover all possible information. If you have questions about this medicine, talk to your doctor, pharmacist, or health care provider.  2021 Elsevier/Gold Standard (2017-04-18 16:10:22) Sinusitis, Adult Sinusitis is soreness and swelling (inflammation) of your sinuses. Sinuses are hollow spaces in the bones around your face. They are located:  Around your eyes.  In the middle of your forehead.  Behind your nose.  In your cheekbones. Your sinuses and nasal passages are lined with a fluid called mucus. Mucus drains out of your sinuses. Swelling can trap mucus in your sinuses. This lets germs (bacteria, virus, or fungus) grow, which leads to infection. Most of the time, this condition is caused by a virus. What are the causes? This condition is caused by:  Allergies.  Asthma.  Germs.  Things that block your nose or sinuses.  Growths in the nose (nasal polyps).  Chemicals or irritants in the air.  Fungus (rare). What increases the risk? You are more likely to develop this condition if:  You have a weak body defense system  (immune system).  You do a lot of swimming or diving.  You use nasal sprays too much.  You smoke. What are the signs or symptoms? The main symptoms of this condition are pain and a feeling of pressure around the sinuses. Other symptoms include:  Stuffy nose (congestion).  Runny nose (drainage).  Swelling and warmth in the sinuses.  Headache.  Toothache.  A cough that may get worse at night.  Mucus that collects in the throat or the back of the nose (postnasal drip).  Being unable to smell and taste.  Being very tired (fatigue).  A fever.  Sore throat.  Bad breath. How is this diagnosed? This condition is diagnosed based on:  Your symptoms.  Your medical history.  A physical exam.  Tests to find out if your condition is short-term (acute) or long-term (chronic). Your doctor may: ? Check your nose for growths (polyps). ? Check your sinuses using a tool that has a light (endoscope). ? Check for allergies or germs. ? Do imaging tests, such as an MRI or CT scan. How is this treated? Treatment for this condition depends on the cause and whether it is short-term or long-term.  If caused by a virus, your symptoms should go away on their own within 10 days. You may be given medicines to relieve symptoms. They include: ? Medicines that shrink swollen tissue in the nose. ? Medicines that treat allergies (antihistamines). ? A spray that treats swelling of the nostrils. ? Rinses that help get rid of thick mucus in your nose (nasal saline washes).  If caused by bacteria, your doctor may wait to see if you will get better without treatment. You may be given antibiotic medicine if you have: ? A very bad infection. ? A weak body defense system.  If caused by growths in the nose, you may need to have surgery. Follow these instructions at home: Medicines  Take, use,  or apply over-the-counter and prescription medicines only as told by your doctor. These may include nasal  sprays.  If you were prescribed an antibiotic medicine, take it as told by your doctor. Do not stop taking the antibiotic even if you start to feel better. Hydrate and humidify  Drink enough water to keep your pee (urine) pale yellow.  Use a cool mist humidifier to keep the humidity level in your home above 50%.  Breathe in steam for 10-15 minutes, 3-4 times a day, or as told by your doctor. You can do this in the bathroom while a hot shower is running.  Try not to spend time in cool or dry air.   Rest  Rest as much as you can.  Sleep with your head raised (elevated).  Make sure you get enough sleep each night. General instructions  Put a warm, moist washcloth on your face 3-4 times a day, or as often as told by your doctor. This will help with discomfort.  Wash your hands often with soap and water. If there is no soap and water, use hand sanitizer.  Do not smoke. Avoid being around people who are smoking (secondhand smoke).  Keep all follow-up visits as told by your doctor. This is important.   Contact a doctor if:  You have a fever.  Your symptoms get worse.  Your symptoms do not get better within 10 days. Get help right away if:  You have a very bad headache.  You cannot stop throwing up (vomiting).  You have very bad pain or swelling around your face or eyes.  You have trouble seeing.  You feel confused.  Your neck is stiff.  You have trouble breathing. Summary  Sinusitis is swelling of your sinuses. Sinuses are hollow spaces in the bones around your face.  This condition is caused by tissues in your nose that become inflamed or swollen. This traps germs. These can lead to infection.  If you were prescribed an antibiotic medicine, take it as told by your doctor. Do not stop taking it even if you start to feel better.  Keep all follow-up visits as told by your doctor. This is important. This information is not intended to replace advice given to you by  your health care provider. Make sure you discuss any questions you have with your health care provider. Document Revised: 02/19/2018 Document Reviewed: 02/19/2018 Elsevier Patient Education  2021 [place]. Pharyngitis  Pharyngitis is a sore throat (pharynx). This is when there is redness, pain, and swelling in your throat. Most of the time, this condition gets better on its own. In some cases, you may need medicine. Follow these instructions at home:  Take over-the-counter and prescription medicines only as told by your doctor. ? If you were prescribed an antibiotic medicine, take it as told by your doctor. Do not stop taking the antibiotic even if you start to feel better. ? Do not give children aspirin. Aspirin has been linked to Reye syndrome.  Drink enough water and fluids to keep your pee (urine) clear or pale yellow.  Get a lot of rest.  Rinse your mouth (gargle) with a salt-water mixture 3-4 times a day or as needed. To make a salt-water mixture, completely dissolve -1 tsp of salt in 1 cup of warm water.  If your doctor approves, you may use throat lozenges or sprays to soothe your throat. Contact a doctor if:  You have large, tender lumps in your neck.  You have a  rash.  You cough up green, yellow-brown, or bloody spit. Get help right away if:  You have a stiff neck.  You drool or cannot swallow liquids.  You cannot drink or take medicines without throwing up.  You have very bad pain that does not go away with medicine.  You have problems breathing, and it is not from a stuffy nose.  You have new pain and swelling in your knees, ankles, wrists, or elbows. Summary  Pharyngitis is a sore throat (pharynx). This is when there is redness, pain, and swelling in your throat.  If you were prescribed an antibiotic medicine, take it as told by your doctor. Do not stop taking the antibiotic even if you start to feel better.  Most of the time, pharyngitis gets better on  its own. Sometimes, you may need medicine. This information is not intended to replace advice given to you by your health care provider. Make sure you discuss any questions you have with your health care provider. Document Revised: 09/01/2017 Document Reviewed: 10/25/2016 Elsevier Patient Education  2021 [employer].

## 2020-10-28 DIAGNOSIS — Z961 Presence of intraocular lens: Secondary | ICD-10-CM | POA: Diagnosis not present

## 2020-12-09 DIAGNOSIS — M65311 Trigger thumb, right thumb: Secondary | ICD-10-CM | POA: Diagnosis not present

## 2020-12-18 ENCOUNTER — Other Ambulatory Visit: Payer: Self-pay | Admitting: Physician Assistant

## 2020-12-18 DIAGNOSIS — G4700 Insomnia, unspecified: Secondary | ICD-10-CM

## 2020-12-18 NOTE — Telephone Encounter (Signed)
Requested medications are due for refill today yes  Requested medications are on the active medication list yes  Last refill 2/16  Last visit 10/23/20  Future visit scheduled 07/2021  Notes to clinic Not Delegated.

## 2020-12-21 NOTE — Telephone Encounter (Signed)
This is not one of our patients here at [facility]

## 2020-12-22 ENCOUNTER — Other Ambulatory Visit: Payer: Self-pay | Admitting: Physician Assistant

## 2020-12-22 DIAGNOSIS — G4700 Insomnia, unspecified: Secondary | ICD-10-CM

## 2020-12-22 NOTE — Telephone Encounter (Signed)
Requested medication (s) are due for refill today: yes  Requested medication (s) are on the active medication list: yes  Last refill:10/19/20  #30  1 refill  Future visit scheduled  yes 07/15/21  Notes to clinic: Not delegated  Requested Prescriptions  Pending Prescriptions Disp Refills   zolpidem (AMBIEN) 10 MG tablet [Pharmacy Med Name: ZOLPIDEM 10MG  TABLETS] 30 tablet     Sig: TAKE 1 TABLET(10 MG) BY MOUTH AT BEDTIME AS NEEDED FOR SLEEP      Not Delegated - Psychiatry:  Anxiolytics/Hypnotics Failed - 12/22/2020  6:41 PM      Failed - This refill cannot be delegated      Failed - Urine Drug Screen completed in last 360 days      Passed - Valid encounter within last 6 months    Recent Outpatient Visits           2 months ago Acute non-recurrent pansinusitis   [place], Kelby Aline, FNP   5 months ago Annual physical exam   [facility] [name] M, [place]   11 months ago Cough   [facility] [place], Wendee Beavers, [place]   1 year ago Pure hypercholesterolemia   [place], [place], [place]   1 year ago Migraine without aura and without status migrainosus, not intractable   [place], Wendee Beavers, [place]       Future Appointments             In 6 months Terrilee Croak, Wendee Beavers, PA-C [employer], [facility]

## 2021-04-02 DIAGNOSIS — M19031 Primary osteoarthritis, right wrist: Secondary | ICD-10-CM | POA: Diagnosis not present

## 2021-04-02 DIAGNOSIS — M25541 Pain in joints of right hand: Secondary | ICD-10-CM | POA: Diagnosis not present

## 2021-04-28 DIAGNOSIS — M1811 Unilateral primary osteoarthritis of first carpometacarpal joint, right hand: Secondary | ICD-10-CM | POA: Diagnosis not present

## 2021-05-02 ENCOUNTER — Other Ambulatory Visit: Payer: Self-pay | Admitting: Family Medicine

## 2021-05-02 DIAGNOSIS — G4700 Insomnia, unspecified: Secondary | ICD-10-CM

## 2021-05-07 ENCOUNTER — Other Ambulatory Visit: Payer: Self-pay

## 2021-05-07 DIAGNOSIS — F419 Anxiety disorder, unspecified: Secondary | ICD-10-CM

## 2021-05-07 MED ORDER — SERTRALINE HCL 50 MG PO TABS
50.0000 mg | ORAL_TABLET | Freq: Every day | ORAL | 1 refills | Status: DC
Start: 2021-05-07 — End: 2021-07-15

## 2021-05-07 NOTE — Telephone Encounter (Signed)
Copied from [place] [phone]. Topic: Quick Communication - Rx Refill/Question >> May 07, 2021  1:05 PM Pawlus, Brayton Layman A wrote: Pt was following up on a refill request for sertraline (ZOLOFT) 50 MG tablet, please advise if pt can be scheduled in with the new provider if unable to go ahead with her refill requests.

## 2021-05-07 NOTE — Telephone Encounter (Signed)
Ok to refill? Patient has a f/u appt with you on 07/15/21. Please advise. Thanks!

## 2021-05-10 ENCOUNTER — Other Ambulatory Visit: Payer: Self-pay | Admitting: Physician Assistant

## 2021-05-10 DIAGNOSIS — E7800 Pure hypercholesterolemia, unspecified: Secondary | ICD-10-CM

## 2021-05-10 MED ORDER — ROSUVASTATIN CALCIUM 10 MG PO TABS: ORAL_TABLET | ORAL | 0 refills | Status: DC

## 2021-05-10 NOTE — Telephone Encounter (Signed)
Medication: rosuvastatin (CRESTOR) 10 MG tablet [id number]   Has the patient contacted their pharmacy? YES (Agent: If no, request that the patient contact the pharmacy for the refill.) (Agent: If yes, when and what did the pharmacy advise?)  Preferred Pharmacy (with phone number or street name): [id number] [address], [place] [address] [place] [address] Phone: [phone] Fax: [phone] Hours: Not open 24 hours    Agent: Please be advised that RX refills may take up to 3 business days. We ask that you follow-up with your pharmacy.

## 2021-05-20 ENCOUNTER — Telehealth: Payer: Self-pay | Admitting: Gastroenterology

## 2021-05-20 NOTE — Telephone Encounter (Signed)
Patient wants a refill omeprazole (PRILOSEC) 40 MG capsule . Patient hasn't been seen in office since October of 2019.Clinical staff will follow up with patient.

## 2021-05-21 ENCOUNTER — Other Ambulatory Visit: Payer: Self-pay

## 2021-05-21 MED ORDER — OMEPRAZOLE 40 MG PO CPDR: 40.0000 mg | DELAYED_RELEASE_CAPSULE | Freq: Every day | ORAL | 0 refills | Status: DC

## 2021-05-21 NOTE — Telephone Encounter (Signed)
Patient message was sent via MyChart letting her know that we sent a one month refill on her Omeprazole 40 MG, however, she will need an appointment to come in to see DR. Vicente Males if she wants more.

## 2021-05-26 DIAGNOSIS — M5416 Radiculopathy, lumbar region: Secondary | ICD-10-CM | POA: Diagnosis not present

## 2021-05-26 DIAGNOSIS — M48062 Spinal stenosis, lumbar region with neurogenic claudication: Secondary | ICD-10-CM | POA: Diagnosis not present

## 2021-06-01 DIAGNOSIS — M5441 Lumbago with sciatica, right side: Secondary | ICD-10-CM | POA: Diagnosis not present

## 2021-06-24 DIAGNOSIS — J329 Chronic sinusitis, unspecified: Secondary | ICD-10-CM | POA: Diagnosis not present

## 2021-06-24 DIAGNOSIS — R42 Dizziness and giddiness: Secondary | ICD-10-CM | POA: Diagnosis not present

## 2021-06-24 DIAGNOSIS — E569 Vitamin deficiency, unspecified: Secondary | ICD-10-CM | POA: Diagnosis not present

## 2021-06-24 DIAGNOSIS — G43719 Chronic migraine without aura, intractable, without status migrainosus: Secondary | ICD-10-CM | POA: Diagnosis not present

## 2021-06-29 ENCOUNTER — Other Ambulatory Visit: Payer: Self-pay | Admitting: Student

## 2021-06-29 DIAGNOSIS — G43719 Chronic migraine without aura, intractable, without status migrainosus: Secondary | ICD-10-CM

## 2021-07-01 DIAGNOSIS — M3501 Sicca syndrome with keratoconjunctivitis: Secondary | ICD-10-CM | POA: Diagnosis not present

## 2021-07-06 ENCOUNTER — Ambulatory Visit
Admission: RE | Admit: 2021-07-06 | Discharge: 2021-07-06 | Disposition: A | Discharge status: Home / Self Care | Payer: PPO | Source: Ambulatory Visit | Attending: Student | Admitting: Student

## 2021-07-06 ENCOUNTER — Other Ambulatory Visit: Payer: Self-pay

## 2021-07-06 DIAGNOSIS — G43909 Migraine, unspecified, not intractable, without status migrainosus: Secondary | ICD-10-CM | POA: Diagnosis not present

## 2021-07-06 DIAGNOSIS — G43719 Chronic migraine without aura, intractable, without status migrainosus: Secondary | ICD-10-CM | POA: Diagnosis not present

## 2021-07-06 DIAGNOSIS — R42 Dizziness and giddiness: Secondary | ICD-10-CM | POA: Diagnosis not present

## 2021-07-15 ENCOUNTER — Other Ambulatory Visit: Payer: Self-pay

## 2021-07-15 ENCOUNTER — Encounter: Payer: Self-pay | Admitting: Family Medicine

## 2021-07-15 ENCOUNTER — Other Ambulatory Visit: Payer: Self-pay | Admitting: Family Medicine

## 2021-07-15 ENCOUNTER — Encounter: Payer: Self-pay | Admitting: Physician Assistant

## 2021-07-15 ENCOUNTER — Ambulatory Visit (INDEPENDENT_AMBULATORY_CARE_PROVIDER_SITE_OTHER): Payer: PPO | Admitting: Family Medicine

## 2021-07-15 VITALS — BP 115/77 | HR 66 | Temp 98.7°F | Wt 116.5 lb

## 2021-07-15 DIAGNOSIS — Z79890 Hormone replacement therapy (postmenopausal): Secondary | ICD-10-CM | POA: Insufficient documentation

## 2021-07-15 DIAGNOSIS — Z1231 Encounter for screening mammogram for malignant neoplasm of breast: Secondary | ICD-10-CM | POA: Diagnosis not present

## 2021-07-15 DIAGNOSIS — Z23 Encounter for immunization: Secondary | ICD-10-CM | POA: Diagnosis not present

## 2021-07-15 DIAGNOSIS — R4189 Other symptoms and signs involving cognitive functions and awareness: Secondary | ICD-10-CM | POA: Diagnosis not present

## 2021-07-15 DIAGNOSIS — G43009 Migraine without aura, not intractable, without status migrainosus: Secondary | ICD-10-CM | POA: Diagnosis not present

## 2021-07-15 DIAGNOSIS — M19041 Primary osteoarthritis, right hand: Secondary | ICD-10-CM | POA: Diagnosis not present

## 2021-07-15 DIAGNOSIS — F419 Anxiety disorder, unspecified: Secondary | ICD-10-CM

## 2021-07-15 DIAGNOSIS — Z1211 Encounter for screening for malignant neoplasm of colon: Secondary | ICD-10-CM | POA: Diagnosis not present

## 2021-07-15 MED ORDER — MEDROXYPROGESTERONE ACETATE 2.5 MG PO TABS: 2.5000 mg | ORAL_TABLET | Freq: Every day | ORAL | 3 refills | Status: DC

## 2021-07-15 MED ORDER — SERTRALINE HCL 50 MG PO TABS: 50.0000 mg | ORAL_TABLET | Freq: Every day | ORAL | 3 refills | Status: DC

## 2021-07-15 MED ORDER — ESTRADIOL 1 MG PO TABS: 1.0000 mg | ORAL_TABLET | Freq: Every day | ORAL | 3 refills | Status: DC

## 2021-07-15 MED ORDER — MELOXICAM 15 MG PO TABS: 15.0000 mg | ORAL_TABLET | Freq: Every day | ORAL | 0 refills | Status: DC

## 2021-07-15 MED ORDER — OMEPRAZOLE 40 MG PO CPDR: 40.0000 mg | DELAYED_RELEASE_CAPSULE | Freq: Every day | ORAL | 3 refills | Status: DC

## 2021-07-15 NOTE — Progress Notes (Signed)
Complete physical exam   Patient: Brooke Mcdowell   DOB: [birth year]   70 y.o. Female  MRN: [id number] Visit Date: 07/15/2021  Today's healthcare provider: Gwyneth Sprout, FNP   Chief Complaint  Patient presents with   Annual Exam   UTD on dentist and eye dr appts  Subjective    HPI  Brooke Mcdowell is a 70 y.o. female who presents today for a complete physical exam.  She reports consuming a general diet. Home exercise routine includes treadmill, 2-3 times/week for 30 mins or hula hooping. She generally feels poorly. She reports sleeping poorly. She does have additional problems to discuss today including brain fog.  Last Reported Mammogram - ordered 07/14/20 OVERDUE  BMD- 10/30/17 Colonoscopy- 11/01/17   Past Medical History:  Diagnosis Date   Ankle pain, left    previous strain   Arthritis    hands and back   Arthropathy of hand    Dysthymic disorder    Esophageal reflux    History of kidney stones    Insomnia    Lumbar herniated disc    Gets injections in back 3 times a year   Migraine    almost evrery day   Osteoporosis    Panic disorder    Pure hypercholesterolemia    Vertigo    with sinus infection   Vitamin D deficiency    Past Surgical History:  Procedure Laterality Date   CATARACT EXTRACTION W/PHACO Right 12/06/2018   Procedure: CATARACT EXTRACTION PHACO AND INTRAOCULAR LENS PLACEMENT (IOC)-RIGHT;  Surgeon: Marchia Meiers, MD;  Location: [facility] ORS;  Service: Ophthalmology;  Laterality: Right;  [place] 01:28.9 CDE 11.98 Fluid Pack Lot # [id number]   CATARACT EXTRACTION W/PHACO Left 09/15/2020   Procedure: CATARACT EXTRACTION PHACO AND INTRAOCULAR LENS PLACEMENT (IOC) LEFT 8.63 00:52.2;  Surgeon: Birder Robson, MD;  Location: [place];  Service: Ophthalmology;  Laterality: Left;   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 Pryor Ochoa   Social History   Socioeconomic History   Marital status: Divorced    Spouse name: Not on file   Number of children: 2   Years of education: Not on file   Highest education level: Some college, no degree  Occupational History   Occupation: retired  Tobacco Use   Smoking status: Never   Smokeless tobacco: Never  Vaping Use   Vaping Use: Never used  Substance and Sexual Activity   Alcohol use: Yes    Comment: rarely - 1 drinks    Drug use: No   Sexual activity: Not on file  Other Topics Concern   Not on file  Social History Narrative   Not on file   Social Determinants of Health   Financial Resource Strain: Not on file  Food Insecurity: Not on file  Transportation Needs: Not on file  Physical Activity: Not on file  Stress: Not on file  Social Connections: Not on file  Intimate Partner Violence: Not on file   Family Status  Relation Name Status   Mother  Deceased   Father  Deceased   Sister  Alive   Brother half Alive   Family History  Problem Relation Age of Onset   Brain cancer Mother    Cancer Mother  lung   Cancer Father        lung    Brain cancer Father    Allergies  Allergen Reactions   Gabapentin Other (See Comments)    Caused migraines   Latex Itching and Other (See Comments)    Bandaids, redness   Tetracyclines & Related Itching and Nausea And Vomiting   Sulfa Antibiotics Rash    Patient Care Team: Gwyneth Sprout, FNP as PCP - General (Family Medicine) Sharlet Salina, MD as Referring Physician (Physical Medicine and Rehabilitation) Marchia Meiers, MD as Consulting Physician (Ophthalmology) Erby Pian, MD as Referring Physician (Specialist) Vladimir Crofts, MD as Consulting Physician (Neurology) Dasher, Rayvon Char, MD (Dermatology)   Medications: Outpatient Medications Prior to Visit  Medication Sig   cyanocobalamin (,VITAMIN B-12,) 1000 MCG/ML injection Inject 1 ml into muscle once a week for 4  weeks then once a month for 4 months.   EMGALITY 120 MG/ML SOAJ SMARTSIG:1 Milliliter(s) SUB-Q Every 4 Weeks   EQ GENTLE LUBRICANT 0.3 % SOLN Place 1 drop into both eyes at bedtime as needed (dry/irritated eyes.).   ibuprofen (ADVIL) 200 MG tablet Take 200 mg by mouth every 6 (six) hours as needed.   Magnesium 250 MG TABS Take 500 mg by mouth every evening.    Multiple Vitamins-Minerals (ADULT GUMMY PO) Take 2 tablets by mouth every evening. Vitafusion MultiVites Gummy   naratriptan (AMERGE) 2.5 MG tablet Take by mouth.   rizatriptan (MAXALT) 10 MG tablet Take 1 tablet for migraine. May repeat in 2 hours if needed. Maximum 30 mg daily.   rosuvastatin (CRESTOR) 10 MG tablet TAKE 1 TABLET(10 MG) BY MOUTH DAILY   zolpidem (AMBIEN) 10 MG tablet TAKE 1 TABLET(10 MG) BY MOUTH AT BEDTIME AS NEEDED FOR SLEEP   [DISCONTINUED] estradiol (ESTRACE) 1 MG tablet TAKE 1 TABLET BY MOUTH DAILY   [DISCONTINUED] medroxyPROGESTERone (PROVERA) 2.5 MG tablet TAKE 1 TABLET(2.5 MG) BY MOUTH DAILY   [DISCONTINUED] meloxicam (MOBIC) 15 MG tablet    [DISCONTINUED] omeprazole (PRILOSEC) 40 MG capsule Take 1 capsule (40 mg total) by mouth daily.   [DISCONTINUED] sertraline (ZOLOFT) 50 MG tablet Take 1 tablet (50 mg total) by mouth at bedtime.   [DISCONTINUED] acetaminophen (TYLENOL) 325 MG tablet Take 650 mg by mouth every 6 (six) hours as needed.   [DISCONTINUED] ALPRAZolam (XANAX XR) 2 MG 24 hr tablet Take 2 mg by mouth every morning.   [DISCONTINUED] amoxicillin-clavulanate (AUGMENTIN) 875-125 MG tablet Take 1 tablet by mouth 2 (two) times daily.   [DISCONTINUED] ASPIRIN-ACETAMINOPHEN PO Take by mouth as needed.    [DISCONTINUED] chlorpheniramine-HYDROcodone (TUSSIONEX PENNKINETIC ER) 10-8 MG/5ML SUER Take 5 mLs by mouth every 12 (twelve) hours as needed for cough (will cause drowsiness.).   [DISCONTINUED] FLUZONE HIGH-DOSE QUADRIVALENT 0.7 ML SUSY    [DISCONTINUED] predniSONE (STERAPRED UNI-PAK 21 TAB) 10 MG (21) TBPK  tablet PO: Take 6 tablets on day 1:Take 5 tablets day 2:Take 4 tablets day 3: Take 3 tablets day 4:Take 2 tablets day five: 5 Take 1 tablet day 6   No facility-administered medications prior to visit.    Review of Systems  Constitutional:  Positive for fatigue.  Eyes:  Positive for photophobia and pain.  Neurological:  Positive for dizziness and headaches.  All other systems reviewed and are negative.    Objective    BP 115/77   Pulse 66   Temp 98.7 F (37.1 C) (Oral)   Wt 116 lb 8 oz (52.8 kg)   BMI 22.01  kg/m    Physical Exam Vitals and nursing note reviewed.  Constitutional:      General: She is awake. She is not in acute distress.    Appearance: Normal appearance. She is well-developed, well-groomed and normal weight. She is not ill-appearing, toxic-appearing or diaphoretic.  HENT:     Head: Normocephalic and atraumatic.     Jaw: There is normal jaw occlusion. No trismus, tenderness, swelling or pain on movement.     Right Ear: Hearing, tympanic membrane, ear canal and external ear normal. There is no impacted cerumen.     Left Ear: Hearing, tympanic membrane, ear canal and external ear normal. There is no impacted cerumen.     Nose: No congestion or rhinorrhea.     Right Turbinates: Not enlarged, swollen or pale.     Left Turbinates: Not enlarged, swollen or pale.     Right Sinus: Maxillary sinus tenderness and frontal sinus tenderness present.     Left Sinus: Maxillary sinus tenderness and frontal sinus tenderness present.     Mouth/Throat:     Lips: Pink.     Mouth: Mucous membranes are moist. No injury.     Tongue: No lesions.     Pharynx: Oropharynx is clear. Uvula midline. No pharyngeal swelling, oropharyngeal exudate, posterior oropharyngeal erythema or uvula swelling.     Tonsils: No tonsillar exudate or tonsillar abscesses.  Eyes:     General: Lids are normal. Lids are everted, no foreign bodies appreciated. Vision grossly intact. Gaze aligned appropriately.  No allergic shiner or visual field deficit.       Right eye: No discharge.        Left eye: No discharge.     Extraocular Movements: Extraocular movements intact.     Conjunctiva/sclera: Conjunctivae normal.     Right eye: Right conjunctiva is not injected. No exudate.    Left eye: Left conjunctiva is not injected. No exudate.    Pupils: Pupils are equal, round, and reactive to light.  Neck:     Thyroid: No thyroid mass, thyromegaly or thyroid tenderness.     Vascular: No carotid bruit.     Trachea: Trachea normal.  Cardiovascular:     Rate and Rhythm: Normal rate and regular rhythm.     Pulses: Normal pulses.          Carotid pulses are 2+ on the right side and 2+ on the left side.      Radial pulses are 2+ on the right side and 2+ on the left side.       Dorsalis pedis pulses are 2+ on the right side and 2+ on the left side.       Posterior tibial pulses are 2+ on the right side and 2+ on the left side.     Heart sounds: Normal heart sounds, S1 normal and S2 normal. No murmur heard.   No friction rub. No gallop.  Pulmonary:     Effort: Pulmonary effort is normal. No respiratory distress.     Breath sounds: Normal breath sounds and air entry. No stridor. No wheezing, rhonchi or rales.  Chest:     Chest wall: No tenderness.     Comments: Breast exam deferred; discussed 'know your lemons' campaign and self exam Abdominal:     General: Abdomen is flat. Bowel sounds are normal. There is no distension.     Palpations: Abdomen is soft. There is no mass.     Tenderness: There is no abdominal tenderness. There is no  right CVA tenderness, left CVA tenderness, guarding or rebound.     Hernia: No hernia is present.  Genitourinary:    Comments: Exam deferred; denies complaints Musculoskeletal:        General: No swelling, tenderness, deformity or signs of injury. Normal range of motion.     Cervical back: Full passive range of motion without pain, normal range of motion and neck supple. No  edema, rigidity or tenderness. No muscular tenderness.     Right lower leg: No edema.     Left lower leg: No edema.  Lymphadenopathy:     Cervical: No cervical adenopathy.     Right cervical: No superficial, deep or posterior cervical adenopathy.    Left cervical: No superficial, deep or posterior cervical adenopathy.  Skin:    General: Skin is warm and dry.     Capillary Refill: Capillary refill takes less than 2 seconds.     Coloration: Skin is not jaundiced or pale.     Findings: No bruising, erythema, lesion or rash.  Neurological:     General: No focal deficit present.     Mental Status: She is alert and oriented to person, place, and time. Mental status is at baseline.     GCS: GCS eye subscore is 4. GCS verbal subscore is 5. GCS motor subscore is 6.     Sensory: Sensation is intact. No sensory deficit.     Motor: Motor function is intact. No weakness.     Coordination: Coordination is intact. Coordination normal.     Gait: Gait is intact. Gait normal.  Psychiatric:        Attention and Perception: Attention and perception normal.        Mood and Affect: Affect normal. Mood is anxious and depressed.        Speech: Speech normal.        Behavior: Behavior normal. Behavior is cooperative.        Thought Content: Thought content normal.        Cognition and Memory: Cognition normal. Memory is impaired.        Judgment: Judgment normal.    Last depression screening scores PHQ 2/9 Scores 07/15/2021 07/14/2020 06/22/2020  PHQ - 2 Score 0 0 0  PHQ- 9 Score 4 1 -   Last fall risk screening Fall Risk  07/15/2021  Falls in the past year? 0  Number falls in past yr: 0  Injury with Fall? 0  Follow up -   Last Audit-C alcohol use screening Alcohol Use Disorder Test (AUDIT) 07/15/2021  1. How often do you have a drink containing alcohol? 1  2. How many drinks containing alcohol do you have on a typical day when you are drinking? 0  3. How often do you have six or more drinks on  one occasion? 0  AUDIT-C Score 1  Alcohol Brief Interventions/Follow-up -   A score of 3 or more in women, and 4 or more in men indicates increased risk for alcohol abuse, EXCEPT if all of the points are from question 1   No results found for any visits on 07/15/21.  Assessment & Plan    Routine Health Maintenance and Physical Exam  Exercise Activities and Dietary recommendations  Goals      Increase water intake     Recommend increasing water intake to 4-6 glasses a day.         Immunization History  Administered Date(s) Administered   Fluad Quad(high Dose 65+) 07/06/2020, 07/15/2021  Influenza, High Dose Seasonal PF 07/20/2017, 06/07/2019   Influenza-Unspecified 06/29/2018   Moderna Sars-Covid-2 Vaccination 11/14/2019, 12/12/2019   Pneumococcal Conjugate-13 06/13/2017   Pneumococcal Polysaccharide-23 07/03/2018   Tdap 03/17/2017   Zoster, Live 04/26/2013    Health Maintenance  Topic Date Due   Zoster Vaccines- Shingrix (1 of 2) Never done   COVID-19 Vaccine (3 - Booster for Moderna series) 05/13/2020   COLONOSCOPY (Pts 45-35yrs Insurance coverage will need to be confirmed)  12/19/2020   MAMMOGRAM  10/15/2021   DEXA SCAN  10/30/2022   TETANUS/TDAP  03/18/2027   INFLUENZA VACCINE  Completed   Hepatitis C Screening  Completed   HPV VACCINES  Aged Out    Discussed health benefits of physical activity, and encouraged her to engage in regular exercise appropriate for her age and condition.  Problem List Items Addressed This Visit       Cardiovascular and Mediastinum   Migraine    Pulled for medication refill      Relevant Medications   sertraline (ZOLOFT) 50 MG tablet   meloxicam (MOBIC) 15 MG tablet     Musculoskeletal and Integument   Primary osteoarthritis of right hand    Pulled for medication refill      Relevant Medications   meloxicam (MOBIC) 15 MG tablet     Other   Anxiety    Pulled for medication refill      Relevant Medications    sertraline (ZOLOFT) 50 MG tablet   Other Relevant Orders   AMB Referral to [place]   Screening for colon cancer - Primary    Denies concerns; due for colon cancer screening      Relevant Orders   Ambulatory referral to Gastroenterology   Encounter for screening mammogram for malignant neoplasm of breast    Denies concerns; due for mammo      Hormone replacement therapy (postmenopausal)    Feels ill of HRT- refills provided; Identified risk for prolonged use      Relevant Medications   medroxyPROGESTERone (PROVERA) 2.5 MG tablet   estradiol (ESTRACE) 1 MG tablet   Flu vaccine need    Given today; consent verified      Relevant Orders   Flu Vaccine QUAD High Dose(Fluad) (Completed)   Other Visit Diagnoses     Brain fog       Relevant Orders   AMB Referral to [place]        Return in about 6 months (around 01/13/2022) for chonic disease management.     Vonna Kotyk, FNP, have reviewed all documentation for this visit. The documentation on 07/15/21 for the exam, diagnosis, procedures, and orders are all accurate and complete.    Gwyneth Sprout, [place] [phone] (phone) [phone] (fax)  [place]

## 2021-07-15 NOTE — Assessment & Plan Note (Signed)
Denies concerns; due for mammo

## 2021-07-15 NOTE — Assessment & Plan Note (Signed)
Given today; consent verified

## 2021-07-15 NOTE — Assessment & Plan Note (Signed)
Pulled for medication refill 

## 2021-07-15 NOTE — Assessment & Plan Note (Signed)
Denies concerns; due for colon cancer screening

## 2021-07-15 NOTE — Assessment & Plan Note (Signed)
Feels ill of HRT- refills provided; Identified risk for prolonged use

## 2021-07-16 ENCOUNTER — Other Ambulatory Visit: Payer: Self-pay

## 2021-07-16 ENCOUNTER — Telehealth: Payer: Self-pay | Admitting: *Deleted

## 2021-07-16 DIAGNOSIS — Z1211 Encounter for screening for malignant neoplasm of colon: Secondary | ICD-10-CM

## 2021-07-16 MED ORDER — NA SULFATE-K SULFATE-MG SULF 17.5-3.13-1.6 GM/177ML PO SOLN: 1.0000 | Freq: Once | ORAL | 0 refills | Status: AC

## 2021-07-16 NOTE — Chronic Care Management (AMB) (Signed)
  Chronic Care Management   Note  07/16/2021 Name: ROCKELLE HEUERMAN MRN: [id number] DOB: 08/07/1951  ZANDRA LAJEUNESSE is a 70 y.o. year old female who is a primary care patient of Gwyneth Sprout, FNP. I reached out to [name] by phone today in response to a referral sent by Ms. Dundee PCP.  Ms. Quashie was given information about Chronic Care Management services today including:  CCM service includes personalized support from designated clinical staff supervised by her physician, including individualized plan of care and coordination with other care providers 24/7 contact phone numbers for assistance for urgent and routine care needs. Service will only be billed when office clinical staff spend 20 minutes or more in a month to coordinate care. Only one practitioner may furnish and bill the service in a calendar month. The patient may stop CCM services at any time (effective at the end of the month) by phone call to the office staff. The patient is responsible for co-pay (up to 20% after annual deductible is met) if co-pay is required by the individual health plan.   Patient did not agree to enrollment in care management services and does not wish to consider at this time.  Follow up plan: Patient declines further follow up and engagement by the care management team. Appropriate care team members and provider have been notified via electronic communication.   Julian Hy, [place] [facility]  Direct Dial: [phone]

## 2021-07-16 NOTE — Progress Notes (Signed)
Gastroenterology Pre-Procedure Review  Request Date: 07/22/21 Requesting Physician: Dr. Vicente Males  PATIENT REVIEW QUESTIONS: The patient responded to the following health history questions as indicated:    1. Are you having any GI issues? no 2. Do you have a personal history of Polyps? no 3. Do you have a family history of Colon Cancer or Polyps? no 4. Diabetes Mellitus? no 5. Joint replacements in the past 12 months?no 6. Major health problems in the past 3 months?no 7. Any artificial heart valves, MVP, or defibrillator?no    MEDICATIONS & ALLERGIES:    Patient reports the following regarding taking any anticoagulation/antiplatelet therapy:   Plavix, Coumadin, Eliquis, Xarelto, Lovenox, Pradaxa, Brilinta, or Effient? no Aspirin? no  Patient confirms/reports the following medications:  Current Outpatient Medications  Medication Sig Dispense Refill   cyanocobalamin (,VITAMIN B-12,) 1000 MCG/ML injection Inject 1 ml into muscle once a week for 4 weeks then once a month for 4 months.     EMGALITY 120 MG/ML SOAJ SMARTSIG:1 Milliliter(s) SUB-Q Every 4 Weeks     EQ GENTLE LUBRICANT 0.3 % SOLN Place 1 drop into both eyes at bedtime as needed (dry/irritated eyes.).     estradiol (ESTRACE) 1 MG tablet Take 1 tablet (1 mg total) by mouth daily. 90 tablet 3   ibuprofen (ADVIL) 200 MG tablet Take 200 mg by mouth every 6 (six) hours as needed.     Magnesium 250 MG TABS Take 500 mg by mouth every evening.      medroxyPROGESTERone (PROVERA) 2.5 MG tablet Take 1 tablet (2.5 mg total) by mouth daily. 90 tablet 3   meloxicam (MOBIC) 15 MG tablet Take 1 tablet (15 mg total) by mouth daily. 90 tablet 0   Multiple Vitamins-Minerals (ADULT GUMMY PO) Take 2 tablets by mouth every evening. Vitafusion MultiVites Gummy     naratriptan (AMERGE) 2.5 MG tablet Take by mouth.     omeprazole (PRILOSEC) 40 MG capsule Take 1 capsule (40 mg total) by mouth daily. 90 capsule 3   rizatriptan (MAXALT) 10 MG tablet Take 1  tablet for migraine. May repeat in 2 hours if needed. Maximum 30 mg daily. 10 tablet 3   rosuvastatin (CRESTOR) 10 MG tablet TAKE 1 TABLET(10 MG) BY MOUTH DAILY 90 tablet 0   sertraline (ZOLOFT) 50 MG tablet Take 1 tablet (50 mg total) by mouth at bedtime. 90 tablet 3   zolpidem (AMBIEN) 10 MG tablet TAKE 1 TABLET(10 MG) BY MOUTH AT BEDTIME AS NEEDED FOR SLEEP 30 tablet 3   No current facility-administered medications for this visit.    Patient confirms/reports the following allergies:  Allergies  Allergen Reactions   Gabapentin Other (See Comments)    Caused migraines   Latex Itching and Other (See Comments)    Bandaids, redness   Tetracyclines & Related Itching and Nausea And Vomiting   Sulfa Antibiotics Rash    No orders of the defined types were placed in this encounter.   AUTHORIZATION INFORMATION Primary Insurance: 1D#: Group #:  Secondary Insurance: 1D#: Group #:  SCHEDULE INFORMATION: Date: 07/22/21 Time: Location: [facility]

## 2021-07-20 ENCOUNTER — Telehealth: Payer: Self-pay

## 2021-07-20 ENCOUNTER — Other Ambulatory Visit: Payer: Self-pay

## 2021-07-20 MED ORDER — PEG 3350-KCL-NA BICARB-NACL 420 G PO SOLR: 4000.0000 mL | Freq: Once | ORAL | 0 refills | Status: AC

## 2021-07-20 NOTE — Telephone Encounter (Signed)
Different prep was sent to pharmacy.

## 2021-07-20 NOTE — Telephone Encounter (Signed)
Pt. Calling she says her prep was too expensive. She is requesting a call back to see what other options she has

## 2021-07-20 NOTE — Progress Notes (Signed)
Cheaper prep was sent to pharmacy.

## 2021-07-21 DIAGNOSIS — M542 Cervicalgia: Secondary | ICD-10-CM | POA: Diagnosis not present

## 2021-07-21 DIAGNOSIS — M5481 Occipital neuralgia: Secondary | ICD-10-CM | POA: Diagnosis not present

## 2021-07-22 ENCOUNTER — Encounter
Admission: RE | Disposition: A | Discharge status: Home / Self Care | Payer: Self-pay | Source: Home / Self Care | Attending: Gastroenterology

## 2021-07-22 ENCOUNTER — Ambulatory Visit: Payer: PPO | Admitting: Anesthesiology

## 2021-07-22 ENCOUNTER — Ambulatory Visit
Admission: RE | Admit: 2021-07-22 | Discharge: 2021-07-22 | Disposition: A | Discharge status: Home / Self Care | Payer: PPO | Attending: Gastroenterology | Admitting: Gastroenterology

## 2021-07-22 ENCOUNTER — Encounter: Payer: Self-pay | Admitting: Gastroenterology

## 2021-07-22 DIAGNOSIS — Z808 Family history of malignant neoplasm of other organs or systems: Secondary | ICD-10-CM | POA: Insufficient documentation

## 2021-07-22 DIAGNOSIS — Z1211 Encounter for screening for malignant neoplasm of colon: Secondary | ICD-10-CM | POA: Insufficient documentation

## 2021-07-22 DIAGNOSIS — Z801 Family history of malignant neoplasm of trachea, bronchus and lung: Secondary | ICD-10-CM | POA: Diagnosis not present

## 2021-07-22 DIAGNOSIS — E7800 Pure hypercholesterolemia, unspecified: Secondary | ICD-10-CM | POA: Diagnosis not present

## 2021-07-22 HISTORY — PX: COLONOSCOPY WITH PROPOFOL: SHX5780

## 2021-07-22 SURGERY — COLONOSCOPY WITH PROPOFOL
Anesthesia: General

## 2021-07-22 MED ORDER — PHENYLEPHRINE HCL (PRESSORS) 10 MG/ML IV SOLN
INTRAVENOUS | Status: AC
  Filled 2021-07-22: qty 1

## 2021-07-22 MED ORDER — PROPOFOL 10 MG/ML IV BOLUS
INTRAVENOUS | Status: DC | PRN
  Administered 2021-07-22: 50 mg via INTRAVENOUS

## 2021-07-22 MED ORDER — SODIUM CHLORIDE 0.9 % IV SOLN: INTRAVENOUS | Status: DC

## 2021-07-22 MED ORDER — PROPOFOL 500 MG/50ML IV EMUL
INTRAVENOUS | Status: DC | PRN
  Administered 2021-07-22: 200 ug/kg/min via INTRAVENOUS

## 2021-07-22 MED ORDER — PROPOFOL 500 MG/50ML IV EMUL
INTRAVENOUS | Status: AC
  Filled 2021-07-22: qty 50

## 2021-07-22 NOTE — Anesthesia Postprocedure Evaluation (Signed)
Anesthesia Post Note  Patient: AIDE WOJNAR  Procedure(s) Performed: COLONOSCOPY WITH PROPOFOL  Patient location during evaluation: Endoscopy Anesthesia Type: General Level of consciousness: awake and alert Pain management: pain level controlled Vital Signs Assessment: post-procedure vital signs reviewed and stable Respiratory status: spontaneous breathing, nonlabored ventilation, respiratory function stable and patient connected to nasal cannula oxygen Cardiovascular status: blood pressure returned to baseline and stable Postop Assessment: no apparent nausea or vomiting Anesthetic complications: no   No notable events documented.   Last Vitals:  Vitals:   07/22/21 0819 07/22/21 0829  BP: 131/73 140/71  Pulse: 61 (!) 58  Resp: 16 18  Temp:    SpO2: 100% 100%    Last Pain:  Vitals:   07/22/21 0829  TempSrc:   PainSc: 0-No pain                 Precious Haws Omolara Carol

## 2021-07-22 NOTE — Anesthesia Preprocedure Evaluation (Signed)
Anesthesia Evaluation  Patient identified by MRN, date of birth, ID band Patient awake    Reviewed: Allergy & Precautions, NPO status , Patient's Chart, lab work & pertinent test results  History of Anesthesia Complications Negative for: history of anesthetic complications  Airway Mallampati: III  TM Distance: <3 FB Neck ROM: full    Dental  (+) Chipped   Pulmonary neg pulmonary ROS, neg shortness of breath,    Pulmonary exam normal        Cardiovascular Exercise Tolerance: Good (-) angina(-) Past MI and (-) DOE negative cardio ROS Normal cardiovascular exam     Neuro/Psych  Headaches, PSYCHIATRIC DISORDERS negative psych ROS   GI/Hepatic Neg liver ROS, GERD  Medicated and Controlled,  Endo/Other  negative endocrine ROS  Renal/GU negative Renal ROS  negative genitourinary   Musculoskeletal  (+) Arthritis ,   Abdominal   Peds  Hematology negative hematology ROS (+)   Anesthesia Other Findings Past Medical History: No date: Ankle pain, left     Comment:  previous strain No date: Arthritis     Comment:  hands and back No date: Arthropathy of hand No date: Dysthymic disorder No date: Esophageal reflux No date: History of kidney stones No date: Insomnia No date: Lumbar herniated disc     Comment:  Gets injections in back 3 times a year No date: Migraine     Comment:  almost evrery day No date: Osteoporosis No date: Panic disorder No date: Pure hypercholesterolemia No date: Vertigo     Comment:  with sinus infection No date: Vitamin D deficiency  Past Surgical History: 12/06/2018: CATARACT EXTRACTION W/PHACO; Right     Comment:  Procedure: CATARACT EXTRACTION PHACO AND INTRAOCULAR               LENS PLACEMENT (IOC)-RIGHT;  Surgeon: Marchia Meiers, MD;               Location: [facility];  Service: Ophthalmology;  Laterality:              Right;  [place] 01:28.9 CDE 11.98 Fluid Pack Lot # [id number] 09/15/2020:  CATARACT EXTRACTION W/PHACO; Left     Comment:  Procedure: CATARACT EXTRACTION PHACO AND INTRAOCULAR               LENS PLACEMENT (IOC) LEFT 8.63 00:52.2;  Surgeon:               Birder Robson, MD;  Location: [place];                Service: Ophthalmology;  Laterality: Left; No date: clot removal from right sinus after surgery to remove tumor  in 1995 1993,1994,1995: LITHOTRIPSY     Comment:  renal stones No date: TUBAL LIGATION No date: TUMOR REMOVAL     Comment:  behind right ete in sinuses- 1995 Dr. Pryor Ochoa  BMI    Body Mass Index: 21.73 kg/m      Reproductive/Obstetrics negative OB ROS                             Anesthesia Physical Anesthesia Plan  ASA: 3  Anesthesia Plan: General   Post-op Pain Management:    Induction: Intravenous  PONV Risk Score and Plan: Propofol infusion and TIVA  Airway Management Planned: Natural Airway and Nasal Cannula  Additional Equipment:   Intra-op Plan:   Post-operative Plan:   Informed Consent: I have reviewed the patients History and Physical,  chart, labs and discussed the procedure including the risks, benefits and alternatives for the proposed anesthesia with the patient or authorized representative who has indicated his/her understanding and acceptance.     Dental Advisory Given  Plan Discussed with: Anesthesiologist, CRNA and Surgeon  Anesthesia Plan Comments: (Patient consented for risks of anesthesia including but not limited to:  - adverse reactions to medications - risk of airway placement if required - damage to eyes, teeth, lips or other oral mucosa - nerve damage due to positioning  - sore throat or hoarseness - Damage to heart, brain, nerves, lungs, other parts of body or loss of life  Patient voiced understanding.)        Anesthesia Quick Evaluation

## 2021-07-22 NOTE — Op Note (Signed)
[facility] Gastroenterology Patient Name: Brooke Mcdowell Procedure Date: 07/22/2021 7:35 AM MRN: [id number] Account #: [id number] Date of Birth: [birth year] Admit Type: Outpatient Age: 70 Room: [facility] ENDO ROOM 3 Gender: Female Note Status: Finalized Instrument Name: Jasper Riling [id number] Procedure:             Colonoscopy Indications:           Screening for colorectal malignant neoplasm Providers:             Jonathon Bellows MD, MD Referring MD:          Edison Nasuti Medicines:             Monitored Anesthesia Care Complications:         No immediate complications. Procedure:             Pre-Anesthesia Assessment:                        - Prior to the procedure, a History and Physical was                         performed, and patient medications, allergies and                         sensitivities were reviewed. The patient's tolerance                         of previous anesthesia was reviewed.                        - The risks and benefits of the procedure and the                         sedation options and risks were discussed with the                         patient. All questions were answered and informed                         consent was obtained.                        - ASA Grade Assessment: II - A patient with mild                         systemic disease.                        After obtaining informed consent, the colonoscope was                         passed under direct vision. Throughout the procedure,                         the patient's blood pressure, pulse, and oxygen                         saturations were monitored continuously. The                         Colonoscope was introduced through the  anus and                         advanced to the the cecum, identified by the                         appendiceal orifice. The colonoscopy was performed                         with ease. The patient tolerated the procedure well.                          The quality of the bowel preparation was excellent. Findings:      The perianal and digital rectal examinations were normal.      The entire examined colon appeared normal on direct and retroflexion       views. Impression:            - The entire examined colon is normal on direct and                         retroflexion views.                        - No specimens collected. Recommendation:        - Discharge patient to home.                        - Resume previous diet.                        - Continue present medications.                        - Repeat colonoscopy is not recommended due to current                         age (58 years or older) for screening purposes. Procedure Code(s):     --- Professional ---                        [phone], Colonoscopy, flexible; diagnostic, including                         collection of specimen(s) by brushing or washing, when                         performed (separate procedure) Diagnosis Code(s):     --- Professional ---                        Z12.11, Encounter for screening for malignant neoplasm                         of colon CPT copyright 2019 American Medical Association. All rights reserved. The codes documented in this report are preliminary and upon coder review may  be revised to meet current compliance requirements. Jonathon Bellows, MD Jonathon Bellows MD, MD 07/22/2021 8:07:15 AM This report has been signed electronically. Number of Addenda: 0 Note Initiated On: 07/22/2021 7:35 AM Scope Withdrawal Time: 0 hours 11 minutes 44 seconds  Total Procedure  Duration: 0 hours 15 minutes 24 seconds  Estimated Blood Loss:  Estimated blood loss: none.      [facility]

## 2021-07-22 NOTE — Transfer of Care (Signed)
Immediate Anesthesia Transfer of Care Note  Patient: Brooke Mcdowell  Procedure(s) Performed: COLONOSCOPY WITH PROPOFOL  Patient Location: PACU  Anesthesia Type:General  Level of Consciousness: sedated  Airway & Oxygen Therapy: Patient Spontanous Breathing and Patient connected to nasal cannula oxygen  Post-op Assessment: Report given to RN and Post -op Vital signs reviewed and stable  Post vital signs: Reviewed and stable  Last Vitals:  Vitals Value Taken Time  BP 110/68 07/22/21 0809  Temp    Pulse 71 07/22/21 0809  Resp 15 07/22/21 0809  SpO2 98 % 07/22/21 0809    Last Pain:  Vitals:   07/22/21 0701  TempSrc: Temporal  PainSc: 0-No pain         Complications: No notable events documented.

## 2021-07-22 NOTE — H&P (Signed)
Jonathon Bellows, MD [address], [place], [place], [place], [address] [address], [place], [place], [place], [address] Phone: [phone]  Fax: [phone]  Primary Care Physician:  Gwyneth Sprout, FNP   Pre-Procedure History & Physical: HPI:  Brooke Mcdowell is a 70 y.o. female is here for an colonoscopy.   Past Medical History:  Diagnosis Date   Ankle pain, left    previous strain   Arthritis    hands and back   Arthropathy of hand    Dysthymic disorder    Esophageal reflux    History of kidney stones    Insomnia    Lumbar herniated disc    Gets injections in back 3 times a year   Migraine    almost evrery day   Osteoporosis    Panic disorder    Pure hypercholesterolemia    Vertigo    with sinus infection   Vitamin D deficiency     Past Surgical History:  Procedure Laterality Date   CATARACT EXTRACTION W/PHACO Right 12/06/2018   Procedure: CATARACT EXTRACTION PHACO AND INTRAOCULAR LENS PLACEMENT (IOC)-RIGHT;  Surgeon: Marchia Meiers, MD;  Location: [facility] ORS;  Service: Ophthalmology;  Laterality: Right;  [place] 01:28.9 CDE 11.98 Fluid Pack Lot # [id number]   CATARACT EXTRACTION W/PHACO Left 09/15/2020   Procedure: CATARACT EXTRACTION PHACO AND INTRAOCULAR LENS PLACEMENT (IOC) LEFT 8.63 00:52.2;  Surgeon: Birder Robson, MD;  Location: [place];  Service: Ophthalmology;  Laterality: Left;   clot removal from right sinus after surgery to remove tumor in 1995     LITHOTRIPSY  [phone]   renal stones   TUBAL LIGATION     TUMOR REMOVAL     behind right ete in sinuses- 1995 Dr. Pryor Ochoa    Prior to Admission medications   Medication Sig Start Date End Date Taking? Authorizing Provider  estradiol (ESTRACE) 1 MG tablet Take 1 tablet (1 mg total) by mouth daily. 07/15/21  Yes Tally Joe T, FNP  rosuvastatin (CRESTOR) 10 MG tablet TAKE 1 TABLET(10 MG) BY MOUTH DAILY 05/10/21  Yes Tally Joe T, FNP  sertraline (ZOLOFT) 50 MG tablet Take 1 tablet (50  mg total) by mouth at bedtime. 07/15/21  Yes Gwyneth Sprout, FNP  zolpidem (AMBIEN) 10 MG tablet TAKE 1 TABLET(10 MG) BY MOUTH AT BEDTIME AS NEEDED FOR SLEEP 05/03/21  Yes Birdie Sons, MD  cyanocobalamin (,VITAMIN B-12,) 1000 MCG/ML injection Inject 1 ml into muscle once a week for 4 weeks then once a month for 4 months. 06/25/21   [provider]  EMGALITY 120 MG/ML SOAJ SMARTSIG:1 Milliliter(s) SUB-Q Every 4 Weeks 10/06/20   [provider]  EQ GENTLE LUBRICANT 0.3 % SOLN Place 1 drop into both eyes at bedtime as needed (dry/irritated eyes.).    [provider]  ibuprofen (ADVIL) 200 MG tablet Take 200 mg by mouth every 6 (six) hours as needed.    [provider]  Magnesium 250 MG TABS Take 500 mg by mouth every evening.     [provider]  medroxyPROGESTERone (PROVERA) 2.5 MG tablet Take 1 tablet (2.5 mg total) by mouth daily. 07/15/21   Gwyneth Sprout, FNP  meloxicam (MOBIC) 15 MG tablet Take 1 tablet (15 mg total) by mouth daily. 07/15/21   Gwyneth Sprout, FNP  Multiple Vitamins-Minerals (ADULT GUMMY PO) Take 2 tablets by mouth every evening. Vitafusion MultiVites Gummy    [provider]  naratriptan (AMERGE) 2.5 MG tablet Take  by mouth. 07/01/20   [provider]  omeprazole (PRILOSEC) 40 MG capsule Take 1 capsule (40 mg total) by mouth daily. 07/15/21   Gwyneth Sprout, FNP  rizatriptan (MAXALT) 10 MG tablet Take 1 tablet for migraine. May repeat in 2 hours if needed. Maximum 30 mg daily. 09/12/17   Trinna Post, PA-C    Allergies as of 07/16/2021 - Review Complete 07/15/2021  Allergen Reaction Noted   Gabapentin Other (See Comments) 11/01/2016   Latex Itching and Other (See Comments) 12/03/2018   Tetracyclines & related Itching and Nausea And Vomiting 08/04/2016   Sulfa antibiotics Rash 08/04/2016    Family History  Problem Relation Age of Onset   Brain cancer Mother    Cancer Mother        lung   Cancer Father         lung    Brain cancer Father     Social History   Socioeconomic History   Marital status: Divorced    Spouse name: Not on file   Number of children: 2   Years of education: Not on file   Highest education level: Some college, no degree  Occupational History   Occupation: retired  Tobacco Use   Smoking status: Never   Smokeless tobacco: Never  Vaping Use   Vaping Use: Never used  Substance and Sexual Activity   Alcohol use: Yes    Comment: rarely - 1 drinks    Drug use: No   Sexual activity: Not on file  Other Topics Concern   Not on file  Social History Narrative   Not on file   Social Determinants of Health   Financial Resource Strain: Not on file  Food Insecurity: Not on file  Transportation Needs: Not on file  Physical Activity: Not on file  Stress: Not on file  Social Connections: Not on file  Intimate Partner Violence: Not on file    Review of Systems: See HPI, otherwise negative ROS  Physical Exam: BP (!) 147/82   Pulse 66   Temp (!) 96.8 F (36 C) (Temporal)   Resp 18   Ht 5\' 1"  (1.549 m)   Wt 52.2 kg   SpO2 100%   BMI 21.73 kg/m  General:   Alert,  pleasant and cooperative in NAD Head:  Normocephalic and atraumatic. Neck:  Supple; no masses or thyromegaly. Lungs:  Clear throughout to auscultation, normal respiratory effort.    Heart:  +S1, +S2, Regular rate and rhythm, No edema. Abdomen:  Soft, nontender and nondistended. Normal bowel sounds, without guarding, and without rebound.   Neurologic:  Alert and  oriented x4;  grossly normal neurologically.  Impression/Plan: Brooke Mcdowell is here for an colonoscopy to be performed for Screening colonoscopy average risk   Risks, benefits, limitations, and alternatives regarding  colonoscopy have been reviewed with the patient.  Questions have been answered.  All parties agreeable.   Jonathon Bellows, MD  07/22/2021, 7:44 AM

## 2021-07-23 ENCOUNTER — Encounter: Payer: Self-pay | Admitting: Gastroenterology

## 2021-07-28 ENCOUNTER — Other Ambulatory Visit: Payer: Self-pay

## 2021-07-28 ENCOUNTER — Ambulatory Visit
Admission: RE | Admit: 2021-07-28 | Discharge: 2021-07-28 | Disposition: A | Discharge status: Home / Self Care | Payer: PPO | Source: Ambulatory Visit | Attending: Family Medicine | Admitting: Family Medicine

## 2021-07-28 ENCOUNTER — Other Ambulatory Visit: Payer: Self-pay | Admitting: Family Medicine

## 2021-07-28 DIAGNOSIS — Z1231 Encounter for screening mammogram for malignant neoplasm of breast: Secondary | ICD-10-CM | POA: Insufficient documentation

## 2021-07-28 DIAGNOSIS — E7800 Pure hypercholesterolemia, unspecified: Secondary | ICD-10-CM

## 2021-07-29 NOTE — Telephone Encounter (Signed)
Requested medications are due for refill today.  yes  Requested medications are on the active medications list.  yes  Last refill. 05/10/2021  Future visit scheduled.   no  Notes to clinic.  Labs are overdue. Pt last seen in office 10/23/2020.

## 2021-08-12 DIAGNOSIS — M3501 Sicca syndrome with keratoconjunctivitis: Secondary | ICD-10-CM | POA: Diagnosis not present

## 2021-09-01 ENCOUNTER — Other Ambulatory Visit: Payer: Self-pay | Admitting: Family Medicine

## 2021-09-01 DIAGNOSIS — G4700 Insomnia, unspecified: Secondary | ICD-10-CM

## 2021-09-02 NOTE — Telephone Encounter (Signed)
Refill if appropriate.  Please advise. Last filled 08/04/21.

## 2021-09-30 ENCOUNTER — Other Ambulatory Visit: Payer: Self-pay | Admitting: Family Medicine

## 2021-09-30 DIAGNOSIS — G4700 Insomnia, unspecified: Secondary | ICD-10-CM

## 2021-10-01 NOTE — Telephone Encounter (Signed)
Requested medication (s) are due for refill today: yes  Requested medication (s) are on the active medication list: yes  Last refill:  09/03/21  Future visit scheduled: no  Notes to clinic:  This medication can not be delegated, please assess.   Requested Prescriptions  Pending Prescriptions Disp Refills   zolpidem (AMBIEN) 10 MG tablet [Pharmacy Med Name: ZOLPIDEM 10MG  TABLETS] 30 tablet     Sig: TAKE 1 TABLET(10 MG) BY MOUTH AT BEDTIME AS NEEDED FOR SLEEP     Not Delegated - Psychiatry:  Anxiolytics/Hypnotics Failed - 09/30/2021 10:21 PM      Failed - This refill cannot be delegated      Failed - Urine Drug Screen completed in last 360 days      Passed - Valid encounter within last 6 months    Recent Outpatient Visits           2 months ago Screening for colon cancer   [facility] Gwyneth Sprout, FNP   11 months ago Acute non-recurrent pansinusitis   [facility] Flinchum, Kelby Aline, FNP   1 year ago Annual physical exam   [facility] Trinna Post, [place]   1 year ago Cough   [facility] Trinna Post, [place]   1 year ago Pure hypercholesterolemia   [place], [place], [place]

## 2021-10-18 DIAGNOSIS — G43719 Chronic migraine without aura, intractable, without status migrainosus: Secondary | ICD-10-CM | POA: Diagnosis not present

## 2021-10-18 DIAGNOSIS — R9082 White matter disease, unspecified: Secondary | ICD-10-CM | POA: Diagnosis not present

## 2021-10-18 DIAGNOSIS — E538 Deficiency of other specified B group vitamins: Secondary | ICD-10-CM | POA: Diagnosis not present

## 2021-10-18 DIAGNOSIS — M5481 Occipital neuralgia: Secondary | ICD-10-CM | POA: Diagnosis not present

## 2021-10-18 DIAGNOSIS — H8113 Benign paroxysmal vertigo, bilateral: Secondary | ICD-10-CM | POA: Diagnosis not present

## 2021-10-21 DIAGNOSIS — L570 Actinic keratosis: Secondary | ICD-10-CM | POA: Diagnosis not present

## 2021-10-21 DIAGNOSIS — Z85828 Personal history of other malignant neoplasm of skin: Secondary | ICD-10-CM | POA: Diagnosis not present

## 2021-10-21 DIAGNOSIS — X32XXXA Exposure to sunlight, initial encounter: Secondary | ICD-10-CM | POA: Diagnosis not present

## 2021-10-21 DIAGNOSIS — L538 Other specified erythematous conditions: Secondary | ICD-10-CM | POA: Diagnosis not present

## 2021-10-21 DIAGNOSIS — D2271 Melanocytic nevi of right lower limb, including hip: Secondary | ICD-10-CM | POA: Diagnosis not present

## 2021-10-21 DIAGNOSIS — D225 Melanocytic nevi of trunk: Secondary | ICD-10-CM | POA: Diagnosis not present

## 2021-10-21 DIAGNOSIS — D2261 Melanocytic nevi of right upper limb, including shoulder: Secondary | ICD-10-CM | POA: Diagnosis not present

## 2021-10-21 DIAGNOSIS — D2262 Melanocytic nevi of left upper limb, including shoulder: Secondary | ICD-10-CM | POA: Diagnosis not present

## 2021-10-21 DIAGNOSIS — L820 Inflamed seborrheic keratosis: Secondary | ICD-10-CM | POA: Diagnosis not present

## 2021-10-28 ENCOUNTER — Other Ambulatory Visit: Payer: Self-pay | Admitting: Family Medicine

## 2021-10-28 DIAGNOSIS — E7800 Pure hypercholesterolemia, unspecified: Secondary | ICD-10-CM

## 2021-10-29 ENCOUNTER — Other Ambulatory Visit: Payer: Self-pay | Admitting: Family Medicine

## 2021-10-29 DIAGNOSIS — M19041 Primary osteoarthritis, right hand: Secondary | ICD-10-CM

## 2021-10-29 NOTE — Telephone Encounter (Signed)
Requested medications are due for refill today.  yes  Requested medications are on the active medications list.  yes  Last refill. 07/15/2021  Future visit scheduled.   no  Notes to clinic.  Filed protocol D/t expired labs.    Requested Prescriptions  Pending Prescriptions Disp Refills   meloxicam (MOBIC) 15 MG tablet [Pharmacy Med Name: MELOXICAM 15MG  TABLETS] 90 tablet 0    Sig: TAKE 1 TABLET(15 MG) BY MOUTH DAILY     Analgesics:  COX2 Inhibitors Failed - 10/29/2021 10:52 PM      Failed - HGB in normal range and within 360 days    Hemoglobin  Date Value Ref Range Status  07/16/2020 13.8 11.1 - 15.9 g/dL Final          Failed - Cr in normal range and within 360 days    Creatinine, Ser  Date Value Ref Range Status  07/16/2020 0.76 0.57 - 1.00 mg/dL Final          Passed - Patient is not pregnant      Passed - Valid encounter within last 12 months    Recent Outpatient Visits           3 months ago Screening for colon cancer   [facility] Gwyneth Sprout, FNP   1 year ago Acute non-recurrent pansinusitis   [facility] Flinchum, Kelby Aline, FNP   1 year ago Annual physical exam   [facility] Trinna Post, [place]   1 year ago Cough   [facility] Trinna Post, [place]   1 year ago Pure hypercholesterolemia   [place], [place], [place]

## 2021-12-13 ENCOUNTER — Ambulatory Visit: Payer: Self-pay | Admitting: *Deleted

## 2021-12-13 NOTE — Telephone Encounter (Signed)
?  Summary: sinus discomfort / rx req  ? The patient has experienced sinus discomfort for roughly a week  ? ?The patient shares that they've been experiencing an elevated temperature, sore throat and drainage  ? ?The patient would like to be prescribed something for their symptoms  ? ?Please contact further when available   ?  ? ? ? ?Chief Complaint: requesting medication for URI ?Symptoms: sinus discomfort, fever 99.4 sore throat, cough, yellow/gray mucus. C/o right ear discomfort. ?Frequency: x 1 week  ?Pertinent Negatives: Patient denies covid , tested negative 12/10/21. Denies chest pain, difficulty breathing. ?Disposition: '[]'$ ED /'[]'$ Urgent Care (no appt availability in office) / '[x]'$ Appointment(In office/virtual)/ '[]'$  [facility]/ '[]'$ Home Care/ '[]'$ Refused Recommended Disposition /'[]'$ [facility]/ '[]'$  Follow-up with PCP ?Additional Notes:  ? ?Appt scheduled for 12/14/21. Requesting medication for sx.  ? ?Reason for Disposition ? [1] Sinus pain (not just congestion) AND [2] fever ? ?Answer Assessment - Initial Assessment Questions ?1. LOCATION: "Where does it hurt?"  ?    Sinus discomfort, sore throat, feels in chest now  ?2. ONSET: "When did the sinus pain start?"  (e.g., hours, days)  ?    1 week ago  ?3. SEVERITY: "How bad is the pain?"   (Scale 1-10; mild, moderate or severe) ?  - MILD (1-3): doesn't interfere with normal activities  ?  - MODERATE (4-7): interferes with normal activities (e.g., work or school) or awakens from sleep ?  - SEVERE (8-10): excruciating pain and patient unable to do any normal activities    ?    na ?4. RECURRENT SYMPTOM: "Have you ever had sinus problems before?" If Yes, ask: "When was the last time?" and "What happened that time?"  ?    Na  ?5. NASAL CONGESTION: "Is the nose blocked?" If Yes, ask: "Can you open it or must you breathe through your mouth?" ?    Yes some. Already use Flonase and empty  and now using Afrin ?6. NASAL DISCHARGE: "Do you have discharge from  your nose?" If so ask, "What color?" ?    na ?7. FEVER: "Do you have a fever?" If Yes, ask: "What is it, how was it measured, and when did it start?"  ?    99.4 ?8. OTHER SYMPTOMS: "Do you have any other symptoms?" (e.g., sore throat, cough, earache, difficulty breathing) ?    Sore throat, cough ,  not clear, not green , yellow to gray in color. Stuffy nose  tested negative for covid 12/10/21 ?9. PREGNANCY: "Is there any chance you are pregnant?" "When was your last menstrual period?" ?    na ? ?Protocols used: Sinus Pain or Congestion-A-AH ? ?

## 2021-12-14 ENCOUNTER — Other Ambulatory Visit: Payer: Self-pay

## 2021-12-14 ENCOUNTER — Other Ambulatory Visit: Payer: Self-pay | Admitting: Family Medicine

## 2021-12-14 ENCOUNTER — Ambulatory Visit (INDEPENDENT_AMBULATORY_CARE_PROVIDER_SITE_OTHER): Payer: PPO

## 2021-12-14 ENCOUNTER — Telehealth (INDEPENDENT_AMBULATORY_CARE_PROVIDER_SITE_OTHER): Payer: PPO | Admitting: Family Medicine

## 2021-12-14 ENCOUNTER — Encounter: Payer: Self-pay | Admitting: Family Medicine

## 2021-12-14 VITALS — Temp 99.2°F

## 2021-12-14 VITALS — Ht 61.0 in | Wt 115.0 lb

## 2021-12-14 DIAGNOSIS — Z780 Asymptomatic menopausal state: Secondary | ICD-10-CM

## 2021-12-14 DIAGNOSIS — B9689 Other specified bacterial agents as the cause of diseases classified elsewhere: Secondary | ICD-10-CM | POA: Diagnosis not present

## 2021-12-14 DIAGNOSIS — J329 Chronic sinusitis, unspecified: Secondary | ICD-10-CM | POA: Diagnosis not present

## 2021-12-14 DIAGNOSIS — Z0000 Encounter for general adult medical examination without abnormal findings: Secondary | ICD-10-CM | POA: Diagnosis not present

## 2021-12-14 MED ORDER — FLUTICASONE PROPIONATE 50 MCG/ACT NA SUSP: 2.0000 | Freq: Every day | NASAL | 6 refills | Status: AC

## 2021-12-14 MED ORDER — PREDNISONE 10 MG (21) PO TBPK: ORAL_TABLET | ORAL | 0 refills | Status: DC

## 2021-12-14 MED ORDER — AMOXICILLIN-POT CLAVULANATE 875-125 MG PO TABS: 1.0000 | ORAL_TABLET | Freq: Two times a day (BID) | ORAL | 0 refills | Status: DC

## 2021-12-14 NOTE — Progress Notes (Signed)
?Virtual Visit via Telephone Note ? ?I connected with  Brooke Mcdowell on 12/14/21 at  2:20 PM EDT by telephone and verified that I am speaking with the correct person using two identifiers. ? ?Location: ?Patient: home ?Provider: [facility] ?Persons participating in the virtual visit: patient/Nurse Health Advisor ?  ?I discussed the limitations, risks, security and privacy concerns of performing an evaluation and management service by telephone and the availability of in person appointments. The patient expressed understanding and agreed to proceed. ? ?Interactive audio and video telecommunications were attempted between this nurse and patient, however failed, due to patient having technical difficulties OR patient did not have access to video capability.  We continued and completed visit with audio only. ? ?Some vital signs may be absent or patient reported.  ? ?Brooke David, LPN ? ?Subjective:  ? Brooke Mcdowell is a 71 y.o. female who presents for Medicare Annual (Subsequent) preventive examination. ? ?Review of Systems    ? ?  ? ?   ?Objective:  ?  ?There were no vitals filed for this visit. ?There is no height or weight on file to calculate BMI. ? ?Advanced Directives 07/22/2021 09/15/2020 06/22/2020 06/17/2019 06/15/2018 06/13/2017  ?Does Patient Have a Medical Advance Directive? Yes Yes Yes Yes Yes Yes  ?Type of Advance Directive - Brooke Mcdowell;Living will [place];Living will Brooke Mcdowell;Living will Brooke Mcdowell;Living will Living will  ?Does patient want to make changes to medical advance directive? - Yes (MAU/Ambulatory/Procedural Areas - Information given) - - - -  ?Copy of Brooke Mcdowell in Chart? - No - copy requested No - copy requested No - copy requested No - copy requested -  ? ? ?Current Medications (verified) ?Outpatient Encounter Medications as of 12/14/2021  ?Medication Sig  ? Galcanezumab-gnlm (EMGALITY) 120 MG/ML SOAJ  Inject into the skin.  ? rizatriptan (MAXALT) 10 MG tablet Take by mouth.  ? cyanocobalamin (,VITAMIN B-12,) 1000 MCG/ML injection Inject 1 ml into muscle once a week for 4 weeks then once a month for 4 months.  ? EMGALITY 120 MG/ML SOAJ SMARTSIG:1 Milliliter(s) SUB-Q Every 4 Weeks  ? EQ GENTLE LUBRICANT 0.3 % SOLN Place 1 drop into both eyes at bedtime as needed (dry/irritated eyes.).  ? estradiol (ESTRACE) 1 MG tablet Take 1 tablet (1 mg total) by mouth daily.  ? ibuprofen (ADVIL) 200 MG tablet Take 200 mg by mouth every 6 (six) hours as needed.  ? Magnesium 250 MG TABS Take 500 mg by mouth every evening.   ? medroxyPROGESTERone (PROVERA) 2.5 MG tablet Take 1 tablet (2.5 mg total) by mouth daily.  ? meloxicam (MOBIC) 15 MG tablet TAKE 1 TABLET(15 MG) BY MOUTH DAILY (Patient not taking: Reported on 12/14/2021)  ? Multiple Vitamins-Minerals (ADULT GUMMY PO) Take 2 tablets by mouth every evening. Vitafusion MultiVites Gummy  ? Na Sulfate-K Sulfate-Mg Sulf 17.5-3.13-1.6 GM/177ML SOLN Take by mouth.  ? naratriptan (AMERGE) 2.5 MG tablet Take by mouth.  ? NURTEC 75 MG TBDP Take by mouth daily.  ? omeprazole (PRILOSEC) 40 MG capsule Take 1 capsule (40 mg total) by mouth daily.  ? polyethylene glycol-electrolytes (NULYTELY) 420 g solution Take by mouth.  ? rizatriptan (MAXALT) 10 MG tablet Take 1 tablet for migraine. May repeat in 2 hours if needed. Maximum 30 mg daily.  ? rosuvastatin (CRESTOR) 10 MG tablet TAKE 1 TABLET(10 MG) BY MOUTH DAILY  ? sertraline (ZOLOFT) 50 MG tablet Take 1 tablet (50  mg total) by mouth at bedtime.  ? zolpidem (AMBIEN) 10 MG tablet TAKE 1 TABLET(10 MG) BY MOUTH AT BEDTIME AS NEEDED FOR SLEEP  ? [DISCONTINUED] amoxicillin (AMOXIL) 875 MG tablet Take 875 mg by mouth 2 (two) times daily.  ? [DISCONTINUED] predniSONE (DELTASONE) 10 MG tablet Take by mouth. (Patient not taking: Reported on 12/14/2021)  ? ?No facility-administered encounter medications on file as of 12/14/2021.  ? ? ?Allergies  (verified) ?Gabapentin, Latex, Tetracyclines & related, and Sulfa antibiotics  ? ?History: ?Past Medical History:  ?Diagnosis Date  ? Ankle pain, left   ? previous strain  ? Arthritis   ? hands and back  ? Arthropathy of hand   ? Dysthymic disorder   ? Esophageal reflux   ? History of kidney stones   ? Insomnia   ? Lumbar herniated disc   ? Gets injections in back 3 times a year  ? Migraine   ? almost evrery day  ? Osteoporosis   ? Panic disorder   ? Pure hypercholesterolemia   ? Vertigo   ? with sinus infection  ? Vitamin D deficiency   ? ?Past Surgical History:  ?Procedure Laterality Date  ? CATARACT EXTRACTION W/PHACO Right 12/06/2018  ? Procedure: CATARACT EXTRACTION PHACO AND INTRAOCULAR LENS PLACEMENT (IOC)-RIGHT;  Surgeon: Marchia Meiers, MD;  Location: [facility];  Service: Ophthalmology;  Laterality: Right;  [place] 01:28.9 ?CDE 11.98 ?Fluid Pack Lot # [id number]  ? CATARACT EXTRACTION W/PHACO Left 09/15/2020  ? Procedure: CATARACT EXTRACTION PHACO AND INTRAOCULAR LENS PLACEMENT (IOC) LEFT 8.63 00:52.2;  Surgeon: Birder Robson, MD;  Location: [facility];  Service: Ophthalmology;  Laterality: Left;  ? clot removal from right sinus after surgery to remove tumor in 1995    ? COLONOSCOPY WITH PROPOFOL N/A 07/22/2021  ? Procedure: COLONOSCOPY WITH PROPOFOL;  Surgeon: Jonathon Bellows, MD;  Location: [facility];  Service: Gastroenterology;  Laterality: N/A;  ? LITHOTRIPSY  [phone]  ? renal stones  ? TUBAL LIGATION    ? TUMOR REMOVAL    ? behind right ete in sinuses- 1995 Dr. Pryor Ochoa  ? ?Family History  ?Problem Relation Age of Onset  ? Brain cancer Mother   ? Cancer Mother   ?     lung  ? Cancer Father   ?     lung   ? Brain cancer Father   ? ?Social History  ? ?Socioeconomic History  ? Marital status: Divorced  ?  Spouse name: Not on file  ? Number of children: 2  ? Years of education: Not on file  ? Highest education level: Some college, no degree  ?Occupational History  ? Occupation: retired  ?Tobacco  Use  ? Smoking status: Never  ? Smokeless tobacco: Never  ?Vaping Use  ? Vaping Use: Never used  ?Substance and Sexual Activity  ? Alcohol use: Yes  ?  Comment: rarely - 1 drinks   ? Drug use: No  ? Sexual activity: Not on file  ?Other Topics Concern  ? Not on file  ?Social History Narrative  ? Not on file  ? ?Social Determinants of Health  ? ?Financial Resource Strain: Not on file  ?Food Insecurity: Not on file  ?Transportation Needs: Not on file  ?Physical Activity: Not on file  ?Stress: Not on file  ?Social Connections: Not on file  ? ? ?Tobacco Counseling ?Counseling given: Not Answered ? ? ?Clinical Intake: ? ?Pre-visit preparation completed: Yes ? ?Pain : No/denies pain ? ?  ? ?Nutritional Risks: None ?  Diabetes: No ? ?How often do you need to have someone help you when you read instructions, pamphlets, or other written materials from your doctor or pharmacy?: 1 - Never ? ?Diabetic?no ? ?Interpreter Needed?: No ? ?Information entered by :: Kirke Shaggy, LPN ? ? ?Activities of Daily Living ?In your present state of health, do you have any difficulty performing the following activities: 12/14/2021 07/15/2021  ?Hearing? N N  ?Vision? N N  ?Difficulty concentrating or making decisions? N N  ?Walking or climbing stairs? N N  ?Dressing or bathing? N N  ?Doing errands, shopping? N N  ?Preparing Food and eating ? N -  ?Using the Toilet? N -  ?In the past six months, have you accidently leaked urine? N -  ?Do you have problems with loss of bowel control? N -  ?Managing your Medications? N -  ?Managing your Finances? N -  ?Housekeeping or managing your Housekeeping? N -  ?Some recent data might be hidden  ? ? ?Patient Care Team: ?Gwyneth Sprout, FNP as PCP - General (Family Medicine) ?Sharlet Salina, MD as Referring Physician (Physical Medicine and Rehabilitation) ?Marchia Meiers, MD as Consulting Physician (Ophthalmology) ?Erby Pian, MD as Referring Physician (Specialist) ?Vladimir Crofts, MD as Consulting  Physician (Neurology) ?Dasher, Rayvon Char, MD (Dermatology) ? ?Indicate any recent Medical Services you may have received from other than [facility] providers in the past year (date may be approximate). ? ?   ?Assessment:  ? T

## 2021-12-14 NOTE — Progress Notes (Signed)
? ?I,Elena D DeSanto,acting as a scribe for Gwyneth Sprout, FNP.,have documented all relevant documentation on the behalf of Gwyneth Sprout, FNP,as directed by  Gwyneth Sprout, FNP while in the presence of Gwyneth Sprout, FNP. ? ? ?MyChart Video Visit ? ? ? ?Virtual Visit via Video Note  ? ?This visit type was conducted due to national recommendations for restrictions regarding the COVID-19 Pandemic (e.g. social distancing) in an effort to limit this patient's exposure and mitigate transmission in our community. This patient is at least at moderate risk for complications without adequate follow up. This format is felt to be most appropriate for this patient at this time. Physical exam was limited by quality of the video and audio technology used for the visit.  ? ?Patient location: home ?Provider location:  ?[place] [address] ?[place], [place] [address] ? ? ?I discussed the limitations of evaluation and management by telemedicine and the availability of in person appointments. The patient expressed understanding and agreed to proceed. ? ?Patient: Brooke Mcdowell   DOB: 1950-11-10   71 y.o. Female  MRN: [id number] ?Visit Date: 12/14/2021 ? ?Today's healthcare provider: Gwyneth Sprout, FNP  ? ?Re-Introduced to nurse practitioner role and practice setting.  All questions answered.  Discussed provider/patient relationship and expectations. ? ? ?No chief complaint on file. ? ?Subjective  ?  ?HPI  ?Patient is a 71 year female who presents via video visit for evaluation of possible sinus infection.  Patient states she began having symptoms of headache 7 days ago.  At first she thought it may be the start of a migraine as she has history of them.  Her symptoms then developed into eye pain, cough, drainage and elevated headache.   ?She has used Tylenol or Ibuprofen, Mucinex, Flonase, Pseudoephedrine,  and Afrin to help her symptoms.  ? ? ?Medications: ?Outpatient Medications Prior to Visit   ?Medication Sig  ? cyanocobalamin (,VITAMIN B-12,) 1000 MCG/ML injection Inject 1 ml into muscle once a week for 4 weeks then once a month for 4 months.  ? EMGALITY 120 MG/ML SOAJ SMARTSIG:1 Milliliter(s) SUB-Q Every 4 Weeks  ? EQ GENTLE LUBRICANT 0.3 % SOLN Place 1 drop into both eyes at bedtime as needed (dry/irritated eyes.).  ? estradiol (ESTRACE) 1 MG tablet Take 1 tablet (1 mg total) by mouth daily.  ? Galcanezumab-gnlm (EMGALITY) 120 MG/ML SOAJ Inject into the skin.  ? ibuprofen (ADVIL) 200 MG tablet Take 200 mg by mouth every 6 (six) hours as needed.  ? Magnesium 250 MG TABS Take 500 mg by mouth every evening.   ? medroxyPROGESTERone (PROVERA) 2.5 MG tablet Take 1 tablet (2.5 mg total) by mouth daily.  ? Multiple Vitamins-Minerals (ADULT GUMMY PO) Take 2 tablets by mouth every evening. Vitafusion MultiVites Gummy  ? Na Sulfate-K Sulfate-Mg Sulf 17.5-3.13-1.6 GM/177ML SOLN Take by mouth.  ? naratriptan (AMERGE) 2.5 MG tablet Take by mouth.  ? NURTEC 75 MG TBDP Take by mouth daily.  ? omeprazole (PRILOSEC) 40 MG capsule Take 1 capsule (40 mg total) by mouth daily.  ? polyethylene glycol-electrolytes (NULYTELY) 420 g solution Take by mouth.  ? rizatriptan (MAXALT) 10 MG tablet Take 1 tablet for migraine. May repeat in 2 hours if needed. Maximum 30 mg daily.  ? rizatriptan (MAXALT) 10 MG tablet Take by mouth.  ? rosuvastatin (CRESTOR) 10 MG tablet TAKE 1 TABLET(10 MG) BY MOUTH DAILY  ? sertraline (ZOLOFT) 50 MG tablet Take 1 tablet (50 mg  total) by mouth at bedtime.  ? zolpidem (AMBIEN) 10 MG tablet TAKE 1 TABLET(10 MG) BY MOUTH AT BEDTIME AS NEEDED FOR SLEEP  ? meloxicam (MOBIC) 15 MG tablet TAKE 1 TABLET(15 MG) BY MOUTH DAILY (Patient not taking: Reported on 12/14/2021)  ? [DISCONTINUED] amoxicillin (AMOXIL) 875 MG tablet Take 875 mg by mouth 2 (two) times daily.  ? [DISCONTINUED] predniSONE (DELTASONE) 10 MG tablet Take by mouth. (Patient not taking: Reported on 12/14/2021)  ? ?No facility-administered  medications prior to visit.  ? ? ?Review of Systems  ?Constitutional:  Positive for chills, diaphoresis, fatigue and fever.  ?HENT:  Positive for congestion, ear pain, postnasal drip, sinus pressure, sinus pain, sneezing, sore throat and voice change. Negative for ear discharge, hearing loss, rhinorrhea, tinnitus and trouble swallowing.   ?Eyes:  Positive for pain. Negative for photophobia, discharge, redness, itching and visual disturbance.  ?Respiratory:  Positive for cough. Negative for shortness of breath and wheezing.   ?Cardiovascular:  Negative for chest pain, palpitations and leg swelling.  ?Gastrointestinal:  Positive for nausea. Negative for abdominal pain, constipation, diarrhea and vomiting.  ?Musculoskeletal:  Negative for myalgias.  ?Neurological:  Positive for headaches. Negative for dizziness and light-headedness.  ? ? ? Objective  ?  ?Temp 99.2 ?F (37.3 ?C) Comment: self reported  SpO2 99% Comment: self report ? ? ?Physical Exam ?Vitals and nursing note reviewed.  ?Constitutional:   ?   General: She is not in acute distress. ?   Appearance: She is ill-appearing. She is not diaphoretic.  ?HENT:  ?   Head: Normocephalic and atraumatic.  ?   Nose: Congestion and rhinorrhea present. Rhinorrhea is purulent.  ?   Right Sinus: Maxillary sinus tenderness and frontal sinus tenderness present.  ?   Left Sinus: Maxillary sinus tenderness and frontal sinus tenderness present.  ?   Mouth/Throat:  ?   Pharynx: No oropharyngeal exudate or posterior oropharyngeal erythema.  ?Pulmonary:  ?   Effort: Pulmonary effort is normal.  ?   Comments: Coughing during video exam ?Skin: ?   Comments: Reports warm skin, d/t low grade temperature  ?Neurological:  ?   General: No focal deficit present.  ?   Mental Status: She is alert.  ?Psychiatric:     ?   Mood and Affect: Mood normal.     ?   Behavior: Behavior normal.     ?   Thought Content: Thought content normal.     ?   Judgment: Judgment normal.  ?  ? ? ? Assessment &  Plan  ?  ? ?Problem List Items Addressed This Visit   ? ?  ? Respiratory  ? Bacterial sinusitis - Primary  ?  >7 days of symptoms ?Complaints of frontal and maxillary tenderness ?Associated eye pain ?Has tried many OTC treatments ?No hx of allergies, previously tested for allergens- negative results ?[place] test 12/10/21, negative ?No sick contacts ?Vaccinated for flu, PNA and covid ?Will treat with ABX with prednisone taper ?Recommend RTC if symtoms don't improve in 10 days  ?Plan to refer to ENT if symptoms return in 3 months ?Recommend continued use of OTC, over the counter medications to assist with recovery: ?--Nasal spray- Ocean nasal spray or similar, to ensure nasal passageways are moist and not dry ?--Nasal steroid- Flonase, or similar, assist with decrease inflammation, may not notice effect for 4 days or longer, can take 2 x/day. Best to blow nose prior to use.  ?--Delym or Robitussin DM- to assist with  cough, follow directions on package ?--Mucinex DM- ensure 32-64 oz of water to activate ?--Throat lozenges, sugar free candies, water- keep throat moistened  ? ?  ?  ? Relevant Medications  ? amoxicillin-clavulanate (AUGMENTIN) 875-125 MG tablet  ? predniSONE (STERAPRED UNI-PAK 21 TAB) 10 MG (21) TBPK tablet  ? ? ? ?Return if symptoms worsen or fail to improve.  ?  ? ?I discussed the assessment and treatment plan with the patient. The patient was provided an opportunity to ask questions and all were answered. The patient agreed with the plan and demonstrated an understanding of the instructions. ?  ?The patient was advised to call back or seek an in-person evaluation if the symptoms worsen or if the condition fails to improve as anticipated. ? ?I spent 15 minutes dedicated to the care of this patient on the date of this encounter, face-to-face time with the patient, addressing, planning, and treating Bacterial sinusitis.  ? ?I, Gwyneth Sprout, FNP, have reviewed all documentation for this visit. The  documentation on 12/14/21 for the exam, diagnosis, procedures, and orders are all accurate and complete. ? ? ?Gwyneth Sprout, FNP ?[place] ?[phone] (phone) ?[phone] (fax) ? ?[place]

## 2021-12-14 NOTE — Assessment & Plan Note (Signed)
>  7 days of symptoms ?Complaints of frontal and maxillary tenderness ?Associated eye pain ?Has tried many OTC treatments ?No hx of allergies, previously tested for allergens- negative results ?[place] test 12/10/21, negative ?No sick contacts ?Vaccinated for flu, PNA and covid ?Will treat with ABX with prednisone taper ?Recommend RTC if symtoms don't improve in 10 days  ?Plan to refer to ENT if symptoms return in 3 months ?Recommend continued use of OTC, over the counter medications to assist with recovery: ?--Nasal spray- Ocean nasal spray or similar, to ensure nasal passageways are moist and not dry ?--Nasal steroid- Flonase, or similar, assist with decrease inflammation, may not notice effect for 4 days or longer, can take 2 x/day. Best to blow nose prior to use.  ?--Delym or Robitussin DM- to assist with cough, follow directions on package ?--Mucinex DM- ensure 32-64 oz of water to activate ?--Throat lozenges, sugar free candies, water- keep throat moistened  ? ?

## 2021-12-14 NOTE — Patient Instructions (Signed)
Brooke Mcdowell , ?Thank you for taking time to come for your Medicare Wellness Visit. I appreciate your ongoing commitment to your health goals. Please review the following plan we discussed and let me know if I can assist you in the future.  ? ?Screening recommendations/referrals: ?Colonoscopy: 07/22/21 ?Mammogram: 07/28/21 ?Bone Density: 10/30/17, referral sent ?Recommended yearly ophthalmology/optometry visit for glaucoma screening and checkup ?Recommended yearly dental visit for hygiene and checkup ? ?Vaccinations: ?Influenza vaccine: 07/15/21 ?Pneumococcal vaccine: 07/03/18 ?Tdap vaccine: 03/17/17 ?Shingles vaccine: Zostavax 04/26/13   ?Covid-19:11/14/19, 12/12/19 ? ?Advanced directives: no ? ?Conditions/risks identified: none ? ?Next appointment: Follow up in one year for your annual wellness visit 12/19/22 @ 1:40pm by phone ? ? ?Preventive Care 72 Years and Older, Female ?Preventive care refers to lifestyle choices and visits with your health care provider that can promote health and wellness. ?What does preventive care include? ?A yearly physical exam. This is also called an annual well check. ?Dental exams once or twice a year. ?Routine eye exams. Ask your health care provider how often you should have your eyes checked. ?Personal lifestyle choices, including: ?Daily care of your teeth and gums. ?Regular physical activity. ?Eating a healthy diet. ?Avoiding tobacco and drug use. ?Limiting alcohol use. ?Practicing safe sex. ?Taking low-dose aspirin every day. ?Taking vitamin and mineral supplements as recommended by your health care provider. ?What happens during an annual well check? ?The services and screenings done by your health care provider during your annual well check will depend on your age, overall health, lifestyle risk factors, and family history of disease. ?Counseling  ?Your health care provider may ask you questions about your: ?Alcohol use. ?Tobacco use. ?Drug use. ?Emotional well-being. ?Home and  relationship well-being. ?Sexual activity. ?Eating habits. ?History of falls. ?Memory and ability to understand (cognition). ?Work and work Statistician. ?Reproductive health. ?Screening  ?You may have the following tests or measurements: ?Height, weight, and BMI. ?Blood pressure. ?Lipid and cholesterol levels. These may be checked every 5 years, or more frequently if you are over 60 years old. ?Skin check. ?Lung cancer screening. You may have this screening every year starting at age 59 if you have a 30-pack-year history of smoking and currently smoke or have quit within the past 15 years. ?Fecal occult blood test (FOBT) of the stool. You may have this test every year starting at age 75. ?Flexible sigmoidoscopy or colonoscopy. You may have a sigmoidoscopy every 5 years or a colonoscopy every 10 years starting at age 57. ?Hepatitis C blood test. ?Hepatitis B blood test. ?Sexually transmitted disease (STD) testing. ?Diabetes screening. This is done by checking your blood sugar (glucose) after you have not eaten for a while (fasting). You may have this done every 1-3 years. ?Bone density scan. This is done to screen for osteoporosis. You may have this done starting at age 57. ?Mammogram. This may be done every 1-2 years. Talk to your health care provider about how often you should have regular mammograms. ?Talk with your health care provider about your test results, treatment options, and if necessary, the need for more tests. ?Vaccines  ?Your health care provider may recommend certain vaccines, such as: ?Influenza vaccine. This is recommended every year. ?Tetanus, diphtheria, and acellular pertussis (Tdap, Td) vaccine. You may need a Td booster every 10 years. ?Zoster vaccine. You may need this after age 110. ?Pneumococcal 13-valent conjugate (PCV13) vaccine. One dose is recommended after age 44. ?Pneumococcal polysaccharide (PPSV23) vaccine. One dose is recommended after age 22. ?Talk to your health  care provider  about which screenings and vaccines you need and how often you need them. ?This information is not intended to replace advice given to you by your health care provider. Make sure you discuss any questions you have with your health care provider. ?Document Released: 10/16/2015 Document Revised: 06/08/2016 Document Reviewed: 07/21/2015 ?Elsevier Interactive Patient Education ? 2017 [place]. ? ?Fall Prevention in the Home ?Falls can cause injuries. They can happen to people of all ages. There are many things you can do to make your home safe and to help prevent falls. ?What can I do on the outside of my home? ?Regularly fix the edges of walkways and driveways and fix any cracks. ?Remove anything that might make you trip as you walk through a door, such as a raised step or threshold. ?Trim any bushes or trees on the path to your home. ?Use bright outdoor lighting. ?Clear any walking paths of anything that might make someone trip, such as rocks or tools. ?Regularly check to see if handrails are loose or broken. Make sure that both sides of any steps have handrails. ?Any raised decks and porches should have guardrails on the edges. ?Have any leaves, snow, or ice cleared regularly. ?Use sand or salt on walking paths during winter. ?Clean up any spills in your garage right away. This includes oil or grease spills. ?What can I do in the bathroom? ?Use night lights. ?Install grab bars by the toilet and in the tub and shower. Do not use towel bars as grab bars. ?Use non-skid mats or decals in the tub or shower. ?If you need to sit down in the shower, use a plastic, non-slip stool. ?Keep the floor dry. Clean up any water that spills on the floor as soon as it happens. ?Remove soap buildup in the tub or shower regularly. ?Attach bath mats securely with double-sided non-slip rug tape. ?Do not have throw rugs and other things on the floor that can make you trip. ?What can I do in the bedroom? ?Use night lights. ?Make sure  that you have a light by your bed that is easy to reach. ?Do not use any sheets or blankets that are too big for your bed. They should not hang down onto the floor. ?Have a firm chair that has side arms. You can use this for support while you get dressed. ?Do not have throw rugs and other things on the floor that can make you trip. ?What can I do in the kitchen? ?Clean up any spills right away. ?Avoid walking on wet floors. ?Keep items that you use a lot in easy-to-reach places. ?If you need to reach something above you, use a strong step stool that has a grab bar. ?Keep electrical cords out of the way. ?Do not use floor polish or wax that makes floors slippery. If you must use wax, use non-skid floor wax. ?Do not have throw rugs and other things on the floor that can make you trip. ?What can I do with my stairs? ?Do not leave any items on the stairs. ?Make sure that there are handrails on both sides of the stairs and use them. Fix handrails that are broken or loose. Make sure that handrails are as long as the stairways. ?Check any carpeting to make sure that it is firmly attached to the stairs. Fix any carpet that is loose or worn. ?Avoid having throw rugs at the top or bottom of the stairs. If you do have throw rugs, attach them to  the floor with carpet tape. ?Make sure that you have a light switch at the top of the stairs and the bottom of the stairs. If you do not have them, ask someone to add them for you. ?What else can I do to help prevent falls? ?Wear shoes that: ?Do not have high heels. ?Have rubber bottoms. ?Are comfortable and fit you well. ?Are closed at the toe. Do not wear sandals. ?If you use a stepladder: ?Make sure that it is fully opened. Do not climb a closed stepladder. ?Make sure that both sides of the stepladder are locked into place. ?Ask someone to hold it for you, if possible. ?Clearly mark and make sure that you can see: ?Any grab bars or handrails. ?First and last steps. ?Where the edge of  each step is. ?Use tools that help you move around (mobility aids) if they are needed. These include: ?Canes. ?Walkers. ?Scooters. ?Crutches. ?Turn on the lights when you go into a dark area. Replace any light bul

## 2021-12-24 IMAGING — MG DIGITAL SCREENING BILAT W/ TOMO W/ CAD
8 series · 9 of 24 positions shown · non-contrast
Comparison: Previous exam(s).

CLINICAL DATA: Screening.

EXAM:
DIGITAL SCREENING BILATERAL MAMMOGRAM WITH TOMO AND CAD

[L CC synth-2D]
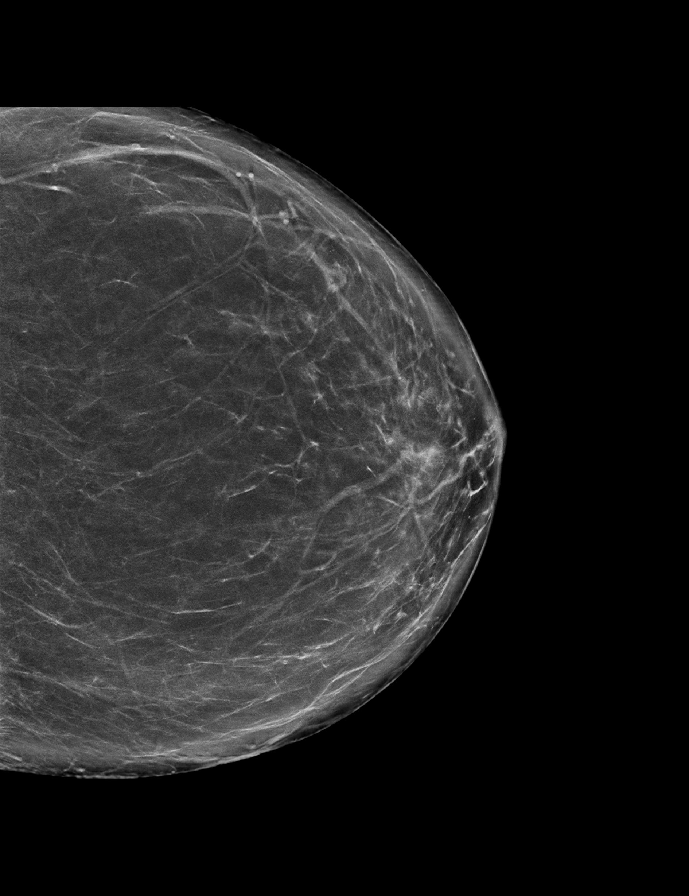

[R CC synth-2D]
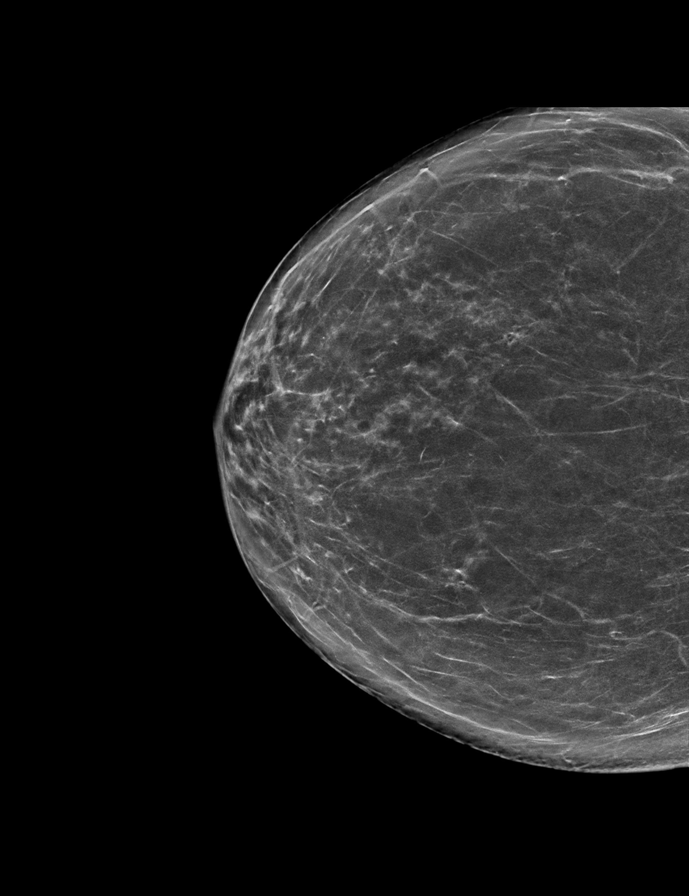

[L MLO synth-2D]
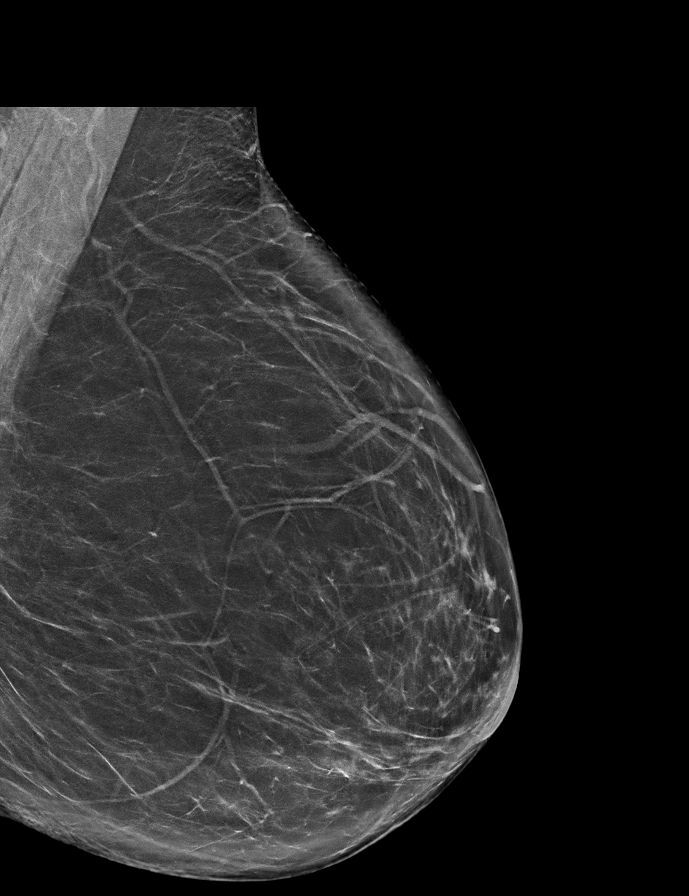

[R MLO synth-2D]
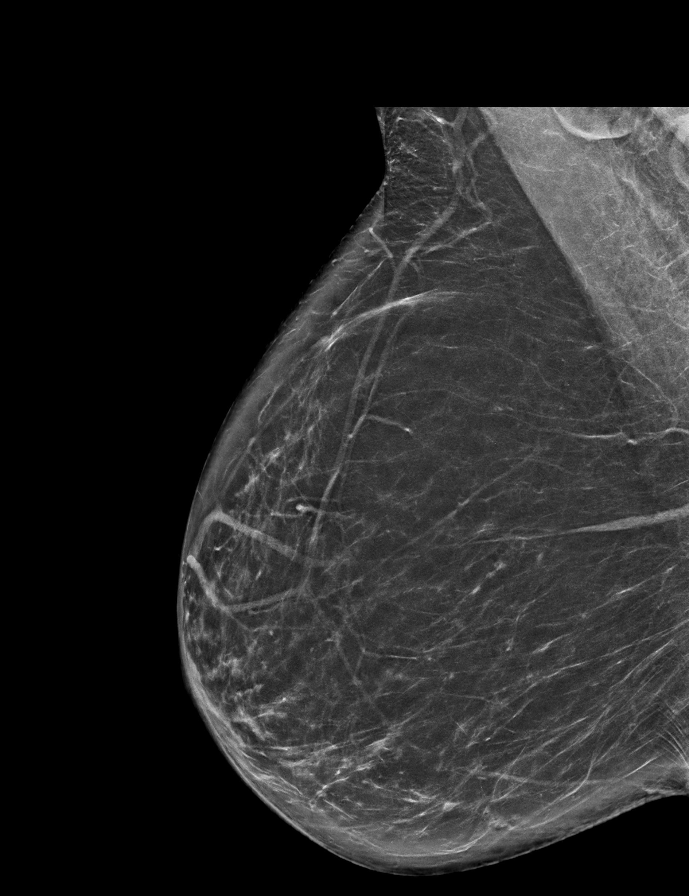

[L CC tomo · 2 of 77 frames shown]
[frame 25/77]
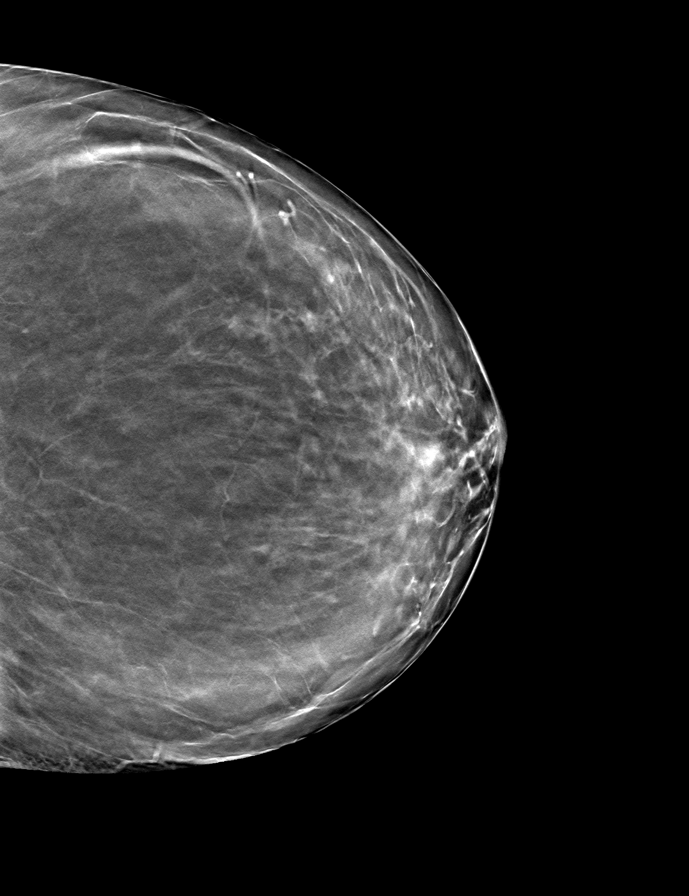
[frame 39/77]
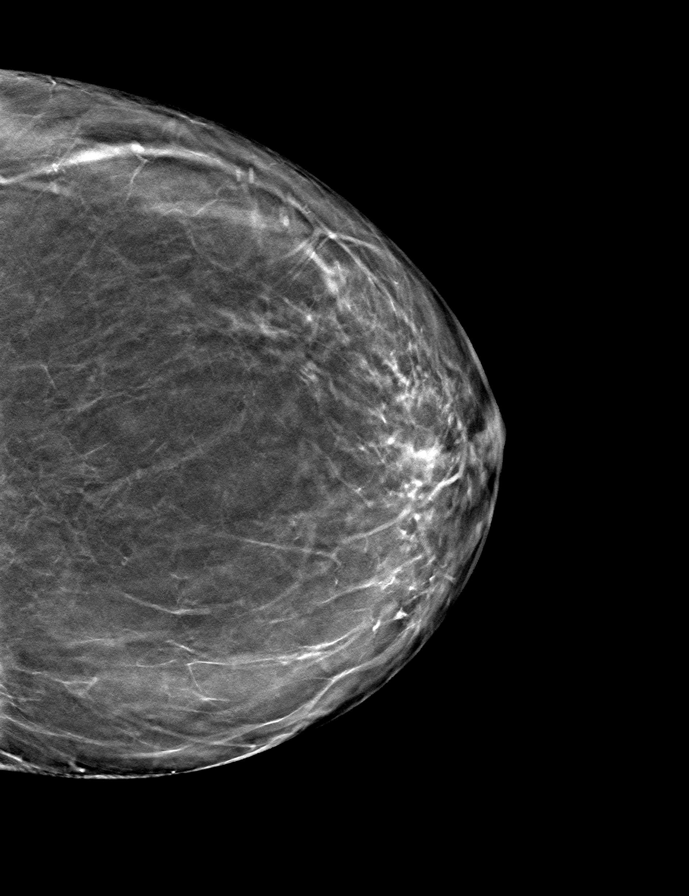

[R MLO tomo · tomo slice 39/77.0]
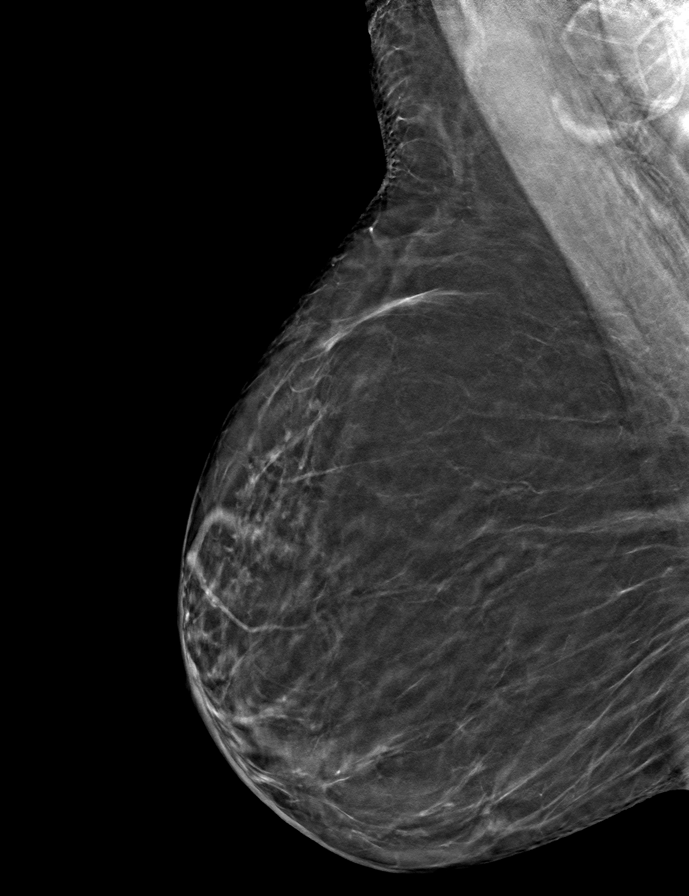

[L MLO tomo · tomo slice 39/78.0]
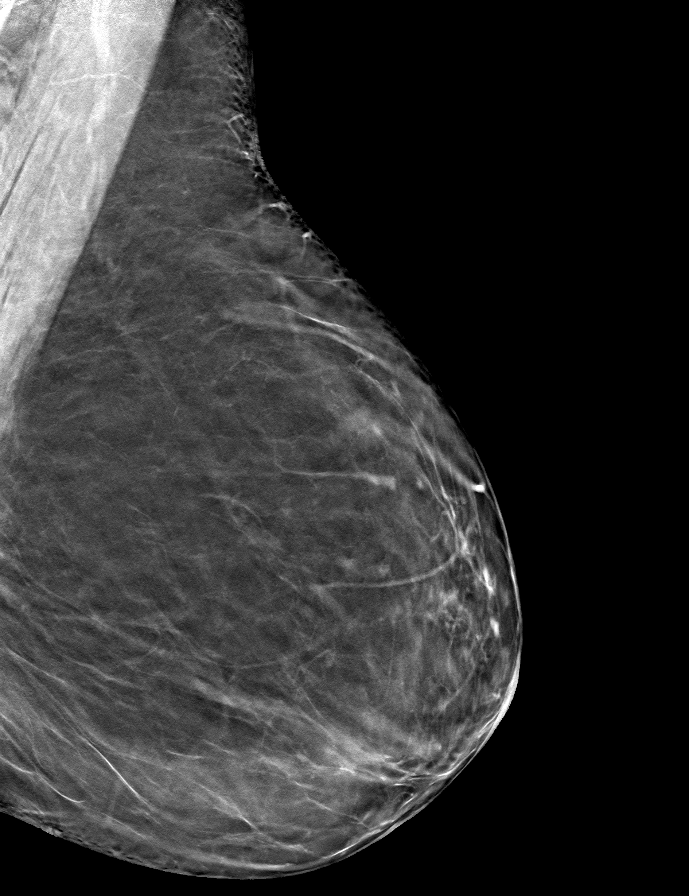

[R CC tomo · tomo slice 37/73.0]
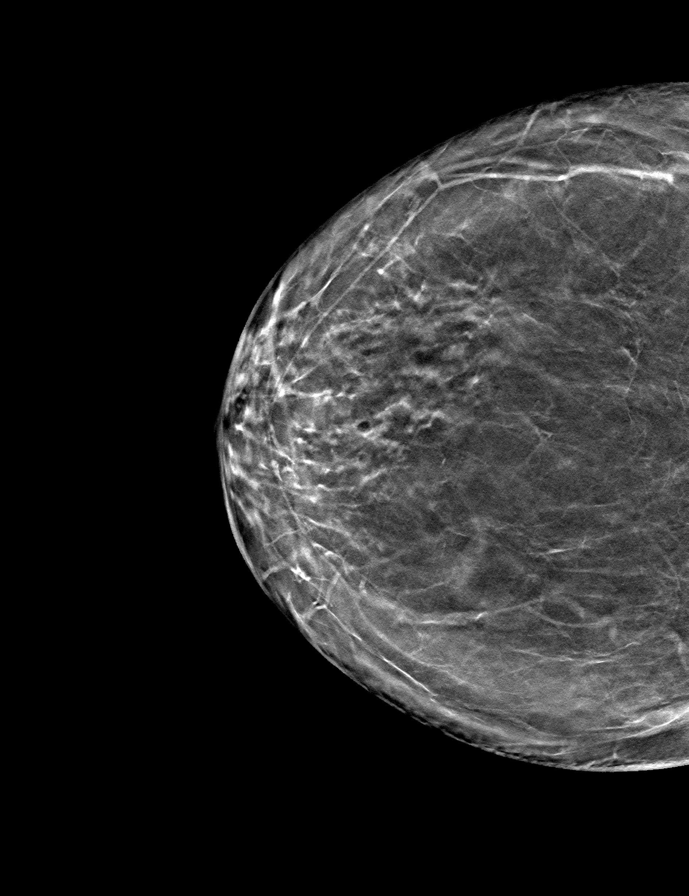

[9 of 24 positions shown; findings below may reference images not displayed]

ACR Breast Density Category b: There are scattered areas of
fibroglandular density.
FINDINGS: There are no findings suspicious for malignancy. Images were
processed with CAD.
IMPRESSION: No mammographic evidence of malignancy. A result letter of this
screening mammogram will be mailed directly to the patient.

RECOMMENDATION:
Screening mammogram in one year. (Code:[id number])

BI-RADS CATEGORY  1: Negative.

## 2021-12-31 DIAGNOSIS — J019 Acute sinusitis, unspecified: Secondary | ICD-10-CM | POA: Diagnosis not present

## 2021-12-31 DIAGNOSIS — J029 Acute pharyngitis, unspecified: Secondary | ICD-10-CM | POA: Diagnosis not present

## 2021-12-31 DIAGNOSIS — Z03818 Encounter for observation for suspected exposure to other biological agents ruled out: Secondary | ICD-10-CM | POA: Diagnosis not present

## 2021-12-31 DIAGNOSIS — B9689 Other specified bacterial agents as the cause of diseases classified elsewhere: Secondary | ICD-10-CM | POA: Diagnosis not present

## 2022-01-20 DIAGNOSIS — M5481 Occipital neuralgia: Secondary | ICD-10-CM | POA: Diagnosis not present

## 2022-01-20 DIAGNOSIS — M7912 Myalgia of auxiliary muscles, head and neck: Secondary | ICD-10-CM | POA: Diagnosis not present

## 2022-01-20 DIAGNOSIS — M542 Cervicalgia: Secondary | ICD-10-CM | POA: Diagnosis not present

## 2022-01-21 DIAGNOSIS — J310 Chronic rhinitis: Secondary | ICD-10-CM | POA: Diagnosis not present

## 2022-01-21 DIAGNOSIS — R053 Chronic cough: Secondary | ICD-10-CM | POA: Diagnosis not present

## 2022-01-21 DIAGNOSIS — K219 Gastro-esophageal reflux disease without esophagitis: Secondary | ICD-10-CM | POA: Diagnosis not present

## 2022-01-22 ENCOUNTER — Other Ambulatory Visit: Payer: Self-pay | Admitting: Family Medicine

## 2022-01-22 DIAGNOSIS — M19041 Primary osteoarthritis, right hand: Secondary | ICD-10-CM

## 2022-01-24 NOTE — Telephone Encounter (Signed)
Requested medication (s) are due for refill today: yes ? ?Requested medication (s) are on the active medication list: yes ? ?Last refill:  10/29/21 ? ?Future visit scheduled: no ? ?Notes to clinic:  Unable to refill per protocol due to failed labs, no updated results. ? ? ? ?  ?Requested Prescriptions  ?Pending Prescriptions Disp Refills  ? meloxicam (MOBIC) 15 MG tablet [Pharmacy Med Name: MELOXICAM 15MG TABLETS] 90 tablet 0  ?  Sig: TAKE 1 TABLET(15 MG) BY MOUTH DAILY  ?  ? Analgesics:  COX2 Inhibitors Failed - 01/22/2022  6:19 AM  ?  ?  Failed - Manual Review: Labs are only required if the patient has taken medication for more than 8 weeks.  ?  ?  Failed - HGB in normal range and within 360 days  ?  Hemoglobin  ?Date Value Ref Range Status  ?07/16/2020 13.8 11.1 - 15.9 g/dL Final  ?  ?  ?  ?  Failed - Cr in normal range and within 360 days  ?  Creatinine, Ser  ?Date Value Ref Range Status  ?07/16/2020 0.76 0.57 - 1.00 mg/dL Final  ?  ?  ?  ?  Failed - HCT in normal range and within 360 days  ?  Hematocrit  ?Date Value Ref Range Status  ?07/16/2020 40.6 34.0 - 46.6 % Final  ?  ?  ?  ?  Failed - AST in normal range and within 360 days  ?  AST  ?Date Value Ref Range Status  ?07/16/2020 20 0 - 40 IU/L Final  ?  ?  ?  ?  Failed - ALT in normal range and within 360 days  ?  ALT  ?Date Value Ref Range Status  ?07/16/2020 22 0 - 32 IU/L Final  ?  ?  ?  ?  Failed - eGFR is 30 or above and within 360 days  ?  GFR calc Af Amer  ?Date Value Ref Range Status  ?07/16/2020 92 >59 mL/min/1.73 Final  ?  Comment:  ?  **In accordance with recommendations from the NKF-ASN Task force,** ?  [facility] is in the process of updating its eGFR calculation to the ?  2021 CKD-EPI creatinine equation that estimates kidney function ?  without a race variable. ?  ? ?GFR calc non Af Amer  ?Date Value Ref Range Status  ?07/16/2020 80 >59 mL/min/1.73 Final  ?  ?  ?  ?  Passed - Patient is not pregnant  ?  ?  Passed - Valid encounter within last 12  months  ?  Recent Outpatient Visits   ? ?      ? 1 month ago Bacterial sinusitis  ? [facility] Tally Joe T, FNP  ? 6 months ago Screening for colon cancer  ? [facility] Tally Joe T, FNP  ? 1 year ago Acute non-recurrent pansinusitis  ? [facility] Flinchum, Kelby Aline, FNP  ? 1 year ago Annual physical exam  ? [facility] [name] M, [place]  ? 2 years ago Cough  ? [facility] [place], [place], [place]  ? ?  ?  ? ? ?  ?  ?  ? ? ?

## 2022-02-01 DIAGNOSIS — J45909 Unspecified asthma, uncomplicated: Secondary | ICD-10-CM | POA: Diagnosis not present

## 2022-02-10 ENCOUNTER — Ambulatory Visit
Admission: RE | Admit: 2022-02-10 | Discharge: 2022-02-10 | Disposition: A | Discharge status: Home / Self Care | Payer: PPO | Source: Ambulatory Visit | Attending: Family Medicine | Admitting: Family Medicine

## 2022-02-10 DIAGNOSIS — Z780 Asymptomatic menopausal state: Secondary | ICD-10-CM | POA: Insufficient documentation

## 2022-02-10 DIAGNOSIS — M85831 Other specified disorders of bone density and structure, right forearm: Secondary | ICD-10-CM | POA: Diagnosis not present

## 2022-02-21 DIAGNOSIS — J45909 Unspecified asthma, uncomplicated: Secondary | ICD-10-CM | POA: Diagnosis not present

## 2022-02-21 DIAGNOSIS — R0609 Other forms of dyspnea: Secondary | ICD-10-CM | POA: Diagnosis not present

## 2022-02-21 DIAGNOSIS — J310 Chronic rhinitis: Secondary | ICD-10-CM | POA: Diagnosis not present

## 2022-02-21 DIAGNOSIS — R053 Chronic cough: Secondary | ICD-10-CM | POA: Diagnosis not present

## 2022-02-25 DIAGNOSIS — M1811 Unilateral primary osteoarthritis of first carpometacarpal joint, right hand: Secondary | ICD-10-CM | POA: Diagnosis not present

## 2022-02-25 DIAGNOSIS — M79644 Pain in right finger(s): Secondary | ICD-10-CM | POA: Diagnosis not present

## 2022-03-04 DIAGNOSIS — J45909 Unspecified asthma, uncomplicated: Secondary | ICD-10-CM | POA: Diagnosis not present

## 2022-03-25 DIAGNOSIS — L538 Other specified erythematous conditions: Secondary | ICD-10-CM | POA: Diagnosis not present

## 2022-03-25 DIAGNOSIS — D0461 Carcinoma in situ of skin of right upper limb, including shoulder: Secondary | ICD-10-CM | POA: Diagnosis not present

## 2022-03-25 DIAGNOSIS — L820 Inflamed seborrheic keratosis: Secondary | ICD-10-CM | POA: Diagnosis not present

## 2022-03-25 DIAGNOSIS — D485 Neoplasm of uncertain behavior of skin: Secondary | ICD-10-CM | POA: Diagnosis not present

## 2022-04-03 DIAGNOSIS — J45909 Unspecified asthma, uncomplicated: Secondary | ICD-10-CM | POA: Diagnosis not present

## 2022-04-05 ENCOUNTER — Other Ambulatory Visit: Payer: Self-pay | Admitting: Family Medicine

## 2022-04-05 DIAGNOSIS — G4700 Insomnia, unspecified: Secondary | ICD-10-CM

## 2022-04-06 NOTE — Telephone Encounter (Signed)
Requested medication (s) are due for refill today - yes  Requested medication (s) are on the active medication list -yes  Future visit scheduled -no  Last refill: 10/05/21 #30 5RF  Notes to clinic: non delegated Rx  Requested Prescriptions  Pending Prescriptions Disp Refills   zolpidem (AMBIEN) 10 MG tablet [Pharmacy Med Name: ZOLPIDEM '10MG'$  TABLETS] 30 tablet     Sig: TAKE 1 TABLET(10 MG) BY MOUTH AT BEDTIME AS NEEDED FOR SLEEP     Not Delegated - Psychiatry:  Anxiolytics/Hypnotics Failed - 04/05/2022 11:53 PM      Failed - This refill cannot be delegated      Failed - Urine Drug Screen completed in last 360 days      Passed - Valid encounter within last 6 months    Recent Outpatient Visits           3 months ago Bacterial sinusitis   [facility] Gwyneth Sprout, FNP   8 months ago Screening for colon cancer   [facility] Gwyneth Sprout, FNP   1 year ago Acute non-recurrent pansinusitis   [place] Flinchum, Kelby Aline, FNP   1 year ago Annual physical exam   [facility] [name] M, [place]   2 years ago Cough   [place], [place], [place]                 Requested Prescriptions  Pending Prescriptions Disp Refills   zolpidem (AMBIEN) 10 MG tablet [Pharmacy Med Name: ZOLPIDEM '10MG'$  TABLETS] 30 tablet     Sig: TAKE 1 TABLET(10 MG) BY MOUTH AT BEDTIME AS NEEDED FOR SLEEP     Not Delegated - Psychiatry:  Anxiolytics/Hypnotics Failed - 04/05/2022 11:53 PM      Failed - This refill cannot be delegated      Failed - Urine Drug Screen completed in last 360 days      Passed - Valid encounter within last 6 months    Recent Outpatient Visits           3 months ago Bacterial sinusitis   [facility] Gwyneth Sprout, FNP   8 months ago Screening for colon cancer   [facility] Gwyneth Sprout, FNP   1 year ago Acute non-recurrent pansinusitis   [facility] Flinchum, Kelby Aline, FNP   1 year ago Annual physical exam   [facility] [name] M, [place]   2 years ago Cough   [facility] [name] M, [place]

## 2022-04-11 DIAGNOSIS — D0461 Carcinoma in situ of skin of right upper limb, including shoulder: Secondary | ICD-10-CM | POA: Diagnosis not present

## 2022-04-19 DIAGNOSIS — M7918 Myalgia, other site: Secondary | ICD-10-CM | POA: Diagnosis not present

## 2022-04-19 DIAGNOSIS — M5481 Occipital neuralgia: Secondary | ICD-10-CM | POA: Diagnosis not present

## 2022-04-19 DIAGNOSIS — M542 Cervicalgia: Secondary | ICD-10-CM | POA: Diagnosis not present

## 2022-04-29 DIAGNOSIS — M542 Cervicalgia: Secondary | ICD-10-CM | POA: Diagnosis not present

## 2022-04-29 DIAGNOSIS — M5481 Occipital neuralgia: Secondary | ICD-10-CM | POA: Diagnosis not present

## 2022-04-29 DIAGNOSIS — G43719 Chronic migraine without aura, intractable, without status migrainosus: Secondary | ICD-10-CM | POA: Diagnosis not present

## 2022-04-29 DIAGNOSIS — R9082 White matter disease, unspecified: Secondary | ICD-10-CM | POA: Diagnosis not present

## 2022-05-04 DIAGNOSIS — J45909 Unspecified asthma, uncomplicated: Secondary | ICD-10-CM | POA: Diagnosis not present

## 2022-05-17 ENCOUNTER — Other Ambulatory Visit: Payer: Self-pay | Admitting: Student

## 2022-05-17 ENCOUNTER — Other Ambulatory Visit: Payer: Self-pay | Admitting: Neurology

## 2022-05-17 DIAGNOSIS — M542 Cervicalgia: Secondary | ICD-10-CM

## 2022-05-17 DIAGNOSIS — R202 Paresthesia of skin: Secondary | ICD-10-CM

## 2022-05-17 DIAGNOSIS — M5481 Occipital neuralgia: Secondary | ICD-10-CM

## 2022-05-23 ENCOUNTER — Ambulatory Visit
Admission: RE | Admit: 2022-05-23 | Discharge: 2022-05-23 | Disposition: A | Discharge status: Home / Self Care | Payer: PPO | Source: Ambulatory Visit | Attending: Student | Admitting: Student

## 2022-05-23 DIAGNOSIS — M5481 Occipital neuralgia: Secondary | ICD-10-CM | POA: Diagnosis not present

## 2022-05-23 DIAGNOSIS — M542 Cervicalgia: Secondary | ICD-10-CM | POA: Diagnosis not present

## 2022-05-23 DIAGNOSIS — M4802 Spinal stenosis, cervical region: Secondary | ICD-10-CM | POA: Diagnosis not present

## 2022-05-24 DIAGNOSIS — J302 Other seasonal allergic rhinitis: Secondary | ICD-10-CM | POA: Diagnosis not present

## 2022-05-24 DIAGNOSIS — R053 Chronic cough: Secondary | ICD-10-CM | POA: Diagnosis not present

## 2022-05-24 DIAGNOSIS — K219 Gastro-esophageal reflux disease without esophagitis: Secondary | ICD-10-CM | POA: Diagnosis not present

## 2022-06-04 DIAGNOSIS — J45909 Unspecified asthma, uncomplicated: Secondary | ICD-10-CM | POA: Diagnosis not present

## 2022-07-04 DIAGNOSIS — J45909 Unspecified asthma, uncomplicated: Secondary | ICD-10-CM | POA: Diagnosis not present

## 2022-07-17 ENCOUNTER — Other Ambulatory Visit: Payer: Self-pay | Admitting: Family Medicine

## 2022-07-17 DIAGNOSIS — M19041 Primary osteoarthritis, right hand: Secondary | ICD-10-CM

## 2022-07-17 DIAGNOSIS — Z79890 Hormone replacement therapy (postmenopausal): Secondary | ICD-10-CM

## 2022-07-17 DIAGNOSIS — F419 Anxiety disorder, unspecified: Secondary | ICD-10-CM

## 2022-07-20 ENCOUNTER — Encounter: Payer: Self-pay | Admitting: Family Medicine

## 2022-07-20 ENCOUNTER — Ambulatory Visit (INDEPENDENT_AMBULATORY_CARE_PROVIDER_SITE_OTHER): Payer: PPO | Admitting: Family Medicine

## 2022-07-20 VITALS — BP 133/85 | HR 54 | Resp 16 | Ht 61.0 in | Wt 117.0 lb

## 2022-07-20 DIAGNOSIS — E782 Mixed hyperlipidemia: Secondary | ICD-10-CM

## 2022-07-20 DIAGNOSIS — F419 Anxiety disorder, unspecified: Secondary | ICD-10-CM | POA: Diagnosis not present

## 2022-07-20 NOTE — Assessment & Plan Note (Signed)
Chronic, stable Has maintained on Zoloft for 50 mg Denies current complaints

## 2022-07-20 NOTE — Progress Notes (Signed)
Established patient visit   Patient: Brooke Mcdowell   DOB: [birth year]   71 y.o. Female  MRN: [id number] Visit Date: 07/20/2022  Today's healthcare provider: Gwyneth Sprout, FNP  Introduced to nurse practitioner role and practice setting.  All questions answered.  Discussed provider/patient relationship and expectations.   I,Tiffany J Bragg,acting as a scribe for Gwyneth Sprout, FNP.,have documented all relevant documentation on the behalf of Gwyneth Sprout, FNP,as directed by  Gwyneth Sprout, FNP while in the presence of Gwyneth Sprout, FNP.   Chief Complaint  Patient presents with   Hyperlipidemia   Anxiety   Subjective    HPI  Anxiety, Follow-up  She was last seen for anxiety 1 years ago. Changes made at last visit include continue medications.   She reports excellent compliance with treatment. She reports excellent tolerance of treatment. She is not having side effects.   She feels her anxiety is mild and Unchanged since last visit.  Symptoms: No chest pain No difficulty concentrating  No dizziness No fatigue  No feelings of losing control Yes insomnia  No irritable No palpitations  No panic attacks No racing thoughts  No shortness of breath No sweating  No tremors/shakes    GAD-7 Results     No data to display          PHQ-9 Scores    07/20/2022    2:00 PM 12/14/2021    2:28 PM 07/15/2021    1:58 PM  PHQ9 SCORE ONLY  PHQ-9 Total Score 2 0 4    ---------------------------------------------------------------------------------------------------  Lipid/Cholesterol, Follow-up  Last lipid panel Other pertinent labs  Lab Results  Component Value Date   CHOL 160 07/16/2020   HDL 71 07/16/2020   LDLCALC 75 07/16/2020   TRIG 73 07/16/2020   CHOLHDL 2.3 07/16/2020   Lab Results  Component Value Date   ALT 22 07/16/2020   AST 20 07/16/2020   PLT 253 07/16/2020   TSH 3.360 07/16/2020     She was last seen for this 1 years ago.  Management since  that visit includes continue medications.  She reports excellent compliance with treatment. She is not having side effects.   Symptoms: No chest pain No chest pressure/discomfort  No dyspnea No lower extremity edema  No numbness or tingling of extremity No orthopnea  No palpitations No paroxysmal nocturnal dyspnea  No speech difficulty No syncope   Current diet: well balanced Current exercise:  walking  The ASCVD Risk score (Arnett DK, et al., 2019) failed to calculate for the following reasons:   Unable to determine if patient is Non-Hispanic African American  ---------------------------------------------------------------------------------------------------   Medications: Outpatient Medications Prior to Visit  Medication Sig   EMGALITY 120 MG/ML SOAJ SMARTSIG:1 Milliliter(s) SUB-Q Every 4 Weeks   EQ GENTLE LUBRICANT 0.3 % SOLN Place 1 drop into both eyes at bedtime as needed (dry/irritated eyes.).   estradiol (ESTRACE) 1 MG tablet Take 1 tablet (1 mg total) by mouth daily.   fluticasone (FLONASE) 50 MCG/ACT nasal spray Place 2 sprays into both nostrils daily.   Galcanezumab-gnlm (EMGALITY) 120 MG/ML SOAJ Inject into the skin.   Magnesium 250 MG TABS Take 500 mg by mouth every evening.    medroxyPROGESTERone (PROVERA) 2.5 MG tablet Take 1 tablet (2.5 mg total) by mouth daily.   meloxicam (MOBIC) 15 MG tablet TAKE 1 TABLET(15 MG) BY MOUTH DAILY   Multiple Vitamins-Minerals (ADULT GUMMY PO) Take 2 tablets by mouth every evening.  Vitafusion MultiVites Gummy   naratriptan (AMERGE) 2.5 MG tablet Take by mouth.   NURTEC 75 MG TBDP Take by mouth daily.   omeprazole (PRILOSEC) 40 MG capsule Take 1 capsule (40 mg total) by mouth daily.   predniSONE (STERAPRED UNI-PAK 21 TAB) 10 MG (21) TBPK tablet Take as written with morning meal.   rosuvastatin (CRESTOR) 10 MG tablet TAKE 1 TABLET(10 MG) BY MOUTH DAILY   sertraline (ZOLOFT) 50 MG tablet Take 1 tablet (50 mg total) by mouth at bedtime.    zolpidem (AMBIEN) 10 MG tablet TAKE 1 TABLET(10 MG) BY MOUTH AT BEDTIME AS NEEDED FOR SLEEP   [DISCONTINUED] amoxicillin-clavulanate (AUGMENTIN) 875-125 MG tablet Take 1 tablet by mouth 2 (two) times daily.   [DISCONTINUED] cyanocobalamin (,VITAMIN B-12,) 1000 MCG/ML injection Inject 1 ml into muscle once a week for 4 weeks then once a month for 4 months. (Patient not taking: Reported on 12/14/2021)   [DISCONTINUED] Na Sulfate-K Sulfate-Mg Sulf 17.5-3.13-1.6 GM/177ML SOLN Take by mouth. (Patient not taking: Reported on 12/14/2021)   [DISCONTINUED] polyethylene glycol-electrolytes (NULYTELY) 420 g solution Take by mouth. (Patient not taking: Reported on 07/20/2022)   [DISCONTINUED] rizatriptan (MAXALT) 10 MG tablet Take 1 tablet for migraine. May repeat in 2 hours if needed. Maximum 30 mg daily. (Patient not taking: Reported on 12/14/2021)   [DISCONTINUED] rizatriptan (MAXALT) 10 MG tablet Take by mouth. (Patient not taking: Reported on 12/14/2021)   No facility-administered medications prior to visit.    Review of Systems   Objective    BP 133/85 (BP Location: Left Arm, Patient Position: Sitting, Cuff Size: Normal)   Pulse (!) 54   Resp 16   Ht '5\' 1"'$  (1.549 m)   Wt 117 lb (53.1 kg)   SpO2 98%   BMI 22.11 kg/m   Physical Exam Vitals and nursing note reviewed.  Constitutional:      General: She is not in acute distress.    Appearance: Normal appearance. She is normal weight. She is not ill-appearing, toxic-appearing or diaphoretic.  HENT:     Head: Normocephalic and atraumatic.  Cardiovascular:     Rate and Rhythm: Normal rate and regular rhythm.     Pulses: Normal pulses.     Heart sounds: Normal heart sounds. No murmur heard.    No friction rub. No gallop.  Pulmonary:     Effort: Pulmonary effort is normal. No respiratory distress.     Breath sounds: Normal breath sounds. No stridor. No wheezing, rhonchi or rales.  Chest:     Chest wall: No tenderness.  Musculoskeletal:         General: No swelling, tenderness, deformity or signs of injury. Normal range of motion.     Right lower leg: No edema.     Left lower leg: No edema.  Skin:    General: Skin is warm and dry.     Capillary Refill: Capillary refill takes less than 2 seconds.     Coloration: Skin is not jaundiced or pale.     Findings: No bruising, erythema, lesion or rash.  Neurological:     General: No focal deficit present.     Mental Status: She is alert and oriented to person, place, and time. Mental status is at baseline.     Cranial Nerves: No cranial nerve deficit.     Sensory: No sensory deficit.     Motor: No weakness.     Coordination: Coordination normal.  Psychiatric:        Mood and Affect:  Mood normal.        Behavior: Behavior normal.        Thought Content: Thought content normal.        Judgment: Judgment normal.     No results found for any visits on 07/20/22.  Assessment & Plan     Problem List Items Addressed This Visit       Other   Anxiety    Chronic, stable Has maintained on Zoloft for 50 mg Denies current complaints      Mixed hyperlipidemia - Primary    Chronic, previously stable Continue Crestor 10 mg Repeat LP I continue to recommend diet low in saturated fat and regular exercise - 30 min at least 5 times per week       Relevant Orders   CBC with Differential/Platelet   Comprehensive Metabolic Panel (CMET)   Lipid panel     Return in about 1 year (around 07/21/2023) for annual examination.      Vonna Kotyk, FNP, have reviewed all documentation for this visit. The documentation on 07/20/22 for the exam, diagnosis, procedures, and orders are all accurate and complete.    Gwyneth Sprout, [place] [phone] (phone) [phone] (fax)  [place]

## 2022-07-20 NOTE — Assessment & Plan Note (Signed)
Chronic, previously stable Continue Crestor 10 mg Repeat LP I continue to recommend diet low in saturated fat and regular exercise - 30 min at least 5 times per week

## 2022-07-21 LAB — CBC WITH DIFFERENTIAL/PLATELET
Basophils Absolute: 0 10*3/uL (ref 0.0–0.2)
Basos: 1 %
EOS (ABSOLUTE): 0.1 10*3/uL (ref 0.0–0.4)
Eos: 1 %
Hematocrit: 41.6 % (ref 34.0–46.6)
Hemoglobin: 14.1 g/dL (ref 11.1–15.9)
Immature Grans (Abs): 0 10*3/uL (ref 0.0–0.1)
Immature Granulocytes: 0 %
Lymphocytes Absolute: 3 10*3/uL (ref 0.7–3.1)
Lymphs: 37 %
MCH: 30.7 pg (ref 26.6–33.0)
MCHC: 33.9 g/dL (ref 31.5–35.7)
MCV: 91 fL (ref 79–97)
Monocytes Absolute: 0.8 10*3/uL (ref 0.1–0.9)
Monocytes: 10 %
Neutrophils Absolute: 4.3 10*3/uL (ref 1.4–7.0)
Neutrophils: 51 %
Platelets: 271 10*3/uL (ref 150–450)
RBC: 4.59 x10E6/uL (ref 3.77–5.28)
RDW: 12.2 % (ref 11.7–15.4)
WBC: 8.2 10*3/uL (ref 3.4–10.8)

## 2022-07-21 LAB — COMPREHENSIVE METABOLIC PANEL
ALT: 18 IU/L (ref 0–32)
AST: 20 IU/L (ref 0–40)
Albumin/Globulin Ratio: 2 (ref 1.2–2.2)
Albumin: 4.3 g/dL (ref 3.8–4.8)
Alkaline Phosphatase: 53 IU/L (ref 44–121)
BUN/Creatinine Ratio: 16 (ref 12–28)
BUN: 14 mg/dL (ref 8–27)
Bilirubin Total: 0.3 mg/dL (ref 0.0–1.2)
CO2: 24 mmol/L (ref 20–29)
Calcium: 9.4 mg/dL (ref 8.7–10.3)
Chloride: 105 mmol/L (ref 96–106)
Creatinine, Ser: 0.85 mg/dL (ref 0.57–1.00)
Globulin, Total: 2.2 g/dL (ref 1.5–4.5)
Glucose: 76 mg/dL (ref 70–99)
Potassium: 3.4 mmol/L — ABNORMAL LOW (ref 3.5–5.2)
Sodium: 143 mmol/L (ref 134–144)
Total Protein: 6.5 g/dL (ref 6.0–8.5)
eGFR: 73 mL/min/{1.73_m2} (ref >59)

## 2022-07-21 LAB — LIPID PANEL
Chol/HDL Ratio: 2.4 ratio (ref 0.0–4.4)
Cholesterol, Total: 153 mg/dL (ref 100–199)
HDL: 64 mg/dL (ref >39)
LDL Chol Calc (NIH): 65 mg/dL (ref 0–99)
Triglycerides: 138 mg/dL (ref 0–149)
VLDL Cholesterol Cal: 24 mg/dL (ref 5–40)

## 2022-07-21 NOTE — Progress Notes (Signed)
Labs are normal and stable with exception of slightly low potassium. Ensure you are eating a balanced diet.  Brooke Mcdowell, [place] [address] [place], [place] [address] [phone] (phone) [phone] (fax) [place]

## 2022-08-03 ENCOUNTER — Other Ambulatory Visit: Payer: Self-pay | Admitting: Family Medicine

## 2022-08-03 DIAGNOSIS — F419 Anxiety disorder, unspecified: Secondary | ICD-10-CM

## 2022-08-03 DIAGNOSIS — Z79890 Hormone replacement therapy (postmenopausal): Secondary | ICD-10-CM

## 2022-08-04 DIAGNOSIS — J45909 Unspecified asthma, uncomplicated: Secondary | ICD-10-CM | POA: Diagnosis not present

## 2022-08-04 NOTE — Telephone Encounter (Signed)
Requested Prescriptions  Pending Prescriptions Disp Refills   sertraline (ZOLOFT) 50 MG tablet [Pharmacy Med Name: SERTRALINE '50MG'$  TABLETS] 90 tablet 1    Sig: TAKE 1 TABLET(50 MG) BY MOUTH AT BEDTIME     Psychiatry:  Antidepressants - SSRI - sertraline Passed - 08/03/2022  9:17 PM      Passed - AST in normal range and within 360 days    AST  Date Value Ref Range Status  07/20/2022 20 0 - 40 IU/L Final         Passed - ALT in normal range and within 360 days    ALT  Date Value Ref Range Status  07/20/2022 18 0 - 32 IU/L Final         Passed - Completed PHQ-2 or PHQ-9 in the last 360 days      Passed - Valid encounter within last 6 months    Recent Outpatient Visits           2 weeks ago Mixed hyperlipidemia   [facility] Tally Joe T, FNP   7 months ago Bacterial sinusitis   [facility] Gwyneth Sprout, FNP   1 year ago Screening for colon cancer   [facility] Gwyneth Sprout, FNP   1 year ago Acute non-recurrent pansinusitis   [place] Flinchum, Kelby Aline, [place]   2 years ago Annual physical exam   [facility] Trinna Post, [place]       Future Appointments             In 11 months Gwyneth Sprout, [place], [facility]             medroxyPROGESTERone (PROVERA) 2.5 MG tablet [employer] Med Name: MEDROXYPROGESTERONE 2.'5MG'$  TABLETS] 90 tablet 3    Sig: TAKE 1 TABLET(2.5 MG) BY MOUTH DAILY     OB/GYN:  Progestins Passed - 08/03/2022  9:17 PM      Passed - Last BP in normal range    BP Readings from Last 1 Encounters:  07/20/22 133/85         Passed - Valid encounter within last 12 months    Recent Outpatient Visits           2 weeks ago Mixed hyperlipidemia   [facility] Tally Joe T, FNP   7 months ago Bacterial sinusitis   [facility] Gwyneth Sprout, FNP   1 year ago Screening for colon cancer   [facility] Gwyneth Sprout, FNP   1 year ago Acute non-recurrent pansinusitis   [employer] Flinchum, Kelby Aline, [place]   2 years ago Annual physical exam   [facility] Trinna Post, [place]       Future Appointments             In 11 months Gwyneth Sprout, [place], [place] - Patient is not a smoker       estradiol (ESTRACE) 1 MG tablet [Pharmacy Med Name: ESTRADIOL '1MG'$  TABLETS] 90 tablet 3    Sig: TAKE 1 TABLET(1 MG) BY MOUTH DAILY     OB/GYN:  Estrogens Passed - 08/03/2022  9:17 PM      Passed - Mammogram is up-to-date per Health Maintenance      Passed - Last BP in normal range    BP Readings from Last 1 Encounters:  07/20/22  133/85         Passed - Valid encounter within last 12 months    Recent Outpatient Visits           2 weeks ago Mixed hyperlipidemia   [facility] Tally Joe T, FNP   7 months ago Bacterial sinusitis   [facility] Gwyneth Sprout, FNP   1 year ago Screening for colon cancer   [facility] Gwyneth Sprout, FNP   1 year ago Acute non-recurrent pansinusitis   [facility] Flinchum, Kelby Aline, FNP   2 years ago Annual physical exam   [facility] Trinna Post, PA-C       Future Appointments             In 11 months Gwyneth Sprout, [place], [place]

## 2022-08-18 DIAGNOSIS — H26491 Other secondary cataract, right eye: Secondary | ICD-10-CM | POA: Diagnosis not present

## 2022-08-29 DIAGNOSIS — R509 Fever, unspecified: Secondary | ICD-10-CM | POA: Diagnosis not present

## 2022-08-29 DIAGNOSIS — K219 Gastro-esophageal reflux disease without esophagitis: Secondary | ICD-10-CM | POA: Diagnosis not present

## 2022-08-29 DIAGNOSIS — R058 Other specified cough: Secondary | ICD-10-CM | POA: Diagnosis not present

## 2022-08-29 DIAGNOSIS — J301 Allergic rhinitis due to pollen: Secondary | ICD-10-CM | POA: Diagnosis not present

## 2022-08-29 DIAGNOSIS — Z03818 Encounter for observation for suspected exposure to other biological agents ruled out: Secondary | ICD-10-CM | POA: Diagnosis not present

## 2022-08-30 DIAGNOSIS — M4800 Spinal stenosis, site unspecified: Secondary | ICD-10-CM | POA: Insufficient documentation

## 2022-08-30 DIAGNOSIS — G43719 Chronic migraine without aura, intractable, without status migrainosus: Secondary | ICD-10-CM | POA: Diagnosis not present

## 2022-08-30 DIAGNOSIS — M542 Cervicalgia: Secondary | ICD-10-CM | POA: Diagnosis not present

## 2022-08-30 DIAGNOSIS — M5481 Occipital neuralgia: Secondary | ICD-10-CM | POA: Diagnosis not present

## 2022-09-03 DIAGNOSIS — J45909 Unspecified asthma, uncomplicated: Secondary | ICD-10-CM | POA: Diagnosis not present

## 2022-10-02 ENCOUNTER — Other Ambulatory Visit: Payer: Self-pay | Admitting: Family Medicine

## 2022-10-02 DIAGNOSIS — G4700 Insomnia, unspecified: Secondary | ICD-10-CM

## 2022-10-04 DIAGNOSIS — J45909 Unspecified asthma, uncomplicated: Secondary | ICD-10-CM | POA: Diagnosis not present

## 2022-10-17 ENCOUNTER — Other Ambulatory Visit: Payer: Self-pay | Admitting: Family Medicine

## 2022-10-17 DIAGNOSIS — E7800 Pure hypercholesterolemia, unspecified: Secondary | ICD-10-CM

## 2022-10-18 DIAGNOSIS — M1811 Unilateral primary osteoarthritis of first carpometacarpal joint, right hand: Secondary | ICD-10-CM | POA: Diagnosis not present

## 2022-10-21 ENCOUNTER — Ambulatory Visit: Payer: Self-pay | Admitting: *Deleted

## 2022-10-21 ENCOUNTER — Encounter: Payer: Self-pay | Admitting: Physician Assistant

## 2022-10-21 ENCOUNTER — Ambulatory Visit (INDEPENDENT_AMBULATORY_CARE_PROVIDER_SITE_OTHER): Payer: PPO | Admitting: Physician Assistant

## 2022-10-21 VITALS — BP 116/69 | HR 72 | Temp 98.9°F | Ht 61.0 in | Wt 116.0 lb

## 2022-10-21 DIAGNOSIS — R6889 Other general symptoms and signs: Secondary | ICD-10-CM

## 2022-10-21 DIAGNOSIS — J029 Acute pharyngitis, unspecified: Secondary | ICD-10-CM

## 2022-10-21 DIAGNOSIS — R059 Cough, unspecified: Secondary | ICD-10-CM

## 2022-10-21 DIAGNOSIS — J45901 Unspecified asthma with (acute) exacerbation: Secondary | ICD-10-CM | POA: Diagnosis not present

## 2022-10-21 DIAGNOSIS — R519 Headache, unspecified: Secondary | ICD-10-CM

## 2022-10-21 LAB — POCT INFLUENZA A/B
Influenza A, POC: NEGATIVE
Influenza B, POC: NEGATIVE

## 2022-10-21 MED ORDER — PREDNISONE 20 MG PO TABS: 20.0000 mg | ORAL_TABLET | Freq: Every day | ORAL | 0 refills | Status: DC

## 2022-10-21 MED ORDER — AZITHROMYCIN 250 MG PO TABS: ORAL_TABLET | ORAL | 0 refills | Status: AC

## 2022-10-21 NOTE — Telephone Encounter (Signed)
Summary: cough / rx req   The patient has experienced a sore throat, sinus congestion, cough and headache since yesterday 10/20/22  The patient has been in close contact with their husband that has tested positive for the flu  The patient would ike to be prescribed something for their discomfort  Please contact further when possible     Reason for Disposition . [1] Continuous (nonstop) coughing interferes with work or school AND [2] no improvement using cough treatment per Care Advice  Answer Assessment - Initial Assessment Questions 1. ONSET: "When did the cough begin?"      Started Tuesday with sore throat 2. SEVERITY: "How bad is the cough today?"      -coughing- but not getting phlegm out 3. SPUTUM: "Describe the color of your sputum" (none, dry cough; clear, white, yellow, green)     no 4. HEMOPTYSIS: "Are you coughing up any blood?" If so ask: "How much?" (flecks, streaks, tablespoons, etc.)     no 5. DIFFICULTY BREATHING: "Are you having difficulty breathing?" If Yes, ask: "How bad is it?" (e.g., mild, moderate, severe)    - MILD: No SOB at rest, mild SOB with walking, speaks normally in sentences, can lie down, no retractions, pulse < 100.    - MODERATE: SOB at rest, SOB with minimal exertion and prefers to sit, cannot lie down flat, speaks in phrases, mild retractions, audible wheezing, pulse 100-120.    - SEVERE: Very SOB at rest, speaks in single words, struggling to breathe, sitting hunched forward, retractions, pulse > 120      No- Os sat 935 6. FEVER: "Do you have a fever?" If Yes, ask: "What is your temperature, how was it measured, and when did it start?"     no 7. CARDIAC HISTORY: "Do you have any history of heart disease?" (e.g., heart attack, congestive heart failure)      no 8. LUNG HISTORY: "Do you have any history of lung disease?"  (e.g., pulmonary embolus, asthma, emphysema)     no 9. PE RISK FACTORS: "Do you have a history of blood clots?" (or: recent major  surgery, recent prolonged travel, bedridden)     no 10. OTHER SYMPTOMS: "Do you have any other symptoms?" (e.g., runny nose, wheezing, chest pain)       Headache, dizziness  12. TRAVEL: "Have you traveled out of the country in the last month?" (e.g., travel history, exposures)       Husband has upper respiratory infection  Protocols used: Cough - Acute Productive-A-AH

## 2022-10-21 NOTE — Progress Notes (Signed)
Established patient visit   Patient: Brooke Mcdowell   DOB: 01-07-1951   72 y.o. Female  MRN: [id number] Visit Date: 10/21/2022  Today's healthcare provider: Mardene Speak, PA-C   CC: cough  Subjective    Cough This is a new problem. The current episode started in the past 7 days. The problem has been gradually worsening. The problem occurs every few minutes. The cough is Productive of sputum (yellow). Associated symptoms include headaches, postnasal drip, a sore throat and wheezing. Associated symptoms comments: dizziness. Treatments tried: ibuprofen. The treatment provided no relief.   HPI   Negative at home covid test today  Last edited by Person, Ival Bible, CMA on 10/21/2022  3:23 PM.      Pt states that she was diagnosed with Asthma by Dr. Vella Kohler a year ago Per chart review, pt saw dr. Vella Kohler in 2020 for asthma exacerbation. She believes that she has been having similar symptoms. Her husband was diagnosed with URI a few days before she started to have her symptoms and was treated successfully with antibiotics Per pt, her ox sat was 93.5 at home   Medications: Outpatient Medications Prior to Visit  Medication Sig   azithromycin (ZITHROMAX) 250 MG tablet Take 250 mg by mouth as directed.   EQ GENTLE LUBRICANT 0.3 % SOLN Place 1 drop into both eyes at bedtime as needed (dry/irritated eyes.).   estradiol (ESTRACE) 1 MG tablet TAKE 1 TABLET(1 MG) BY MOUTH DAILY   fluticasone (FLONASE) 50 MCG/ACT nasal spray Place 2 sprays into both nostrils daily.   Galcanezumab-gnlm (EMGALITY) 120 MG/ML SOAJ Inject into the skin.   Magnesium 250 MG TABS Take 500 mg by mouth every evening.    medroxyPROGESTERone (PROVERA) 2.5 MG tablet TAKE 1 TABLET(2.5 MG) BY MOUTH DAILY   meloxicam (MOBIC) 15 MG tablet TAKE 1 TABLET(15 MG) BY MOUTH DAILY   Multiple Vitamins-Minerals (ADULT GUMMY PO) Take 2 tablets by mouth every evening. Vitafusion MultiVites Gummy   naratriptan (AMERGE) 2.5 MG tablet  Take by mouth.   NURTEC 75 MG TBDP Take by mouth daily.   omeprazole (PRILOSEC) 40 MG capsule Take 1 capsule (40 mg total) by mouth daily.   rosuvastatin (CRESTOR) 10 MG tablet TAKE 1 TABLET(10 MG) BY MOUTH DAILY   sertraline (ZOLOFT) 50 MG tablet TAKE 1 TABLET(50 MG) BY MOUTH AT BEDTIME   zolpidem (AMBIEN) 10 MG tablet TAKE 1 TABLET(10 MG) BY MOUTH AT BEDTIME AS NEEDED FOR SLEEP   [DISCONTINUED] predniSONE (STERAPRED UNI-PAK 21 TAB) 10 MG (21) TBPK tablet Take as written with morning meal.   [DISCONTINUED] EMGALITY 120 MG/ML SOAJ SMARTSIG:1 Milliliter(s) SUB-Q Every 4 Weeks   No facility-administered medications prior to visit.    Review of Systems  HENT:  Positive for postnasal drip and sore throat.   Respiratory:  Positive for cough and wheezing.   Neurological:  Positive for headaches.       Objective    BP 116/69   Pulse 72   Temp 98.9 F (37.2 C) (Oral)   Ht '5\' 1"'$  (1.549 m)   Wt 116 lb (52.6 kg)   SpO2 100%   BMI 21.92 kg/m    Physical Exam Vitals reviewed.  Constitutional:      General: She is in acute distress.     Appearance: Normal appearance. She is well-developed and normal weight. She is not diaphoretic.  HENT:     Head: Normocephalic and atraumatic.     Right Ear: Tympanic membrane, ear  canal and external ear normal.     Left Ear: Tympanic membrane, ear canal and external ear normal.     Nose: Congestion and rhinorrhea present.     Mouth/Throat:     Pharynx: Posterior oropharyngeal erythema (mild) present.     Comments: Postnasal drip  Eyes:     General: No scleral icterus.       Right eye: No discharge.        Left eye: No discharge.     Extraocular Movements: Extraocular movements intact.     Conjunctiva/sclera: Conjunctivae normal.     Pupils: Pupils are equal, round, and reactive to light.  Neck:     Thyroid: No thyromegaly.  Cardiovascular:     Rate and Rhythm: Normal rate and regular rhythm.     Pulses: Normal pulses.     Heart sounds:  Normal heart sounds. No murmur heard. Pulmonary:     Effort: Pulmonary effort is normal. No respiratory distress.     Breath sounds: Wheezing present. No rales.  Musculoskeletal:        General: Normal range of motion.     Cervical back: Normal range of motion and neck supple.     Right lower leg: No edema.     Left lower leg: No edema.  Lymphadenopathy:     Cervical: No cervical adenopathy.  Skin:    General: Skin is warm and dry.     Findings: No rash.  Neurological:     Mental Status: She is alert and oriented to person, place, and time. Mental status is at baseline.  Psychiatric:        Behavior: Behavior normal.        Thought Content: Thought content normal.        Judgment: Judgment normal.      Results for orders placed or performed in visit on 10/21/22  POCT Influenza A/B  Result Value Ref Range   Influenza A, POC Negative Negative   Influenza B, POC Negative Negative    Assessment & Plan     1. Flu-like symptoms - POCT Influenza A/B negative Could be due to  Exacerbation of asthma vs Bronchitis, pneumonitis Acute P exposure to viral infection through her husband Wheezing and rhonchi on PE Low grade fever? Advised to start albuterol inhaler and nebulizer/Rx by Dr. Vella Kohler? Per chart review, she saw Dr. Vella Kohler in 2020 for cough variant asthma Continue symptomatic treatment with nasal saline rinse, warm salt gargle, hot tea with honey, Flonase OTC, adequate hydration, air humidifier - azithromycin (ZITHROMAX) 250 MG tablet; Take 2 tablets on day 1, then 1 tablet daily on days 2 through 5  Dispense: 6 tablet; Refill: 0 - predniSONE (DELTASONE) 20 MG tablet; Take 1 tablet (20 mg total) by mouth daily with breakfast.  Dispense: 10 tablet; Refill: 0 Will reassess next Friday Pt was advised to contact her pulmonologist/ Dr. Vella Kohler for reevaluation  The patient was advised to call back or seek an in-person evaluation if the symptoms worsen or if the condition  fails to improve as anticipated.  I discussed the assessment and treatment plan with the patient. The patient was provided an opportunity to ask questions and all were answered. The patient agreed with the plan and demonstrated an understanding of the instructions.  The entirety of the information documented in the History of Present Illness, Review of Systems and Physical Exam were personally obtained by me. Portions of this information were initially documented by the [facility] and reviewed by me  for thoroughness and accuracy.  Mardene Speak, [facility], [place] [phone] (phone) [phone] (fax)   [place]

## 2022-10-21 NOTE — Telephone Encounter (Signed)
  Chief Complaint: sinus symptoms- sore throat Symptoms: cough, congestion, headache, dizziness Frequency: symptoms started Tuesday Pertinent Negatives: Patient denies fever Disposition: '[]'$ ED /'[]'$ Urgent Care (no appt availability in office) / '[x]'$ Appointment(In office/virtual)/ '[]'$  [facility]/ '[]'$ Home Care/ '[]'$ Refused Recommended Disposition /'[]'$ [facility] [place] [facility]/ '[]'$  Follow-up with PCP Additional Notes: Patient states her husband is sick with URI and being treated with antibiotics

## 2022-10-25 ENCOUNTER — Emergency Department: Payer: PPO

## 2022-10-25 ENCOUNTER — Emergency Department
Admission: EM | Admit: 2022-10-25 | Discharge: 2022-10-25 | Disposition: A | Discharge status: Home / Self Care | Payer: PPO | Attending: Emergency Medicine | Admitting: Emergency Medicine

## 2022-10-25 ENCOUNTER — Encounter: Payer: Self-pay | Admitting: Emergency Medicine

## 2022-10-25 DIAGNOSIS — R059 Cough, unspecified: Secondary | ICD-10-CM | POA: Diagnosis not present

## 2022-10-25 DIAGNOSIS — B338 Other specified viral diseases: Secondary | ICD-10-CM

## 2022-10-25 DIAGNOSIS — Z7952 Long term (current) use of systemic steroids: Secondary | ICD-10-CM | POA: Diagnosis not present

## 2022-10-25 DIAGNOSIS — Z7951 Long term (current) use of inhaled steroids: Secondary | ICD-10-CM | POA: Insufficient documentation

## 2022-10-25 DIAGNOSIS — J988 Other specified respiratory disorders: Secondary | ICD-10-CM | POA: Insufficient documentation

## 2022-10-25 DIAGNOSIS — B974 Respiratory syncytial virus as the cause of diseases classified elsewhere: Secondary | ICD-10-CM | POA: Insufficient documentation

## 2022-10-25 DIAGNOSIS — R1110 Vomiting, unspecified: Secondary | ICD-10-CM | POA: Diagnosis not present

## 2022-10-25 DIAGNOSIS — J45909 Unspecified asthma, uncomplicated: Secondary | ICD-10-CM | POA: Insufficient documentation

## 2022-10-25 DIAGNOSIS — Z20822 Contact with and (suspected) exposure to covid-19: Secondary | ICD-10-CM | POA: Insufficient documentation

## 2022-10-25 LAB — RESP PANEL BY RT-PCR (RSV, FLU A&B, COVID)  RVPGX2
Influenza A by PCR: NEGATIVE
Influenza B by PCR: NEGATIVE
Resp Syncytial Virus by PCR: POSITIVE — AB
SARS Coronavirus 2 by RT PCR: NEGATIVE

## 2022-10-25 MED ORDER — HYDROCOD POLI-CHLORPHE POLI ER 10-8 MG/5ML PO SUER
5.0000 mL | Freq: Once | ORAL | Status: AC
  Administered 2022-10-25: 5 mL via ORAL
  Filled 2022-10-25: qty 5

## 2022-10-25 MED ORDER — ONDANSETRON 4 MG PO TBDP
4.0000 mg | ORAL_TABLET | Freq: Once | ORAL | Status: AC
  Administered 2022-10-25: 4 mg via ORAL
  Filled 2022-10-25: qty 1

## 2022-10-25 MED ORDER — HYDROCOD POLI-CHLORPHE POLI ER 10-8 MG/5ML PO SUER: 5.0000 mL | Freq: Every evening | ORAL | 0 refills | Status: DC | PRN

## 2022-10-25 MED ORDER — ONDANSETRON 4 MG PO TBDP: 4.0000 mg | ORAL_TABLET | Freq: Four times a day (QID) | ORAL | 0 refills | Status: DC | PRN

## 2022-10-25 MED ORDER — ACETAMINOPHEN 500 MG PO TABS
1000.0000 mg | ORAL_TABLET | Freq: Once | ORAL | Status: AC
  Administered 2022-10-25: 1000 mg via ORAL
  Filled 2022-10-25: qty 2

## 2022-10-25 NOTE — ED Provider Notes (Signed)
[facility] Provider Note    Event Date/Time   First MD Initiated Contact with Patient 10/25/22 [phone]     (approximate)   History   Cough   HPI  Brooke Mcdowell is a 72 y.o. female with history of asthma who presents to the emergency department with several days of cough, body aches, fever.  Seen by [facility] family practice on January 19 for the same and was given azithromycin and prednisone.  Reports she has had posttussive emesis.   History provided by patient.    Past Medical History:  Diagnosis Date   Ankle pain, left    previous strain   Arthritis    hands and back   Arthropathy of hand    Dysthymic disorder    Esophageal reflux    History of kidney stones    Insomnia    Lumbar herniated disc    Gets injections in back 3 times a year   Migraine    almost evrery day   Osteoporosis    Panic disorder    Pure hypercholesterolemia    Vertigo    with sinus infection   Vitamin D deficiency     Past Surgical History:  Procedure Laterality Date   CATARACT EXTRACTION W/PHACO Right 12/06/2018   Procedure: CATARACT EXTRACTION PHACO AND INTRAOCULAR LENS PLACEMENT (IOC)-RIGHT;  Surgeon: Marchia Meiers, MD;  Location: [facility] ORS;  Service: Ophthalmology;  Laterality: Right;  [place] 01:28.9 CDE 11.98 Fluid Pack Lot # [id number]   CATARACT EXTRACTION W/PHACO Left 09/15/2020   Procedure: CATARACT EXTRACTION PHACO AND INTRAOCULAR LENS PLACEMENT (IOC) LEFT 8.63 00:52.2;  Surgeon: Birder Robson, MD;  Location: [facility];  Service: Ophthalmology;  Laterality: Left;   clot removal from right sinus after surgery to remove tumor in [place] N/A 07/22/2021   Procedure: COLONOSCOPY WITH PROPOFOL;  Surgeon: Jonathon Bellows, MD;  Location: [facility];  Service: Gastroenterology;  Laterality: N/A;   LITHOTRIPSY  [phone]   renal stones   TUBAL LIGATION     TUMOR REMOVAL     behind right ete in sinuses- 1995 Dr. Pryor Ochoa     MEDICATIONS:  Prior to Admission medications   Medication Sig Start Date End Date Taking? Authorizing Provider  chlorpheniramine-HYDROcodone (TUSSIONEX) 10-8 MG/5ML Take 5 mLs by mouth at bedtime as needed for cough. 10/25/22  Yes Naftali Carchi, Cyril Mourning N, DO  ondansetron (ZOFRAN-ODT) 4 MG disintegrating tablet Take 1 tablet (4 mg total) by mouth every 6 (six) hours as needed for nausea or vomiting. 10/25/22  Yes [place] Whedbee, Cyril Mourning N, DO  azithromycin (ZITHROMAX) 250 MG tablet Take 250 mg by mouth as directed. 08/29/22   [provider]  azithromycin (ZITHROMAX) 250 MG tablet Take 2 tablets on day 1, then 1 tablet daily on days 2 through 5 10/21/22 10/26/22  Ostwalt, Janna, PA-C  EQ GENTLE LUBRICANT 0.3 % SOLN Place 1 drop into both eyes at bedtime as needed (dry/irritated eyes.).    [provider]  estradiol (ESTRACE) 1 MG tablet TAKE 1 TABLET(1 MG) BY MOUTH DAILY 08/04/22   Gwyneth Sprout, FNP  fluticasone (FLONASE) 50 MCG/ACT nasal spray Place 2 sprays into both nostrils daily. 12/14/21   Gwyneth Sprout, FNP  Galcanezumab-gnlm [facility]) 120 MG/ML SOAJ Inject into the skin. 10/18/21   [provider]  Magnesium 250 MG TABS Take 500 mg by mouth every evening.     [provider]  medroxyPROGESTERone (PROVERA) 2.5 MG tablet TAKE 1 TABLET(2.5  MG) BY MOUTH DAILY 08/04/22   Tally Joe T, FNP  meloxicam (MOBIC) 15 MG tablet TAKE 1 TABLET(15 MG) BY MOUTH DAILY 01/25/22   Gwyneth Sprout, FNP  Multiple Vitamins-Minerals (ADULT GUMMY PO) Take 2 tablets by mouth every evening. Vitafusion MultiVites Gummy    [provider]  naratriptan (AMERGE) 2.5 MG tablet Take by mouth. 07/01/20   [provider]  NURTEC 75 MG TBDP Take by mouth daily. 10/18/21   [provider]  omeprazole (PRILOSEC) 40 MG capsule Take 1 capsule (40 mg total) by mouth daily. 07/15/21   Gwyneth Sprout, FNP  predniSONE (DELTASONE) 20 MG tablet Take 1 tablet (20 mg total) by mouth daily with  breakfast. 10/21/22   Ostwalt, Letitia Libra, PA-C  rosuvastatin (CRESTOR) 10 MG tablet TAKE 1 TABLET(10 MG) BY MOUTH DAILY 10/17/22   Tally Joe T, FNP  sertraline (ZOLOFT) 50 MG tablet TAKE 1 TABLET(50 MG) BY MOUTH AT BEDTIME 08/04/22   Gwyneth Sprout, FNP  zolpidem (AMBIEN) 10 MG tablet TAKE 1 TABLET(10 MG) BY MOUTH AT BEDTIME AS NEEDED FOR SLEEP 10/04/22   Gwyneth Sprout, FNP    Physical Exam   Triage Vital Signs: ED Triage Vitals  Enc Vitals Group     BP 10/25/22 0107 (!) 144/80     Pulse Rate 10/25/22 0107 83     Resp 10/25/22 0107 19     Temp 10/25/22 0107 99.6 F (37.6 C)     Temp Source 10/25/22 0107 Oral     SpO2 10/25/22 0107 98 %     Weight 10/25/22 0108 115 lb (52.2 kg)     Height 10/25/22 0108 '5\' 1"'$  (1.549 m)     Head Circumference --      Peak Flow --      Pain Score 10/25/22 0108 5     Pain Loc --      Pain Edu? --      Excl. in [place]? --     Most recent vital signs: Vitals:   10/25/22 0107  BP: (!) 144/80  Pulse: 83  Resp: 19  Temp: 99.6 F (37.6 C)  SpO2: 98%    CONSTITUTIONAL: Alert, responds appropriately to questions. Well-appearing; well-nourished HEAD: Normocephalic, atraumatic EYES: Conjunctivae clear, pupils appear equal, sclera nonicteric ENT: normal nose; moist mucous membranes NECK: Supple, normal ROM CARD: RRR; S1 and S2 appreciated RESP: Normal chest excursion without splinting or tachypnea; breath sounds clear and equal bilaterally; no wheezes, no rhonchi, no rales, no hypoxia or respiratory distress, speaking full sentences ABD/GI: Non-distended; soft, non-tender, no rebound, no guarding, no peritoneal signs BACK: The back appears normal EXT: Normal ROM in all joints; no deformity noted, no edema SKIN: Normal color for age and race; warm; no rash on exposed skin NEURO: Moves all extremities equally, normal speech PSYCH: The patient's mood and manner are appropriate.   ED Results / Procedures / Treatments   LABS: (all labs ordered are listed,  but only abnormal results are displayed) Labs Reviewed  RESP PANEL BY RT-PCR (RSV, FLU A&B, COVID)  [id number] - Abnormal; Notable for the following components:      Result Value   Resp Syncytial Virus by PCR POSITIVE (*)    All other components within normal limits     EKG:   RADIOLOGY: My personal review and interpretation of imaging: Chest x-ray clear.  I have personally reviewed all radiology reports.   DG Chest 2 View  Result Date: 10/25/2022 CLINICAL DATA:  Cough. EXAM:  CHEST - 2 VIEW COMPARISON:  Chest radiograph dated 07/17/2017. FINDINGS: No focal consolidation, pleural effusion, or pneumothorax. The cardiac silhouette is within normal limits. No acute osseous pathology. IMPRESSION: No active cardiopulmonary disease. Electronically Signed   By: Anner Crete M.D.   On: 10/25/2022 02:34     PROCEDURES:  Critical Care performed: No    Procedures    IMPRESSION / MDM / ASSESSMENT AND PLAN / ED COURSE  I reviewed the triage vital signs and the nursing notes.    Patient here with cough, body aches, fever, posttussive emesis for several days.  Already on steroids and antibiotics.     DIFFERENTIAL DIAGNOSIS (includes but not limited to):   Viral URI, pneumonia, asthma exacerbation   Patient's presentation is most consistent with acute complicated illness / injury requiring diagnostic workup.   PLAN: Patient is positive for RSV.  COVID, flu negative.  Chest x-ray reviewed and interpreted by myself and the radiologist and shows no acute abnormality.  Lungs clear to auscultation here with no hypoxia with rest or with ambulation.  Sats stayed above 96% with ambulation.  No increased work of breathing.  Does not appear septic, toxic today.  Discussed with patient that this is a viral illness and there is no definitive treatment other than supportive care.  Will discharge with Tussionex to use at night and Zofran.  Discussed supportive care instructions, return  precautions.   MEDICATIONS GIVEN IN ED: Medications  chlorpheniramine-HYDROcodone (TUSSIONEX) 10-8 MG/5ML suspension 5 mL (has no administration in time range)  ondansetron (ZOFRAN-ODT) disintegrating tablet 4 mg (has no administration in time range)  acetaminophen (TYLENOL) tablet 1,000 mg (has no administration in time range)     ED COURSE:  At this time, I do not feel there is any life-threatening condition present. I reviewed all nursing notes, vitals, pertinent previous records.  All lab and urine results, EKGs, imaging ordered have been independently reviewed and interpreted by myself.  I reviewed all available radiology reports from any imaging ordered this visit.  Based on my assessment, I feel the patient is safe to be discharged home without further emergent workup and can continue workup as an outpatient as needed. Discussed all findings, treatment plan as well as usual and customary return precautions.  They verbalize understanding and are comfortable with this plan.  Outpatient follow-up has been provided as needed.  All questions have been answered.    CONSULTS:  none   OUTSIDE RECORDS REVIEWED: Reviewed PCP note on 10/21/2022.       FINAL CLINICAL IMPRESSION(S) / ED DIAGNOSES   Final diagnoses:  RSV infection  Post-tussive emesis     Rx / DC Orders   ED Discharge Orders          Ordered    chlorpheniramine-HYDROcodone (TUSSIONEX) 10-8 MG/5ML  At bedtime PRN        10/25/22 0416    ondansetron (ZOFRAN-ODT) 4 MG disintegrating tablet  Every 6 hours PRN        10/25/22 0416             Note:  This document was prepared using Dragon voice recognition software and may include unintentional dictation errors.   Gracyn Allor, Delice Bison, DO 10/25/22 [phone]

## 2022-10-25 NOTE — ED Triage Notes (Signed)
Pt presents via POV with complaints of cough, nasal congestion, and intermittent chills since last Wednesday. Pt was seen by her PCP who started her on a Zpack - no imaging obtained at the appointment. She states having a strong productive cough. Denies CP or SOB.

## 2022-10-25 NOTE — ED Notes (Addendum)
Pt ambulatory around nursing station while on O2 monitoring O2 stayed between 95-98% on RA. HR remained in the 80s. Pt denies feeling increased shortness of breath   Provider notified

## 2022-10-25 NOTE — Discharge Instructions (Signed)
You may take over-the-counter Tylenol 1000 mg every 6 hours as needed for fever, pain.  RSV is a viral illness and does not need antibiotics.  Your chest x-ray today was clear.  You begin having shortness of breath, wheezing, blue lips or blue fingertips, please return the emergency department.

## 2022-10-26 ENCOUNTER — Ambulatory Visit: Payer: PPO | Admitting: Physician Assistant

## 2022-11-01 ENCOUNTER — Telehealth: Payer: Self-pay

## 2022-11-01 NOTE — Telephone Encounter (Signed)
        Patient  visited [facility] on 1/23     Telephone encounter attempt :   1st A HIPAA compliant voice message was left requesting a return call.  Instructed patient to call back    [facility] [phone] [address], [place], [place] [address] Phone: [phone] Email: [email or web address]

## 2022-11-02 ENCOUNTER — Telehealth: Payer: Self-pay

## 2022-11-02 NOTE — Telephone Encounter (Signed)
     Patient  visit on 1/23  at [facility]    Have you been able to follow up with your primary care physician? Plans to but hasn't yet   The patient was or was not able to obtain any needed medicine or equipment. YES   Are there diet recommendations that you are having difficulty following? NA   Patient expresses understanding of discharge instructions and education provided has no other needs at this time. [place], [place] [phone] [address], [place], [place] [address] Phone: [phone] Email: [email or web address]

## 2022-11-04 ENCOUNTER — Ambulatory Visit (INDEPENDENT_AMBULATORY_CARE_PROVIDER_SITE_OTHER): Payer: PPO | Admitting: Physician Assistant

## 2022-11-04 ENCOUNTER — Ambulatory Visit: Payer: Self-pay | Admitting: *Deleted

## 2022-11-04 ENCOUNTER — Encounter: Payer: Self-pay | Admitting: Physician Assistant

## 2022-11-04 VITALS — BP 122/86 | HR 70 | Temp 98.6°F | Resp 18 | Ht 61.0 in | Wt 115.2 lb

## 2022-11-04 DIAGNOSIS — J329 Chronic sinusitis, unspecified: Secondary | ICD-10-CM

## 2022-11-04 DIAGNOSIS — J45909 Unspecified asthma, uncomplicated: Secondary | ICD-10-CM | POA: Diagnosis not present

## 2022-11-04 DIAGNOSIS — R059 Cough, unspecified: Secondary | ICD-10-CM | POA: Diagnosis not present

## 2022-11-04 MED ORDER — MONTELUKAST SODIUM 10 MG PO TABS: 10.0000 mg | ORAL_TABLET | Freq: Every day | ORAL | 3 refills | Status: DC

## 2022-11-04 NOTE — Telephone Encounter (Signed)
Summary: Advice   Pt is calling to report that she was seen in office on 10/21/22 and then went to ED on 10/25/22. Pt report that a temp returned last night on 99.5. And patient has been in the bed since ED visit. Patient expressed interest in returning to see [place]. Please advise         Reason for Disposition  [1] SEVERE pain AND [2] not improved 2 hours after pain medicine  Answer Assessment - Initial Assessment Questions 1. LOCATION: "Where does it hurt?"      Ear pressure- bilateral, headache between eye, sinus pressure 2. ONSET: "When did the sinus pain start?"  (e.g., hours, days)      Worse within last 2 days 3. SEVERITY: "How bad is the pain?"   (Scale 1-10; mild, moderate or severe)   - MILD (1-3): doesn't interfere with normal activities    - MODERATE (4-7): interferes with normal activities (e.g., work or school) or awakens from sleep   - SEVERE (8-10): excruciating pain and patient unable to do any normal activities        moderate 4. RECURRENT SYMPTOM: "Have you ever had sinus problems before?" If Yes, ask: "When was the last time?" and "What happened that time?"      Sinus hx- has had surgery in the past 5. NASAL CONGESTION: "Is the nose blocked?" If Yes, ask: "Can you open it or must you breathe through your mouth?"     Not blocked- large amount of mucus 6. NASAL DISCHARGE: "Do you have discharge from your nose?" If so ask, "What color?"     green 7. FEVER: "Do you have a fever?" If Yes, ask: "What is it, how was it measured, and when did it start?"      99.5- last night 8. OTHER SYMPTOMS: "Do you have any other symptoms?" (e.g., sore throat, cough, earache, difficulty breathing)     Earache, cough  Protocols used: Sinus Pain or Congestion-A-AH

## 2022-11-04 NOTE — Progress Notes (Signed)
Established patient visit   Patient: Brooke Mcdowell   DOB: 07/18/1951   72 y.o. Female  MRN: [id number] Visit Date: 11/04/2022  Today's healthcare provider: Mardene Speak, PA-C   No chief complaint on file.  Subjective    Sinus Problem This is a new problem. The current episode started 1 to 4 weeks ago (The pt was seen at [facility] ER on 10/25/22, and was diagnose with RSV.). The problem has been gradually worsening since onset. There has been no fever. Her pain is at a severity of 6/10. The pain is moderate. Associated symptoms include chills, congestion, coughing, ear pain, headaches, a hoarse voice, neck pain, shortness of breath, sinus pressure, sneezing and a sore throat. Past treatments include antibiotics, oral decongestants and acetaminophen. The treatment provided mild relief.    Pt reports that her temp returned last night on 99.5. Per chart review, pt was seen for flu-like symptoms due to possible asthma exacerbation on 10/21/22, was treated with Zpack, prednisone and was advised to fu with Dr. Vella Kohler, start albuterol nebulizer. Then, she was seen at ED and was positive for RSV. CXR showed no acute abnormality Advised to continue symptomatic treatment. Pt was d/charged on Tussionex and Zofran. Pt was not able to take Zofran and developed constipation on Tussionex.  Medications: Outpatient Medications Prior to Visit  Medication Sig   azithromycin (ZITHROMAX) 250 MG tablet Take 250 mg by mouth as directed.   chlorpheniramine-HYDROcodone (TUSSIONEX) 10-8 MG/5ML Take 5 mLs by mouth at bedtime as needed for cough.   EQ GENTLE LUBRICANT 0.3 % SOLN Place 1 drop into both eyes at bedtime as needed (dry/irritated eyes.).   estradiol (ESTRACE) 1 MG tablet TAKE 1 TABLET(1 MG) BY MOUTH DAILY   fluticasone (FLONASE) 50 MCG/ACT nasal spray Place 2 sprays into both nostrils daily.   Galcanezumab-gnlm (EMGALITY) 120 MG/ML SOAJ Inject into the skin.   Magnesium 250 MG TABS Take 500 mg by  mouth every evening.    medroxyPROGESTERone (PROVERA) 2.5 MG tablet TAKE 1 TABLET(2.5 MG) BY MOUTH DAILY   meloxicam (MOBIC) 15 MG tablet TAKE 1 TABLET(15 MG) BY MOUTH DAILY   Multiple Vitamins-Minerals (ADULT GUMMY PO) Take 2 tablets by mouth every evening. Vitafusion MultiVites Gummy   naratriptan (AMERGE) 2.5 MG tablet Take by mouth.   NURTEC 75 MG TBDP Take by mouth daily.   omeprazole (PRILOSEC) 40 MG capsule Take 1 capsule (40 mg total) by mouth daily.   ondansetron (ZOFRAN-ODT) 4 MG disintegrating tablet Take 1 tablet (4 mg total) by mouth every 6 (six) hours as needed for nausea or vomiting.   predniSONE (DELTASONE) 20 MG tablet Take 1 tablet (20 mg total) by mouth daily with breakfast.   rosuvastatin (CRESTOR) 10 MG tablet TAKE 1 TABLET(10 MG) BY MOUTH DAILY   sertraline (ZOLOFT) 50 MG tablet TAKE 1 TABLET(50 MG) BY MOUTH AT BEDTIME   zolpidem (AMBIEN) 10 MG tablet TAKE 1 TABLET(10 MG) BY MOUTH AT BEDTIME AS NEEDED FOR SLEEP   No facility-administered medications prior to visit.    Review of Systems  Constitutional:  Positive for chills.  HENT:  Positive for congestion, ear pain, hoarse voice, sinus pressure, sneezing and sore throat.   Respiratory:  Positive for cough and shortness of breath.   Musculoskeletal:  Positive for neck pain.  Neurological:  Positive for headaches.       Objective    There were no vitals taken for this visit.   Physical Exam Vitals reviewed.  Constitutional:  General: She is in acute distress.     Appearance: Normal appearance. She is well-developed. She is not diaphoretic.  HENT:     Head: Normocephalic and atraumatic.     Right Ear: Ear canal and external ear normal.     Left Ear: Ear canal and external ear normal.     Nose: Congestion and rhinorrhea present.     Mouth/Throat:     Pharynx: Posterior oropharyngeal erythema (mild) present.  Eyes:     General: No scleral icterus.       Right eye: No discharge.     Extraocular  Movements: Extraocular movements intact.     Conjunctiva/sclera: Conjunctivae normal.     Pupils: Pupils are equal, round, and reactive to light.  Neck:     Thyroid: No thyromegaly.  Cardiovascular:     Rate and Rhythm: Normal rate and regular rhythm.     Pulses: Normal pulses.     Heart sounds: Normal heart sounds. No murmur heard. Pulmonary:     Effort: Pulmonary effort is normal. No respiratory distress.     Breath sounds: Normal breath sounds. No wheezing, rhonchi or rales.  Abdominal:     General: Abdomen is flat. Bowel sounds are normal.     Palpations: Abdomen is soft.  Musculoskeletal:        General: Normal range of motion.     Cervical back: Normal range of motion and neck supple. No tenderness.     Right lower leg: No edema.     Left lower leg: No edema.  Lymphadenopathy:     Cervical: No cervical adenopathy.  Skin:    General: Skin is warm and dry.     Findings: No rash.  Neurological:     Mental Status: She is alert and oriented to person, place, and time. Mental status is at baseline.  Psychiatric:        Mood and Affect: Mood normal.        Behavior: Behavior normal.        Thought Content: Thought content normal.        Judgment: Judgment normal.      No results found for any visits on 11/04/22.  Assessment & Plan     1. Cough, unspecified type 2. Rhinosinusitis Post viral cough Could be due to allergy, virus, cough variant asthma Was diagnosed by Dr. Vella Kohler on 06/03/19, was treated with albuterol, Singulair, prednisone, omeprazole Advised to continue nasal saline rinse, Flonase 36mg, warm salt gargle, OTC antihistamines, Singulair, 10 mg, omeprazole '40mg'$ , prednisone 20 mg, drink a lot of water - montelukast (SINGULAIR) 10 MG tablet; Take 1 tablet (10 mg total) by mouth at bedtime.  Dispense: 30 tablet; Refill: 3 Continue albuterol sulfate 0.083%/Rx by FRaul Delon 10/25/22 Will reassess PRN  The patient was advised to call back or seek an in-person  evaluation if the symptoms worsen or if the condition fails to improve as anticipated.  I discussed the assessment and treatment plan with the patient. The patient was provided an opportunity to ask questions and all were answered. The patient agreed with the plan and demonstrated an understanding of the instructions.  The entirety of the information documented in the History of Present Illness, Review of Systems and Physical Exam were personally obtained by me. Portions of this information were initially documented by the CMA and reviewed by me for thoroughness and accuracy.  JMardene Speak [phone](phone) [phone](fax)

## 2022-11-04 NOTE — Telephone Encounter (Signed)
  Chief Complaint: sinus pain Symptoms: recent URI, RSV- patient thinks she has developed secondary sinus infection- nasal congestion, ear pressure, pain in face, green mucus discharge, low grade fever Frequency: 2 days Pertinent Negatives: Patient denies   Disposition: '[]'$ ED /'[]'$ Urgent Care (no appt availability in office) / '[x]'$ Appointment(In office/virtual)/ '[]'$  [place] [facility]/ '[]'$ Home Care/ '[]'$ Refused Recommended Disposition /'[]'$ [place] [facility]/ '[]'$  Follow-up with PCP Additional Notes:

## 2022-11-05 ENCOUNTER — Other Ambulatory Visit: Payer: Self-pay | Admitting: Family Medicine

## 2022-11-05 DIAGNOSIS — G4700 Insomnia, unspecified: Secondary | ICD-10-CM

## 2022-11-09 NOTE — Telephone Encounter (Signed)
Pt called for status update, says she has a couple left and that she will need this in the next day or two she says.

## 2022-11-11 ENCOUNTER — Ambulatory Visit: Payer: Self-pay

## 2022-11-11 DIAGNOSIS — J329 Chronic sinusitis, unspecified: Secondary | ICD-10-CM

## 2022-11-11 MED ORDER — AMOXICILLIN-POT CLAVULANATE 875-125 MG PO TABS: 1.0000 | ORAL_TABLET | Freq: Two times a day (BID) | ORAL | 0 refills | Status: DC

## 2022-11-11 NOTE — Telephone Encounter (Signed)
Patient is calling again and upset that her medication hasn't been sent to the pharmacy. Please review.

## 2022-11-11 NOTE — Addendum Note (Signed)
Addended by: Mardene Speak on: 11/11/2022 04:47 PM   Modules accepted: Orders

## 2022-11-11 NOTE — Telephone Encounter (Addendum)
PT states that amoxicillin works best for her sinus infections.   Chief Complaint: Wants antibiotic for sinus infection. Symptoms: HA, earache, Scratchy throat, behind eyes and nose.  Frequency: Ongoing  - seen recently for same Pertinent Negatives: Patient denies  Disposition: []$ ED /[]$ Urgent Care (no appt availability in office) / []$ Appointment(In office/virtual)/ []$  [place] [facility]/ []$ Home Care/ []$ Refused Recommended Disposition /[]$ [place] [facility]/ [x]$  Follow-up with PCP Additional Notes: PT states that she has a sinus infection. HA, earache, Scratchy throat, behind eyes and nose.   Please advise.     Summary: Congestion/headache/fever   Patient states that she thinks she has a sinus infection. Patient is experiencing headaches, congestion, fever. She states that she has seen her pcp two times in the last three weeks and would like her to send in a antibiotic.     Reason for Disposition  Prescription request for new medicine (not a refill)  Answer Assessment - Initial Assessment Questions 1. DRUG NAME: "What medicine do you need to have refilled?"     Pt would like an antibiotic 2. REFILLS REMAINING: "How many refills are remaining?" (Note: The label on the medicine or pill bottle will show how many refills are remaining. If there are no refills remaining, then a renewal may be needed.)      3. EXPIRATION DATE: "What is the expiration date?" (Note: The label states when the prescription will expire, and thus can no longer be refilled.)      4. PRESCRIBING HCP: "Who prescribed it?" Reason: If prescribed by specialist, call should be referred to that group.      5. SYMPTOMS: "Do you have any symptoms?"     HA, earache, Scratchy throat, behind eyes and nose.  6. PREGNANCY: "Is there any chance that you are pregnant?" "When was your last menstrual period?"  Protocols used: Medication Refill and Renewal Call-A-AH

## 2022-11-14 ENCOUNTER — Other Ambulatory Visit: Payer: Self-pay | Admitting: Physician Assistant

## 2022-11-23 DIAGNOSIS — M5116 Intervertebral disc disorders with radiculopathy, lumbar region: Secondary | ICD-10-CM | POA: Diagnosis not present

## 2022-11-23 DIAGNOSIS — M7918 Myalgia, other site: Secondary | ICD-10-CM | POA: Diagnosis not present

## 2022-11-23 DIAGNOSIS — M5136 Other intervertebral disc degeneration, lumbar region: Secondary | ICD-10-CM | POA: Diagnosis not present

## 2022-11-23 DIAGNOSIS — M5416 Radiculopathy, lumbar region: Secondary | ICD-10-CM | POA: Diagnosis not present

## 2022-11-23 DIAGNOSIS — M4316 Spondylolisthesis, lumbar region: Secondary | ICD-10-CM | POA: Diagnosis not present

## 2022-11-23 DIAGNOSIS — M2578 Osteophyte, vertebrae: Secondary | ICD-10-CM | POA: Diagnosis not present

## 2022-11-23 DIAGNOSIS — M4726 Other spondylosis with radiculopathy, lumbar region: Secondary | ICD-10-CM | POA: Diagnosis not present

## 2022-12-02 DIAGNOSIS — M5416 Radiculopathy, lumbar region: Secondary | ICD-10-CM | POA: Diagnosis not present

## 2022-12-02 DIAGNOSIS — M48062 Spinal stenosis, lumbar region with neurogenic claudication: Secondary | ICD-10-CM | POA: Diagnosis not present

## 2022-12-03 DIAGNOSIS — J45909 Unspecified asthma, uncomplicated: Secondary | ICD-10-CM | POA: Diagnosis not present

## 2022-12-09 ENCOUNTER — Other Ambulatory Visit: Payer: Self-pay | Admitting: Physician Assistant

## 2022-12-09 DIAGNOSIS — G4700 Insomnia, unspecified: Secondary | ICD-10-CM

## 2022-12-09 NOTE — Telephone Encounter (Signed)
Requested medication (s) are due for refill today: yes  Requested medication (s) are on the active medication list: yes  Last refill:  11/14/22  Future visit scheduled: yes  Notes to clinic:  Unable to refill per protocol, cannot delegate.      Requested Prescriptions  Pending Prescriptions Disp Refills   zolpidem (AMBIEN) 10 MG tablet [Pharmacy Med Name: ZOLPIDEM '10MG'$  TABLETS] 30 tablet     Sig: TAKE 1 TABLET(10 MG) BY MOUTH AT BEDTIME AS NEEDED FOR SLEEP     Not Delegated - Psychiatry:  Anxiolytics/Hypnotics Failed - 12/09/2022  7:50 AM      Failed - This refill cannot be delegated      Failed - Urine Drug Screen completed in last 360 days      Passed - Valid encounter within last 6 months    Recent Outpatient Visits           1 month ago Cough, unspecified type   [place] Maceo, [place], PA-C   1 month ago Flu-like symptoms   [place], [place], PA-C   4 months ago Mixed hyperlipidemia   [facility] Tally Joe T, FNP   12 months ago Bacterial sinusitis   [place] Tally Joe T, [place]   1 year ago Screening for colon cancer   [place] Gwyneth Sprout, [place]       Future Appointments             In 7 months [place], Letitia Libra, PA-C [facility], [facility]

## 2022-12-09 NOTE — Telephone Encounter (Signed)
Please, let pt know that she might need to schedule an appt for her anxiety/insomnia within the next two months.

## 2022-12-19 ENCOUNTER — Ambulatory Visit (INDEPENDENT_AMBULATORY_CARE_PROVIDER_SITE_OTHER): Payer: PPO

## 2022-12-19 VITALS — Ht 61.5 in | Wt 115.0 lb

## 2022-12-19 DIAGNOSIS — Z0000 Encounter for general adult medical examination without abnormal findings: Secondary | ICD-10-CM | POA: Diagnosis not present

## 2022-12-19 NOTE — Progress Notes (Signed)
I connected with  Brooke Mcdowell on 12/19/22 by a audio enabled telemedicine application and verified that I am speaking with the correct person using two identifiers.  Patient Location: Home  Provider Location: Office/Clinic  I discussed the limitations of evaluation and management by telemedicine. The patient expressed understanding and agreed to proceed.  Subjective:   Brooke Mcdowell is a 72 y.o. female who presents for Medicare Annual (Subsequent) preventive examination.  Review of Systems    Cardiac Risk Factors include: advanced age (>16men, >65 women);dyslipidemia     Objective:    Today's Vitals   12/19/22 1341  Weight: 115 lb (52.2 kg)  Height: 5' 1.5" (1.562 m)   Body mass index is 21.38 kg/m.     12/19/2022    1:59 PM 12/14/2021    2:30 PM 07/22/2021    7:00 AM 09/15/2020    8:14 AM 06/22/2020    2:08 PM 06/17/2019    2:19 PM 06/15/2018    9:31 AM  Advanced Directives  Does Patient Have a Medical Advance Directive? Yes No Yes Yes Yes Yes Yes  Type of Theatre stage manager of [place];Living will [place];Living will [place];Living will [place];Living will  Does patient want to make changes to medical advance directive?    Yes (MAU/Ambulatory/Procedural Areas - Information given)     Copy of [place] in Chart?    No - copy requested No - copy requested No - copy requested No - copy requested  Would patient like information on creating a medical advance directive?  No - Patient declined         Current Medications (verified) Outpatient Encounter Medications as of 12/19/2022  Medication Sig   EQ GENTLE LUBRICANT 0.3 % SOLN Place 1 drop into both eyes at bedtime as needed (dry/irritated eyes.).   estradiol (ESTRACE) 1 MG tablet TAKE 1 TABLET(1 MG) BY MOUTH DAILY   fluticasone (FLONASE) 50 MCG/ACT nasal spray Place 2 sprays into both nostrils daily.   Galcanezumab-gnlm  (EMGALITY) 120 MG/ML SOAJ Inject into the skin.   Magnesium 250 MG TABS Take 500 mg by mouth every evening.    medroxyPROGESTERone (PROVERA) 2.5 MG tablet TAKE 1 TABLET(2.5 MG) BY MOUTH DAILY   meloxicam (MOBIC) 15 MG tablet TAKE 1 TABLET(15 MG) BY MOUTH DAILY (Patient taking differently: Take 15 mg by mouth as needed for pain.)   Multiple Vitamins-Minerals (ADULT GUMMY PO) Take 2 tablets by mouth every evening. Vitafusion MultiVites Gummy   naratriptan (AMERGE) 2.5 MG tablet Take by mouth.   omeprazole (PRILOSEC) 40 MG capsule Take 1 capsule (40 mg total) by mouth daily.   rosuvastatin (CRESTOR) 10 MG tablet TAKE 1 TABLET(10 MG) BY MOUTH DAILY   sertraline (ZOLOFT) 50 MG tablet TAKE 1 TABLET(50 MG) BY MOUTH AT BEDTIME   zolpidem (AMBIEN) 10 MG tablet TAKE 1 TABLET(10 MG) BY MOUTH AT BEDTIME AS NEEDED FOR SLEEP   amoxicillin-clavulanate (AUGMENTIN) 875-125 MG tablet Take 1 tablet by mouth 2 (two) times daily. (Patient not taking: Reported on 12/19/2022)   chlorpheniramine-HYDROcodone (TUSSIONEX) 10-8 MG/5ML Take 5 mLs by mouth at bedtime as needed for cough. (Patient not taking: Reported on 12/19/2022)   montelukast (SINGULAIR) 10 MG tablet Take 1 tablet (10 mg total) by mouth at bedtime. (Patient not taking: Reported on 12/19/2022)   NURTEC 75 MG TBDP Take by mouth daily. (Patient not taking: Reported on 11/04/2022)   ondansetron (ZOFRAN-ODT) 4 MG  disintegrating tablet Take 1 tablet (4 mg total) by mouth every 6 (six) hours as needed for nausea or vomiting. (Patient not taking: Reported on 11/04/2022)   No facility-administered encounter medications on file as of 12/19/2022.    Allergies (verified) Gabapentin, Latex, Tetracyclines & related, and Sulfa antibiotics   History: Past Medical History:  Diagnosis Date   Ankle pain, left    previous strain   Arthritis    hands and back   Arthropathy of hand    Dysthymic disorder    Esophageal reflux    History of kidney stones    Insomnia     Lumbar herniated disc    Gets injections in back 3 times a year   Migraine    almost evrery day   Osteoporosis    Panic disorder    Pure hypercholesterolemia    Vertigo    with sinus infection   Vitamin D deficiency    Past Surgical History:  Procedure Laterality Date   CATARACT EXTRACTION W/PHACO Right 12/06/2018   Procedure: CATARACT EXTRACTION PHACO AND INTRAOCULAR LENS PLACEMENT (IOC)-RIGHT;  Surgeon: Marchia Meiers, MD;  Location: [facility] ORS;  Service: Ophthalmology;  Laterality: Right;  [place] 01:28.9 CDE 11.98 Fluid Pack Lot # [id number]   CATARACT EXTRACTION W/PHACO Left 09/15/2020   Procedure: CATARACT EXTRACTION PHACO AND INTRAOCULAR LENS PLACEMENT (IOC) LEFT 8.63 00:52.2;  Surgeon: Birder Robson, MD;  Location: [place];  Service: Ophthalmology;  Laterality: Left;   clot removal from right sinus after surgery to remove tumor in [place] N/A 07/22/2021   Procedure: COLONOSCOPY WITH PROPOFOL;  Surgeon: Jonathon Bellows, MD;  Location: [facility] ENDOSCOPY;  Service: Gastroenterology;  Laterality: N/A;   LITHOTRIPSY  [phone]   renal stones   TUBAL LIGATION     TUMOR REMOVAL     behind right ete in sinuses- 1995 Dr. Pryor Ochoa   Family History  Problem Relation Age of Onset   Brain cancer Mother    Cancer Mother        lung   Cancer Father        lung    Brain cancer Father    Social History   Socioeconomic History   Marital status: Divorced    Spouse name: Not on file   Number of children: 2   Years of education: Not on file   Highest education level: Some college, no degree  Occupational History   Occupation: retired  Tobacco Use   Smoking status: Never   Smokeless tobacco: Never  Vaping Use   Vaping Use: Never used  Substance and Sexual Activity   Alcohol use: Yes    Comment: rarely - 1 drinks    Drug use: No   Sexual activity: Not on file  Other Topics Concern   Not on file  Social History Narrative   Not on file   Social  Determinants of Health   Financial Resource Strain: Low Risk  (12/19/2022)   Overall Financial Resource Strain (CARDIA)    Difficulty of Paying Living Expenses: Not hard at all  Food Insecurity: No Food Insecurity (12/19/2022)   Hunger Vital Sign    Worried About Running Out of Food in the Last Year: Never true    [place] in the Last Year: Never true  Transportation Needs: No Transportation Needs (12/19/2022)   PRAPARE - Hydrologist (Medical): No    Lack of Transportation (Non-Medical): No  Physical Activity: Insufficiently  Active (12/19/2022)   Exercise Vital Sign    Days of Exercise per Week: 3 days    Minutes of Exercise per Session: 30 min  Stress: No Stress Concern Present (12/19/2022)   [place]    Feeling of Stress : Not at all  Social Connections: Moderately Integrated (12/19/2022)   Social Connection and Isolation Panel [NHANES]    Frequency of Communication with Friends and Family: More than three times a week    Frequency of Social Gatherings with Friends and Family: Twice a week    Attends Religious Services: More than 4 times per year    Active Member of [employer] or Organizations: No    Attends Music therapist: Never    Marital Status: Living with partner    Tobacco Counseling Counseling given: Not Answered   Clinical Intake:  Pre-visit preparation completed: Yes  Pain : No/denies pain     BMI - recorded: 21.38 Nutritional Risks: None Diabetes: No  How often do you need to have someone help you when you read instructions, pamphlets, or other written materials from your doctor or pharmacy?: 1 - Never  Diabetic?no  Interpreter Needed?: No  Comments: lives w/fiancee Information entered by :: B.Dajae Kizer,LPN   Activities of Daily Living    12/19/2022    2:03 PM 10/21/2022    3:38 PM  In your present state of health, do you have any difficulty  performing the following activities:  Hearing? 0 0  Vision? 0 0  Difficulty concentrating or making decisions? 0 0  Walking or climbing stairs? 0 0  Dressing or bathing? 0 0  Doing errands, shopping? 0 0  Preparing Food and eating ? N   Using the Toilet? N   In the past six months, have you accidently leaked urine? N   Do you have problems with loss of bowel control? N   Managing your Medications? N   Managing your Finances? N   Housekeeping or managing your Housekeeping? N     Patient Care Team: Mardene Speak, PA-C as PCP - General (Physician Assistant) Sharlet Salina, MD as Referring Physician (Physical Medicine and Rehabilitation) Marchia Meiers, MD (Inactive) as Consulting Physician (Ophthalmology) Erby Pian, MD as Referring Physician (Specialist) Vladimir Crofts, MD as Consulting Physician (Neurology) Dasher, Rayvon Char, MD (Dermatology)  Indicate any recent Medical Services you may have received from other than [facility] providers in the past year (date may be approximate).     Assessment:   This is a routine wellness examination for [place].  Hearing/Vision screen Hearing Screening - Comments:: Adequate Hearing Vision Screening - Comments:: Adequate w/glasses for reading  [place] issues and exercise activities discussed: Current Exercise Habits: Home exercise routine;Structured exercise class, Time (Minutes): 30, Frequency (Times/Week): 5, Weekly Exercise (Minutes/Week): 150, Intensity: Mild, Exercise limited by: None identified   Goals Addressed             This Visit's Progress    DIET - EAT MORE FRUITS AND VEGETABLES   On track    Increase water intake   On track    Recommend increasing water intake to 4-6 glasses a day.        Depression Screen    12/19/2022    1:55 PM 10/21/2022    3:38 PM 07/20/2022    2:00 PM 12/14/2021    2:28 PM 07/15/2021    1:58 PM 07/14/2020    2:11 PM 06/22/2020  2:05 PM  PHQ 2/9 Scores  PHQ - 2 Score 0 0  0 0 0 0 0  PHQ- 9 Score 1 2 2  4 1      Fall Risk    12/19/2022    1:45 PM 10/21/2022    3:38 PM 07/20/2022    2:00 PM 12/14/2021    2:30 PM 12/14/2021    2:09 PM  Fall Risk   Falls in the past year? 0 0 0 0 0  Number falls in past yr: 0 0 0 0   Injury with Fall? 0 0 0 0   Risk for fall due to : No Fall Risks No Fall Risks No Fall Risks No Fall Risks   Follow up Education provided;Falls prevention discussed Falls evaluation completed Falls evaluation completed Falls evaluation completed     FALL RISK PREVENTION PERTAINING TO THE HOME:  Any stairs in or around the home? Yes outside If so, are there any without handrails? Yes  Home free of loose throw rugs in walkways, pet beds, electrical cords, etc? Yes  Adequate lighting in your home to reduce risk of falls? Yes   ASSISTIVE DEVICES UTILIZED TO PREVENT FALLS:  Life alert? No  Use of a cane, walker or w/c? No  Grab bars in the bathroom? Yes  Shower chair or bench in shower? Yes  Elevated toilet seat or a handicapped toilet? Yes   Cognitive Function:        12/19/2022    2:04 PM 07/14/2020    2:12 PM 09/17/2019    1:58 PM 06/13/2017    2:25 PM  6CIT Screen  What Year? 0 points 0 points 0 points 0 points  What month? 0 points 0 points 0 points 0 points  What time? 0 points 0 points 0 points 0 points  Count back from 20 0 points 0 points 0 points 0 points  Months in reverse 0 points 0 points 2 points 0 points  Repeat phrase 0 points 0 points 4 points 4 points  Total Score 0 points 0 points 6 points 4 points    Immunizations Immunization History  Administered Date(s) Administered   Fluad Quad(high Dose 65+) 07/06/2020, 07/15/2021   Influenza, High Dose Seasonal PF 07/20/2017, 06/07/2019   Influenza-Unspecified 06/29/2018, 07/22/2022   Moderna Sars-Covid-2 Vaccination 11/14/2019, 12/12/2019   Pneumococcal Conjugate-13 06/13/2017   Pneumococcal Polysaccharide-23 07/03/2018   Tdap 03/17/2017   Zoster, Live 04/26/2013     TDAP status: Up to date  Flu Vaccine status: Up to date  Pneumococcal vaccine status: Up to date  Covid-19 vaccine status: Completed vaccines  Qualifies for Shingles Vaccine? Yes   Zostavax completed No   Shingrix Completed?: No.    Education has been provided regarding the importance of this vaccine. Patient has been advised to call insurance company to determine out of pocket expense if they have not yet received this vaccine. Advised may also receive vaccine at local pharmacy or Health Dept. Verbalized acceptance and understanding.  Screening Tests Health Maintenance  Topic Date Due   Zoster Vaccines- Shingrix (1 of 2) Never done   COVID-19 Vaccine (3 - 2023-24 season) 06/03/2022   MAMMOGRAM  07/29/2023   Medicare Annual Wellness (AWV)  12/19/2023   DEXA SCAN  02/11/2027   DTaP/Tdap/Td (2 - Td or Tdap) 03/18/2027   COLONOSCOPY (Pts 45-36yrs Insurance coverage will need to be confirmed)  07/23/2031   Pneumonia Vaccine 75+ Years old  Completed   INFLUENZA VACCINE  Completed   Hepatitis C  Screening  Completed   HPV VACCINES  Aged Out    Health Maintenance  Health Maintenance Due  Topic Date Due   Zoster Vaccines- Shingrix (1 of 2) Never done   COVID-19 Vaccine (3 - 2023-24 season) 06/03/2022    Colorectal cancer screening: Type of screening: Colonoscopy. Completed yes. Repeat every 10 years  Mammogram status: Completed yes. Repeat every year  Bone Density status: Completed yes. Results reflect: Bone density results: NORMAL. Repeat every 5 years.  Lung Cancer Screening: (Low Dose CT Chest recommended if Age 10-80 years, 30 pack-year currently smoking OR have quit w/in 15years.) does not qualify.   Lung Cancer Screening Referral: no  Additional Screening:  Hepatitis C Screening: does not qualify; Completed yes  Vision Screening: Recommended annual ophthalmology exams for early detection of glaucoma and other disorders of the eye. Is the patient up to date with  their annual eye exam?  Yes  Who is the provider or what is the name of the office in which the patient attends annual eye exams? [place] If pt is not established with a provider, would they like to be referred to a provider to establish care? No .   Dental Screening: Recommended annual dental exams for proper oral hygiene  Community Resource Referral / Chronic Care Management: CRR required this visit?  No   CCM required this visit?  No      Plan:     I have personally reviewed and noted the following in the patient's chart:   Medical and social history Use of alcohol, tobacco or illicit drugs  Current medications and supplements including opioid prescriptions. Patient is not currently taking opioid prescriptions. Functional ability and status Nutritional status Physical activity Advanced directives List of other physicians Hospitalizations, surgeries, and ER visits in previous 12 months Vitals Screenings to include cognitive, depression, and falls Referrals and appointments  In addition, I have reviewed and discussed with patient certain preventive protocols, quality metrics, and best practice recommendations. A written personalized care plan for preventive services as well as general preventive health recommendations were provided to patient.     Roger Shelter, LPN   [id number]   Nurse Notes: pt is doing well:she has no concerns or questions during the visit.

## 2022-12-23 DIAGNOSIS — M5416 Radiculopathy, lumbar region: Secondary | ICD-10-CM | POA: Diagnosis not present

## 2022-12-23 DIAGNOSIS — M48062 Spinal stenosis, lumbar region with neurogenic claudication: Secondary | ICD-10-CM | POA: Diagnosis not present

## 2022-12-23 DIAGNOSIS — M5380 Other specified dorsopathies, site unspecified: Secondary | ICD-10-CM | POA: Diagnosis not present

## 2022-12-23 DIAGNOSIS — M5136 Other intervertebral disc degeneration, lumbar region: Secondary | ICD-10-CM | POA: Diagnosis not present

## 2023-01-03 DIAGNOSIS — J45909 Unspecified asthma, uncomplicated: Secondary | ICD-10-CM | POA: Diagnosis not present

## 2023-01-10 ENCOUNTER — Other Ambulatory Visit: Payer: Self-pay | Admitting: Family Medicine

## 2023-01-10 DIAGNOSIS — G4700 Insomnia, unspecified: Secondary | ICD-10-CM

## 2023-01-10 DIAGNOSIS — F419 Anxiety disorder, unspecified: Secondary | ICD-10-CM

## 2023-01-13 ENCOUNTER — Other Ambulatory Visit: Payer: Self-pay | Admitting: Family Medicine

## 2023-01-13 DIAGNOSIS — M19041 Primary osteoarthritis, right hand: Secondary | ICD-10-CM

## 2023-01-16 NOTE — Telephone Encounter (Signed)
Requested Prescriptions  Pending Prescriptions Disp Refills   meloxicam (MOBIC) 15 MG tablet [Pharmacy Med Name: MELOXICAM 15MG  TABLETS] 90 tablet 0    Sig: TAKE 1 TABLET(15 MG) BY MOUTH DAILY     Analgesics:  COX2 Inhibitors Failed - 01/13/2023  1:18 PM      Failed - Manual Review: Labs are only required if the patient has taken medication for more than 8 weeks.      Passed - HGB in normal range and within 360 days    Hemoglobin  Date Value Ref Range Status  07/20/2022 14.1 11.1 - 15.9 g/dL Final         Passed - Cr in normal range and within 360 days    Creatinine, Ser  Date Value Ref Range Status  07/20/2022 0.85 0.57 - 1.00 mg/dL Final         Passed - HCT in normal range and within 360 days    Hematocrit  Date Value Ref Range Status  07/20/2022 41.6 34.0 - 46.6 % Final         Passed - AST in normal range and within 360 days    AST  Date Value Ref Range Status  07/20/2022 20 0 - 40 IU/L Final         Passed - ALT in normal range and within 360 days    ALT  Date Value Ref Range Status  07/20/2022 18 0 - 32 IU/L Final         Passed - eGFR is 30 or above and within 360 days    GFR calc Af Amer  Date Value Ref Range Status  07/16/2020 92 >59 mL/min/1.73 Final    Comment:    **In accordance with recommendations from the NKF-ASN Task force,**   [facility] is in the process of updating its eGFR calculation to the   2021 CKD-EPI creatinine equation that estimates kidney function   without a race variable.    GFR calc non Af Amer  Date Value Ref Range Status  07/16/2020 80 >59 mL/min/1.73 Final   eGFR  Date Value Ref Range Status  07/20/2022 73 >59 mL/min/1.73 Final         Passed - Patient is not pregnant      Passed - Valid encounter within last 12 months    Recent Outpatient Visits           2 months ago Cough, unspecified type   [place] [facility] [place], [place], PA-C   2 months ago Flu-like symptoms   [place] [facility] [place], [place], PA-C   6 months ago Mixed hyperlipidemia   [facility] Merita Norton T, FNP   1 year ago Bacterial sinusitis   [place] [facility] Jacky Kindle, FNP   1 year ago Screening for colon cancer   [place] [facility] Jacky Kindle, FNP       Future Appointments             In 6 months [place], Edmon Crape, PA-C [facility], [facility]

## 2023-02-02 DIAGNOSIS — J45909 Unspecified asthma, uncomplicated: Secondary | ICD-10-CM | POA: Diagnosis not present

## 2023-02-10 DIAGNOSIS — G43719 Chronic migraine without aura, intractable, without status migrainosus: Secondary | ICD-10-CM | POA: Diagnosis not present

## 2023-02-10 DIAGNOSIS — M542 Cervicalgia: Secondary | ICD-10-CM | POA: Diagnosis not present

## 2023-02-10 DIAGNOSIS — M5030 Other cervical disc degeneration, unspecified cervical region: Secondary | ICD-10-CM | POA: Diagnosis not present

## 2023-02-10 DIAGNOSIS — K121 Other forms of stomatitis: Secondary | ICD-10-CM | POA: Diagnosis not present

## 2023-02-10 DIAGNOSIS — M5481 Occipital neuralgia: Secondary | ICD-10-CM | POA: Diagnosis not present

## 2023-02-10 DIAGNOSIS — R42 Dizziness and giddiness: Secondary | ICD-10-CM | POA: Diagnosis not present

## 2023-02-17 DIAGNOSIS — L570 Actinic keratosis: Secondary | ICD-10-CM | POA: Diagnosis not present

## 2023-02-17 DIAGNOSIS — D225 Melanocytic nevi of trunk: Secondary | ICD-10-CM | POA: Diagnosis not present

## 2023-02-17 DIAGNOSIS — D2261 Melanocytic nevi of right upper limb, including shoulder: Secondary | ICD-10-CM | POA: Diagnosis not present

## 2023-02-17 DIAGNOSIS — Z08 Encounter for follow-up examination after completed treatment for malignant neoplasm: Secondary | ICD-10-CM | POA: Diagnosis not present

## 2023-02-17 DIAGNOSIS — D2271 Melanocytic nevi of right lower limb, including hip: Secondary | ICD-10-CM | POA: Diagnosis not present

## 2023-02-17 DIAGNOSIS — Z85828 Personal history of other malignant neoplasm of skin: Secondary | ICD-10-CM | POA: Diagnosis not present

## 2023-02-17 DIAGNOSIS — L538 Other specified erythematous conditions: Secondary | ICD-10-CM | POA: Diagnosis not present

## 2023-02-17 DIAGNOSIS — L298 Other pruritus: Secondary | ICD-10-CM | POA: Diagnosis not present

## 2023-02-17 DIAGNOSIS — L820 Inflamed seborrheic keratosis: Secondary | ICD-10-CM | POA: Diagnosis not present

## 2023-02-17 DIAGNOSIS — D2262 Melanocytic nevi of left upper limb, including shoulder: Secondary | ICD-10-CM | POA: Diagnosis not present

## 2023-02-24 DIAGNOSIS — M5481 Occipital neuralgia: Secondary | ICD-10-CM | POA: Diagnosis not present

## 2023-02-24 DIAGNOSIS — R519 Headache, unspecified: Secondary | ICD-10-CM | POA: Diagnosis not present

## 2023-02-24 DIAGNOSIS — M7918 Myalgia, other site: Secondary | ICD-10-CM | POA: Diagnosis not present

## 2023-03-09 ENCOUNTER — Other Ambulatory Visit: Payer: Self-pay | Admitting: Physician Assistant

## 2023-03-09 DIAGNOSIS — G4700 Insomnia, unspecified: Secondary | ICD-10-CM

## 2023-03-09 NOTE — Telephone Encounter (Signed)
Requested medication (s) are due for refill today: yes  Requested medication (s) are on the active medication list: yes  Last refill:  01/12/23  Future visit scheduled: yes  Notes to clinic:  Unable to refill per protocol, cannot delegate.      Requested Prescriptions  Pending Prescriptions Disp Refills   zolpidem (AMBIEN) 10 MG tablet [Pharmacy Med Name: ZOLPIDEM 10MG  TABLETS] 30 tablet     Sig: TAKE 1 TABLET(10 MG) BY MOUTH AT BEDTIME AS NEEDED FOR SLEEP     Not Delegated - Psychiatry:  Anxiolytics/Hypnotics Failed - 03/09/2023 10:15 AM      Failed - This refill cannot be delegated      Failed - Urine Drug Screen completed in last 360 days      Passed - Valid encounter within last 6 months    Recent Outpatient Visits           4 months ago Cough, unspecified type   [place] [facility] [place], [place], PA-C   4 months ago Flu-like symptoms   [place] [facility] [place], [place], PA-C   7 months ago Mixed hyperlipidemia   [facility] Merita Norton T, FNP   1 year ago Bacterial sinusitis   [place] [facility] Jacky Kindle, FNP   1 year ago Screening for colon cancer   [place] [facility] Jacky Kindle, FNP       Future Appointments             In 4 months Ostwalt, Edmon Crape, PA-C [facility], [facility]

## 2023-03-16 DIAGNOSIS — M542 Cervicalgia: Secondary | ICD-10-CM | POA: Diagnosis not present

## 2023-03-30 DIAGNOSIS — M542 Cervicalgia: Secondary | ICD-10-CM | POA: Diagnosis not present

## 2023-04-05 DIAGNOSIS — M542 Cervicalgia: Secondary | ICD-10-CM | POA: Diagnosis not present

## 2023-04-11 ENCOUNTER — Other Ambulatory Visit: Payer: Self-pay | Admitting: Physician Assistant

## 2023-04-11 DIAGNOSIS — G4700 Insomnia, unspecified: Secondary | ICD-10-CM

## 2023-04-12 NOTE — Telephone Encounter (Signed)
Requested medication (s) are due for refill today: Yes  Requested medication (s) are on the active medication list: Yes  Last refill:  03/13/23 #30 0RF  Future visit scheduled: Yes 07/25/23  Notes to clinic:  Unable to refill per protocol, cannot delegate.      Requested Prescriptions  Pending Prescriptions Disp Refills   zolpidem (AMBIEN) 10 MG tablet [Pharmacy Med Name: ZOLPIDEM 10MG  TABLETS] 30 tablet     Sig: TAKE 1 TABLET(10 MG) BY MOUTH AT BEDTIME AS NEEDED FOR SLEEP     Not Delegated - Psychiatry:  Anxiolytics/Hypnotics Failed - 04/11/2023 10:55 PM      Failed - This refill cannot be delegated      Failed - Urine Drug Screen completed in last 360 days      Passed - Valid encounter within last 6 months    Recent Outpatient Visits           5 months ago Cough, unspecified type   [place] [facility] [place], [place], PA-C   5 months ago Flu-like symptoms   [place] [facility] [place], [place], PA-C   8 months ago Mixed hyperlipidemia   [facility] Merita Norton T, FNP   1 year ago Bacterial sinusitis   [place] [facility] Jacky Kindle, FNP   1 year ago Screening for colon cancer   [place] [facility] Jacky Kindle, FNP       Future Appointments             In 3 months Ostwalt, Edmon Crape, PA-C [facility], [facility]

## 2023-04-13 DIAGNOSIS — M542 Cervicalgia: Secondary | ICD-10-CM | POA: Diagnosis not present

## 2023-04-27 DIAGNOSIS — M542 Cervicalgia: Secondary | ICD-10-CM | POA: Diagnosis not present

## 2023-05-15 ENCOUNTER — Other Ambulatory Visit: Payer: Self-pay | Admitting: Physician Assistant

## 2023-05-15 DIAGNOSIS — G4700 Insomnia, unspecified: Secondary | ICD-10-CM

## 2023-05-25 DIAGNOSIS — M7918 Myalgia, other site: Secondary | ICD-10-CM | POA: Diagnosis not present

## 2023-05-25 DIAGNOSIS — M5481 Occipital neuralgia: Secondary | ICD-10-CM | POA: Diagnosis not present

## 2023-05-30 DIAGNOSIS — R350 Frequency of micturition: Secondary | ICD-10-CM | POA: Diagnosis not present

## 2023-05-30 DIAGNOSIS — J22 Unspecified acute lower respiratory infection: Secondary | ICD-10-CM | POA: Diagnosis not present

## 2023-05-30 DIAGNOSIS — R059 Cough, unspecified: Secondary | ICD-10-CM | POA: Diagnosis not present

## 2023-05-30 DIAGNOSIS — J019 Acute sinusitis, unspecified: Secondary | ICD-10-CM | POA: Diagnosis not present

## 2023-05-30 DIAGNOSIS — R509 Fever, unspecified: Secondary | ICD-10-CM | POA: Diagnosis not present

## 2023-05-30 DIAGNOSIS — R0602 Shortness of breath: Secondary | ICD-10-CM | POA: Diagnosis not present

## 2023-05-30 DIAGNOSIS — J209 Acute bronchitis, unspecified: Secondary | ICD-10-CM | POA: Diagnosis not present

## 2023-05-30 DIAGNOSIS — Z03818 Encounter for observation for suspected exposure to other biological agents ruled out: Secondary | ICD-10-CM | POA: Diagnosis not present

## 2023-06-12 ENCOUNTER — Ambulatory Visit (INDEPENDENT_AMBULATORY_CARE_PROVIDER_SITE_OTHER): Payer: PPO | Admitting: Family Medicine

## 2023-06-12 ENCOUNTER — Encounter: Payer: Self-pay | Admitting: Family Medicine

## 2023-06-12 VITALS — BP 117/59 | HR 75 | Temp 99.2°F | Ht 61.5 in | Wt 112.4 lb

## 2023-06-12 DIAGNOSIS — J029 Acute pharyngitis, unspecified: Secondary | ICD-10-CM

## 2023-06-12 DIAGNOSIS — J039 Acute tonsillitis, unspecified: Secondary | ICD-10-CM

## 2023-06-12 DIAGNOSIS — G4700 Insomnia, unspecified: Secondary | ICD-10-CM

## 2023-06-12 DIAGNOSIS — J45909 Unspecified asthma, uncomplicated: Secondary | ICD-10-CM | POA: Insufficient documentation

## 2023-06-12 DIAGNOSIS — J4520 Mild intermittent asthma, uncomplicated: Secondary | ICD-10-CM | POA: Diagnosis not present

## 2023-06-12 MED ORDER — PREDNISONE 10 MG (21) PO TBPK: ORAL_TABLET | ORAL | 0 refills | Status: DC

## 2023-06-12 MED ORDER — ZOLPIDEM TARTRATE 10 MG PO TABS: 5.0000 mg | ORAL_TABLET | Freq: Every evening | ORAL | 0 refills | Status: DC | PRN

## 2023-06-12 MED ORDER — AMOXICILLIN-POT CLAVULANATE 875-125 MG PO TABS: 1.0000 | ORAL_TABLET | Freq: Two times a day (BID) | ORAL | 0 refills | Status: DC

## 2023-06-12 NOTE — Progress Notes (Signed)
Established patient visit   Patient: Brooke Mcdowell   DOB: 1951/06/29   72 y.o. Female  MRN: [id number] Visit Date: 06/12/2023  Today's healthcare provider: Mila Merry, MD   Chief Complaint  Patient presents with   Medical Management of Chronic Issues    Symptoms have been going on for awhile, was dx with upper respiratory infection, was given 10 day antibiotics, levaquin and prednisone, was tested for flu, covid, etc everything came back negative. Chest xray was given as well. 05/31/23, feels symptoms has become worse since then   Subjective    Discussed the use of AI scribe software for clinical note transcription with the patient, who gave verbal consent to proceed.  History of Present Illness   Miss Mesecher, a patient with a history of adult asthma and migraines, presents with a three-week history of upper respiratory symptoms, including a sore throat, headache, and sinus pressure. The symptoms began with a high fever and a urinary tract infection, for which she was treated at a walk-in clinic. Despite a 10-day course of Levaquin, her respiratory symptoms have not improved. She reports that her throat pain is localized to the left side and is associated with a sensation of her eyeballs 'floating in alcohol.' She also reports a low-grade fever and fatigue.  The patient's asthma symptoms have also been exacerbated, with a feeling of breathlessness despite an oxygen level of 99%. She has been using an albuterol inhaler for symptom relief but has recently run out. She also has a home nebulizer and has completed two treatments, which she reports make her nervous but do help.  She has been prescribed Singulair, but she has not started taking it due to concerns about potential side effects.  The patient has seen a pulmonologist, Dr. Meredeth Ide, for her asthma symptoms, which she believes she has had for many years. She reports that she uses her inhaler and Flonase when she gets sick. She  denies any history of smoking and allergy testing has shown no significant allergies. She also reports dizziness when turning over in bed to the left.       Medications: Outpatient Medications Prior to Visit  Medication Sig Note   chlorpheniramine-HYDROcodone (TUSSIONEX) 10-8 MG/5ML Take 5 mLs by mouth at bedtime as needed for cough.    EQ GENTLE LUBRICANT 0.3 % SOLN Place 1 drop into both eyes at bedtime as needed (dry/irritated eyes.).    estradiol (ESTRACE) 1 MG tablet TAKE 1 TABLET(1 MG) BY MOUTH DAILY    fluticasone (FLONASE) 50 MCG/ACT nasal spray Place 2 sprays into both nostrils daily.    Magnesium 250 MG TABS Take 500 mg by mouth every evening.     medroxyPROGESTERone (PROVERA) 2.5 MG tablet TAKE 1 TABLET(2.5 MG) BY MOUTH DAILY    meloxicam (MOBIC) 15 MG tablet TAKE 1 TABLET(15 MG) BY MOUTH DAILY    Multiple Vitamins-Minerals (ADULT GUMMY PO) Take 2 tablets by mouth every evening. Vitafusion MultiVites Gummy    naratriptan (AMERGE) 2.5 MG tablet Take by mouth.    NURTEC 75 MG TBDP Take by mouth daily.    omeprazole (PRILOSEC) 40 MG capsule Take 1 capsule (40 mg total) by mouth daily.    rosuvastatin (CRESTOR) 10 MG tablet TAKE 1 TABLET(10 MG) BY MOUTH DAILY    sertraline (ZOLOFT) 50 MG tablet TAKE 1 TABLET(50 MG) BY MOUTH AT BEDTIME    [DISCONTINUED] zolpidem (AMBIEN) 10 MG tablet TAKE 1 TABLET(10 MG) BY MOUTH AT BEDTIME AS NEEDED FOR  SLEEP    montelukast (SINGULAIR) 10 MG tablet Take 1 tablet (10 mg total) by mouth at bedtime. (Patient not taking: Reported on 06/12/2023)    [DISCONTINUED] amoxicillin-clavulanate (AUGMENTIN) 875-125 MG tablet Take 1 tablet by mouth 2 (two) times daily. (Patient not taking: Reported on 06/12/2023)    [DISCONTINUED] Galcanezumab-gnlm (EMGALITY) 120 MG/ML SOAJ Inject into the skin. (Patient not taking: Reported on 06/12/2023) 06/12/2023: previously stopped due to insurance   [DISCONTINUED] ondansetron (ZOFRAN-ODT) 4 MG disintegrating tablet Take 1 tablet (4 mg  total) by mouth every 6 (six) hours as needed for nausea or vomiting. (Patient not taking: Reported on 06/12/2023)    No facility-administered medications prior to visit.   Review of Systems     Objective    BP (!) 117/59 (BP Location: Left Arm, Patient Position: Sitting, Cuff Size: Large)   Pulse 75   Temp 99.2 F (37.3 C) (Oral)   Ht 5' 1.5" (1.562 m)   Wt 112 lb 6.4 oz (51 kg)   SpO2 100%   BMI 20.89 kg/m   Physical Exam  General Appearance:    Well developed, well nourished female, alert, cooperative, in no acute distress  HENT:   neck has bilateral anterior cervical nodes enlarged, pharynx erythematous without exudate, frontal sinuses tender, post nasal drip noted, and nasal mucosa congested  Eyes:    PERRL, conjunctiva/corneas clear, EOM's intact       Lungs:     Clear to auscultation bilaterally, respirations unlabored  Heart:    Normal heart rate. Normal rhythm. No murmurs, rubs, or gallops.    Neurologic:   Awake, alert, oriented x 3. No apparent focal neurological           defect.       Assessment & Plan        Upper Respiratory Infection Persistent symptoms for three weeks despite a 10-day course of Levaquin. Negative for COVID, RSV, and flu. Chest X-ray was normal. Symptoms include sore throat, headache, sinus pressure, and low energy. -Prescribe Augmentin twice daily.  Asthma Reports feeling short of breath despite normal oxygen saturation. Recently ran out of albuterol inhaler. Has a history of asthma diagnosed by a pulmonologist, but not on regular treatment. -Refill albuterol inhaler prescription. -Consider further evaluation and management with pulmonologist.  Chronic Insomnia Long-term use of Ambien, taken nightly. Patient expresses interest in discontinuing the medication but reports difficulty sleeping without it. -Approve refill of Ambien. -Consider discussing strategies for tapering off Ambien at the next wellness visit.  General Health  Maintenance -Continue monitoring for headaches. Current treatment is cost-prohibitive. No new plan discussed. -Consider discussing the use of Singulair for asthma at the next visit. The patient has a 90-day supply but has not started taking it due to concerns about side effects.    No follow-ups on file.      Mila Merry, MD  [facility] [phone] (phone) [phone] (fax)  [facility]

## 2023-06-23 DIAGNOSIS — H0289 Other specified disorders of eyelid: Secondary | ICD-10-CM | POA: Diagnosis not present

## 2023-06-23 DIAGNOSIS — H04123 Dry eye syndrome of bilateral lacrimal glands: Secondary | ICD-10-CM | POA: Diagnosis not present

## 2023-07-05 DIAGNOSIS — H04123 Dry eye syndrome of bilateral lacrimal glands: Secondary | ICD-10-CM | POA: Diagnosis not present

## 2023-07-05 DIAGNOSIS — H0289 Other specified disorders of eyelid: Secondary | ICD-10-CM | POA: Diagnosis not present

## 2023-07-07 ENCOUNTER — Other Ambulatory Visit: Payer: Self-pay | Admitting: Physician Assistant

## 2023-07-07 DIAGNOSIS — M19041 Primary osteoarthritis, right hand: Secondary | ICD-10-CM

## 2023-07-07 NOTE — Telephone Encounter (Signed)
Requested Prescriptions  Pending Prescriptions Disp Refills   meloxicam (MOBIC) 15 MG tablet [Pharmacy Med Name: MELOXICAM 15MG  TABLETS] 90 tablet 0    Sig: TAKE 1 TABLET(15 MG) BY MOUTH DAILY     Analgesics:  COX2 Inhibitors Failed - 07/07/2023  3:35 AM      Failed - Manual Review: Labs are only required if the patient has taken medication for more than 8 weeks.      Passed - HGB in normal range and within 360 days    Hemoglobin  Date Value Ref Range Status  07/20/2022 14.1 11.1 - 15.9 g/dL Final         Passed - Cr in normal range and within 360 days    Creatinine, Ser  Date Value Ref Range Status  07/20/2022 0.85 0.57 - 1.00 mg/dL Final         Passed - HCT in normal range and within 360 days    Hematocrit  Date Value Ref Range Status  07/20/2022 41.6 34.0 - 46.6 % Final         Passed - AST in normal range and within 360 days    AST  Date Value Ref Range Status  07/20/2022 20 0 - 40 IU/L Final         Passed - ALT in normal range and within 360 days    ALT  Date Value Ref Range Status  07/20/2022 18 0 - 32 IU/L Final         Passed - eGFR is 30 or above and within 360 days    GFR calc Af Amer  Date Value Ref Range Status  07/16/2020 92 >59 mL/min/1.73 Final    Comment:    **In accordance with recommendations from the NKF-ASN Task force,**   [facility] is in the process of updating its eGFR calculation to the   2021 CKD-EPI creatinine equation that estimates kidney function   without a race variable.    GFR calc non Af Amer  Date Value Ref Range Status  07/16/2020 80 >59 mL/min/1.73 Final   eGFR  Date Value Ref Range Status  07/20/2022 73 >59 mL/min/1.73 Final         Passed - Patient is not pregnant      Passed - Valid encounter within last 12 months    Recent Outpatient Visits           3 weeks ago Mild intermittent asthma without complication   [place] [facility] Malva Limes, MD   8 months ago Cough, unspecified type    [facility] [place], [place], PA-C   8 months ago Flu-like symptoms   [place] [facility] [place], [place], [place]   11 months ago Mixed hyperlipidemia   [facility] Merita Norton T, FNP   1 year ago Bacterial sinusitis   [place] [facility] Jacky Kindle, FNP       Future Appointments             In 2 weeks Debera Lat, PA-C [facility], [facility]

## 2023-07-10 ENCOUNTER — Other Ambulatory Visit: Payer: Self-pay | Admitting: Physician Assistant

## 2023-07-10 DIAGNOSIS — F419 Anxiety disorder, unspecified: Secondary | ICD-10-CM

## 2023-07-10 NOTE — Telephone Encounter (Signed)
Requested Prescriptions  Pending Prescriptions Disp Refills   sertraline (ZOLOFT) 50 MG tablet [Pharmacy Med Name: SERTRALINE 50MG  TABLETS] 90 tablet 0    Sig: TAKE 1 TABLET(50 MG) BY MOUTH AT BEDTIME     Psychiatry:  Antidepressants - SSRI - sertraline Passed - 07/10/2023  3:34 AM      Passed - AST in normal range and within 360 days    AST  Date Value Ref Range Status  07/20/2022 20 0 - 40 IU/L Final         Passed - ALT in normal range and within 360 days    ALT  Date Value Ref Range Status  07/20/2022 18 0 - 32 IU/L Final         Passed - Completed PHQ-2 or PHQ-9 in the last 360 days      Passed - Valid encounter within last 6 months    Recent Outpatient Visits           4 weeks ago Mild intermittent asthma without complication   [place] Malva Limes, MD   8 months ago Cough, unspecified type   [facility] [place], [place], PA-C   8 months ago Flu-like symptoms   [place] [facility] [place], [place], [place]   11 months ago Mixed hyperlipidemia   [facility] Merita Norton T, FNP   1 year ago Bacterial sinusitis   [place] [facility] Jacky Kindle, FNP       Future Appointments             In 2 weeks Debera Lat, PA-C [facility], [facility]

## 2023-07-12 ENCOUNTER — Other Ambulatory Visit: Payer: Self-pay | Admitting: Family Medicine

## 2023-07-12 DIAGNOSIS — G4700 Insomnia, unspecified: Secondary | ICD-10-CM

## 2023-07-13 NOTE — Telephone Encounter (Signed)
Requested medication (s) are due for refill today: yes  Requested medication (s) are on the active medication list: yes  Last refill:  06/12/23  Future visit scheduled: yes  Notes to clinic:  Unable to refill per protocol, cannot delegate.      Requested Prescriptions  Pending Prescriptions Disp Refills   zolpidem (AMBIEN) 10 MG tablet [Pharmacy Med Name: ZOLPIDEM 10MG  TABLETS] 30 tablet     Sig: TAKE 1/2 TO 1 TABLET(5 TO 10 MG) BY MOUTH AT BEDTIME AS NEEDED FOR SLEEP     Not Delegated - Psychiatry:  Anxiolytics/Hypnotics Failed - 07/12/2023 10:57 PM      Failed - This refill cannot be delegated      Failed - Urine Drug Screen completed in last 360 days      Passed - Valid encounter within last 6 months    Recent Outpatient Visits           1 month ago Mild intermittent asthma without complication   [place] [facility] Malva Limes, MD   8 months ago Cough, unspecified type   [facility] [place], [place], PA-C   8 months ago Flu-like symptoms   [place] [facility] [place], [place], [place]   11 months ago Mixed hyperlipidemia   [place] [facility] Merita Norton T, FNP   1 year ago Bacterial sinusitis   [place] [facility] Jacky Kindle, FNP       Future Appointments             In 1 week Debera Lat, PA-C [facility], [facility]

## 2023-07-24 NOTE — Progress Notes (Unsigned)
Complete physical exam  Patient: Brooke Mcdowell   DOB: 10-03-1951   72 y.o. Female  MRN: [id number] Visit Date: 07/25/2023  Today's healthcare provider: Debera Lat, PA-C   No chief complaint on file.  Subjective    Brooke Mcdowell is a 72 y.o. female who presents today for a complete physical exam.  She reports consuming a {diet types:17450} diet. {Exercise:19826} She generally feels {well/fairly well/poorly:18703}. She reports sleeping {well/fairly well/poorly:18703}. She {does/does not:200015} have additional problems to discuss today.  HPI  *** Discussed the use of AI scribe software for clinical note transcription with the patient, who gave verbal consent to proceed.  History of Present Illness            Last depression screening scores    12/19/2022    1:55 PM 10/21/2022    3:38 PM 07/20/2022    2:00 PM  PHQ 2/9 Scores  PHQ - 2 Score 0 0 0  PHQ- 9 Score 1 2 2    Last fall risk screening    12/19/2022    1:45 PM  Fall Risk   Falls in the past year? 0  Number falls in past yr: 0  Injury with Fall? 0  Risk for fall due to : No Fall Risks  Follow up Education provided;Falls prevention discussed   Last Audit-C alcohol use screening    10/21/2022    3:38 PM  Alcohol Use Disorder Test (AUDIT)  1. How often do you have a drink containing alcohol? 1  2. How many drinks containing alcohol do you have on a typical day when you are drinking? 0  3. How often do you have six or more drinks on one occasion? 0  AUDIT-C Score 1   A score of 3 or more in women, and 4 or more in men indicates increased risk for alcohol abuse, EXCEPT if all of the points are from question 1   Past Medical History:  Diagnosis Date   Ankle pain, left    previous strain   Arthritis    hands and back   Arthropathy of hand    Dysthymic disorder    Esophageal reflux    History of kidney stones    Insomnia    Lumbar herniated disc    Gets injections in back 3 times a year   Migraine     almost evrery day   Osteoporosis    Panic disorder    Pure hypercholesterolemia    Vertigo    with sinus infection   Vitamin D deficiency    Past Surgical History:  Procedure Laterality Date   CATARACT EXTRACTION W/PHACO Right 12/06/2018   Procedure: CATARACT EXTRACTION PHACO AND INTRAOCULAR LENS PLACEMENT (IOC)-RIGHT;  Surgeon: Elliot Cousin, MD;  Location: [facility] ORS;  Service: Ophthalmology;  Laterality: Right;  [place] 01:28.9 CDE 11.98 Fluid Pack Lot # [id number]   CATARACT EXTRACTION W/PHACO Left 09/15/2020   Procedure: CATARACT EXTRACTION PHACO AND INTRAOCULAR LENS PLACEMENT (IOC) LEFT 8.63 00:52.2;  Surgeon: Galen Manila, MD;  Location[facility];  Service: Ophthalmology;  Laterality: Left;   clot removal from right sinus after surgery to remove tumor in 1995     COLONOSCOPY WITH PROPOFOL N/A 07/22/2021   Procedure: COLONOSCOPY WITH PROPOFOL;  Surgeon: Wyline Mood, MD;  [facility];  Service: Gastroenterology;  Laterality: N/A;   LITHOTRIPSY  [phone]   renal stones   TUBAL LIGATION     TUMOR REMOVAL     behind right ete  in sinuses- 1995 Dr. Andee Poles   Social History   Socioeconomic History   Marital status: Divorced    Spouse name: Not on file   Number of children: 2   Years of education: Not on file   Highest education level: Some college, no degree  Occupational History   Occupation: retired  Tobacco Use   Smoking status: Never   Smokeless tobacco: Never  Vaping Use   Vaping status: Never Used  Substance and Sexual Activity   Alcohol use: Yes    Comment: rarely - 1 drinks    Drug use: No   Sexual activity: Not on file  Other Topics Concern   Not on file  Social History Narrative   Not on file   Social Determinants of Health   Financial Resource Strain: Low Risk  (12/19/2022)   Overall Financial Resource Strain (CARDIA)    Difficulty of Paying Living Expenses: Not hard at all  Food Insecurity: No Food Insecurity (12/19/2022)    Hunger Vital Sign    Worried About Running Out of Food in the Last Year: Never true    Ran Out of Food in the Last Year: Never true  Transportation Needs: No Transportation Needs (12/19/2022)   PRAPARE - Administrator, Civil Service (Medical): No    Lack of Transportation (Non-Medical): No  Physical Activity: Insufficiently Active (12/19/2022)   Exercise Vital Sign    Days of Exercise per Week: 3 days    Minutes of Exercise per Session: 30 min  Stress: No Stress Concern Present (12/19/2022)   [employer] of Occupational Health - Occupational Stress Questionnaire    Feeling of Stress : Not at all  Social Connections: Moderately Integrated (12/19/2022)   Social Connection and Isolation Panel [NHANES]    Frequency of Communication with Friends and Family: More than three times a week    Frequency of Social Gatherings with Friends and Family: Twice a week    Attends Religious Services: More than 4 times per year    Active Member of [employer] or Organizations: No    Attends Banker Meetings: Never    Marital Status: Living with partner  Intimate Partner Violence: Not At Risk (12/19/2022)   Humiliation, Afraid, Rape, and Kick questionnaire    Fear of Current or Ex-Partner: No    Emotionally Abused: No    Physically Abused: No    Sexually Abused: No   Family Status  Relation Name Status   Mother  Deceased   Father  Deceased   Sister  Alive   Brother half Alive  No partnership data on file   Family History  Problem Relation Age of Onset   Brain cancer Mother    Cancer Mother        lung   Cancer Father        lung    Brain cancer Father    Allergies  Allergen Reactions   Gabapentin Other (See Comments)    Caused migraines   Latex Itching and Other (See Comments)    Bandaids, redness   Tetracyclines & Related Itching and Nausea And Vomiting   Sulfa Antibiotics Rash    Patient Care Team: Debera Lat, PA-C as PCP - General (Physician  Assistant) Merri Ray, MD as Referring Physician (Physical Medicine and Rehabilitation) Elliot Cousin, MD (Inactive) as Consulting Physician (Ophthalmology) Mertie Moores, MD as Referring Physician (Specialist) Lonell Face, MD as Consulting Physician (Neurology) Dasher, Cliffton Asters, MD (Dermatology)   Medications: Outpatient  Medications Prior to Visit  Medication Sig   amoxicillin-clavulanate (AUGMENTIN) 875-125 MG tablet Take 1 tablet by mouth 2 (two) times daily.   chlorpheniramine-HYDROcodone (TUSSIONEX) 10-8 MG/5ML Take 5 mLs by mouth at bedtime as needed for cough.   EQ GENTLE LUBRICANT 0.3 % SOLN Place 1 drop into both eyes at bedtime as needed (dry/irritated eyes.).   estradiol (ESTRACE) 1 MG tablet TAKE 1 TABLET(1 MG) BY MOUTH DAILY   fluticasone (FLONASE) 50 MCG/ACT nasal spray Place 2 sprays into both nostrils daily.   Magnesium 250 MG TABS Take 500 mg by mouth every evening.    medroxyPROGESTERone (PROVERA) 2.5 MG tablet TAKE 1 TABLET(2.5 MG) BY MOUTH DAILY   meloxicam (MOBIC) 15 MG tablet TAKE 1 TABLET(15 MG) BY MOUTH DAILY   montelukast (SINGULAIR) 10 MG tablet Take 1 tablet (10 mg total) by mouth at bedtime. (Patient not taking: Reported on 06/12/2023)   Multiple Vitamins-Minerals (ADULT GUMMY PO) Take 2 tablets by mouth every evening. Vitafusion MultiVites Gummy   naratriptan (AMERGE) 2.5 MG tablet Take by mouth.   NURTEC 75 MG TBDP Take by mouth daily.   omeprazole (PRILOSEC) 40 MG capsule Take 1 capsule (40 mg total) by mouth daily.   predniSONE (STERAPRED UNI-PAK 21 TAB) 10 MG (21) TBPK tablet PO: Take 6 tablets on day 1:Take 5 tablets day 2:Take 4 tablets day 3: Take 3 tablets day 4:Take 2 tablets day five: 5 Take 1 tablet day 6   rosuvastatin (CRESTOR) 10 MG tablet TAKE 1 TABLET(10 MG) BY MOUTH DAILY   sertraline (ZOLOFT) 50 MG tablet TAKE 1 TABLET(50 MG) BY MOUTH AT BEDTIME   zolpidem (AMBIEN) 10 MG tablet TAKE 1/2 TO 1 TABLET(5 TO 10 MG) BY MOUTH AT BEDTIME AS  NEEDED FOR SLEEP   No facility-administered medications prior to visit.    Review of Systems Except see HPI  {Insert previous labs (optional):23779} {See past labs  Heme  Chem  Endocrine  Serology  Results Review (optional):1}  Objective    There were no vitals taken for this visit. {Insert last BP/Wt (optional):23777}{See vitals history (optional):1}    Physical Exam   No results found for any visits on 07/25/23.  Assessment & Plan    Routine Health Maintenance and Physical Exam  Exercise Activities and Dietary recommendations  Goals      DIET - EAT MORE FRUITS AND VEGETABLES     Increase water intake     Recommend increasing water intake to 4-6 glasses a day.          Immunization History  Administered Date(s) Administered   Fluad Quad(high Dose 65+) 07/06/2020, 07/15/2021   Influenza, High Dose Seasonal PF 07/20/2017, 06/07/2019   Influenza-Unspecified 06/29/2018, 07/22/2022   Moderna Sars-Covid-2 Vaccination 11/14/2019, 12/12/2019   Pneumococcal Conjugate-13 06/13/2017   Pneumococcal Polysaccharide-23 07/03/2018   Tdap 03/17/2017   Zoster, Live 04/26/2013    Health Maintenance  Topic Date Due   Zoster (Shingles) Vaccine (1 of 2) 11/30/1999   COVID-19 Vaccine (3 - 2023-24 season) 06/04/2023   Flu Shot  01/01/2024*   Mammogram  07/29/2023   Medicare Annual Wellness Visit  12/19/2023   DEXA scan (bone density measurement)  02/11/2027   DTaP/Tdap/Td vaccine (2 - Td or Tdap) 03/18/2027   Colon Cancer Screening  07/23/2031   Pneumonia Vaccine  Completed   Hepatitis C Screening  Completed   HPV Vaccine  Aged Out  *Topic was postponed. The date shown is not the original due date.    Discussed health  benefits of physical activity, and encouraged her to engage in regular exercise appropriate for her age and condition.  Assessment and Plan              ***  No follow-ups on file.     [facility]

## 2023-07-25 ENCOUNTER — Ambulatory Visit (INDEPENDENT_AMBULATORY_CARE_PROVIDER_SITE_OTHER): Payer: PPO | Admitting: Physician Assistant

## 2023-07-25 VITALS — BP 131/81 | HR 65 | Temp 98.6°F | Ht 61.5 in | Wt 114.0 lb

## 2023-07-25 DIAGNOSIS — Z0000 Encounter for general adult medical examination without abnormal findings: Secondary | ICD-10-CM | POA: Diagnosis not present

## 2023-07-26 ENCOUNTER — Encounter: Payer: Self-pay | Admitting: Physician Assistant

## 2023-07-27 DIAGNOSIS — H04123 Dry eye syndrome of bilateral lacrimal glands: Secondary | ICD-10-CM | POA: Diagnosis not present

## 2023-07-27 DIAGNOSIS — H538 Other visual disturbances: Secondary | ICD-10-CM | POA: Diagnosis not present

## 2023-07-27 DIAGNOSIS — H0289 Other specified disorders of eyelid: Secondary | ICD-10-CM | POA: Diagnosis not present

## 2023-07-30 DIAGNOSIS — M7661 Achilles tendinitis, right leg: Secondary | ICD-10-CM | POA: Insufficient documentation

## 2023-08-06 ENCOUNTER — Other Ambulatory Visit: Payer: Self-pay | Admitting: Physician Assistant

## 2023-08-06 DIAGNOSIS — R059 Cough, unspecified: Secondary | ICD-10-CM

## 2023-08-10 ENCOUNTER — Other Ambulatory Visit: Payer: Self-pay | Admitting: Physician Assistant

## 2023-08-10 DIAGNOSIS — G4700 Insomnia, unspecified: Secondary | ICD-10-CM

## 2023-08-10 NOTE — Telephone Encounter (Signed)
Requested medications are due for refill today.  yes  Requested medications are on the active medications list.  yes  Last refill. 07/14/2023 #30 0 rf  Future visit scheduled.   yes  Notes to clinic.  Refill not delegated.    Requested Prescriptions  Pending Prescriptions Disp Refills   zolpidem (AMBIEN) 10 MG tablet 30 tablet 0     Not Delegated - Psychiatry:  Anxiolytics/Hypnotics Failed - 08/10/2023  1:08 PM      Failed - This refill cannot be delegated      Failed - Urine Drug Screen completed in last 360 days      Passed - Valid encounter within last 6 months    Recent Outpatient Visits           2 weeks ago Annual physical exam   [place] [facility] [place], [place], PA-C   1 month ago Mild intermittent asthma without complication   [facility] Malva Limes, MD   9 months ago Cough, unspecified type   [facility] [place], [place], PA-C   9 months ago Flu-like symptoms   [place] [facility] [place], [place], PA-C   1 year ago Mixed hyperlipidemia   [facility] Jacky Kindle, FNP       Future Appointments             In 1 month Ost[place], Edmon Crape, PA-C [place] [facility]   In 11 months [place], [place], PA-C [facility]

## 2023-08-10 NOTE — Telephone Encounter (Signed)
Medication Refill -  Most Recent Primary Care Visit:  Provider: Debera Lat  Department: [facility]  Visit Type: PHYSICAL  Date: 07/25/2023  Medication: zolpidem (AMBIEN) 10 MG tablet [[id number]]   Has the patient contacted their pharmacy? Yes  (Agent: If yes, when and what did the pharmacy advise?) Contact pcp to send refill since medication hasn't been filled there before   Is this the correct pharmacy for this prescription? Yes  This is the patient's preferred pharmacy:  [facility] [id number] [place], [place] - [address] [place] [address] Phone: [phone] Fax: [phone]   Has the prescription been filled recently? Yes  Is the patient out of the medication? Yes  Has the patient been seen for an appointment in the last year OR does the patient have an upcoming appointment? Yes  Can we respond through MyChart? Yes  Agent: Please be advised that Rx refills may take up to 3 business days. We ask that you follow-up with your pharmacy.

## 2023-08-14 DIAGNOSIS — H5713 Ocular pain, bilateral: Secondary | ICD-10-CM | POA: Diagnosis not present

## 2023-08-14 DIAGNOSIS — G43719 Chronic migraine without aura, intractable, without status migrainosus: Secondary | ICD-10-CM | POA: Diagnosis not present

## 2023-08-14 DIAGNOSIS — M7918 Myalgia, other site: Secondary | ICD-10-CM | POA: Diagnosis not present

## 2023-08-14 DIAGNOSIS — M5481 Occipital neuralgia: Secondary | ICD-10-CM | POA: Diagnosis not present

## 2023-08-14 MED ORDER — ZOLPIDEM TARTRATE 10 MG PO TABS: ORAL_TABLET | ORAL | 0 refills | Status: DC

## 2023-08-15 DIAGNOSIS — M7661 Achilles tendinitis, right leg: Secondary | ICD-10-CM | POA: Diagnosis not present

## 2023-08-24 DIAGNOSIS — H04123 Dry eye syndrome of bilateral lacrimal glands: Secondary | ICD-10-CM | POA: Diagnosis not present

## 2023-08-24 DIAGNOSIS — M5481 Occipital neuralgia: Secondary | ICD-10-CM | POA: Diagnosis not present

## 2023-08-24 DIAGNOSIS — H0289 Other specified disorders of eyelid: Secondary | ICD-10-CM | POA: Diagnosis not present

## 2023-08-30 DIAGNOSIS — H16232 Neurotrophic keratoconjunctivitis, left eye: Secondary | ICD-10-CM | POA: Diagnosis not present

## 2023-08-30 DIAGNOSIS — M3501 Sicca syndrome with keratoconjunctivitis: Secondary | ICD-10-CM | POA: Diagnosis not present

## 2023-09-06 ENCOUNTER — Ambulatory Visit: Payer: Self-pay | Admitting: *Deleted

## 2023-09-06 ENCOUNTER — Encounter: Payer: Self-pay | Admitting: Nurse Practitioner

## 2023-09-06 ENCOUNTER — Ambulatory Visit (INDEPENDENT_AMBULATORY_CARE_PROVIDER_SITE_OTHER): Payer: PPO | Admitting: Nurse Practitioner

## 2023-09-06 VITALS — BP 112/68 | HR 70 | Temp 98.3°F | Resp 16 | Ht 61.5 in | Wt 113.4 lb

## 2023-09-06 DIAGNOSIS — R509 Fever, unspecified: Secondary | ICD-10-CM

## 2023-09-06 DIAGNOSIS — J4531 Mild persistent asthma with (acute) exacerbation: Secondary | ICD-10-CM | POA: Diagnosis not present

## 2023-09-06 DIAGNOSIS — R059 Cough, unspecified: Secondary | ICD-10-CM

## 2023-09-06 DIAGNOSIS — H66002 Acute suppurative otitis media without spontaneous rupture of ear drum, left ear: Secondary | ICD-10-CM | POA: Diagnosis not present

## 2023-09-06 DIAGNOSIS — R52 Pain, unspecified: Secondary | ICD-10-CM

## 2023-09-06 LAB — POCT INFLUENZA A/B
Influenza A, POC: NEGATIVE
Influenza B, POC: NEGATIVE

## 2023-09-06 MED ORDER — HYDROCOD POLI-CHLORPHE POLI ER 10-8 MG/5ML PO SUER: 5.0000 mL | Freq: Every evening | ORAL | 0 refills | Status: DC | PRN

## 2023-09-06 MED ORDER — TRELEGY ELLIPTA 100-62.5-25 MCG/ACT IN AEPB: 1.0000 | INHALATION_SPRAY | Freq: Every day | RESPIRATORY_TRACT | 0 refills | Status: DC

## 2023-09-06 MED ORDER — AMOXICILLIN-POT CLAVULANATE 875-125 MG PO TABS: 1.0000 | ORAL_TABLET | Freq: Two times a day (BID) | ORAL | 0 refills | Status: DC

## 2023-09-06 MED ORDER — PREDNISONE 10 MG (21) PO TBPK: ORAL_TABLET | ORAL | 0 refills | Status: DC

## 2023-09-06 NOTE — Telephone Encounter (Signed)
Message from Paradise M sent at 09/06/2023  8:46 AM EST  Summary: sore throat, sinus pressure, feels dizzy,fever of 99.9.   Pt stated she has a sore throat, sinus pressure, feels dizzy when getting up, and since last night has had a fever of 99.9.  Seeking clinical advice.          Call History  Contact Date/Time Type Contact Phone/Fax User  09/06/2023 08:45 AM EST Phone (Incoming) Brooke Mcdowell, Brooke Mcdowell (Self) [phone] Rexene Edison) McGill, Alondra   Reason for Disposition  [1] Sinus pain (not just congestion) AND [2] fever  Answer Assessment - Initial Assessment Questions 1. LOCATION: "Where does it hurt?"      Covid test negative.    I'm coughing, have sore throat,  2. ONSET: "When did the sinus pain start?"  (e.g., hours, days)      Coughing last 2 days.    Yesterday tired yesterday.   I'm having chills.   My fever 99.9.   I took 2 Tylenol.   I've coughed up all night.   I'm having pressure in my nose and my ears have pressure.    I'm having sinus headaches.      3. SEVERITY: "How bad is the pain?"   (Scale 1-10; mild, moderate or severe)   - MILD (1-3): doesn't interfere with normal activities    - MODERATE (4-7): interferes with normal activities (e.g., work or school) or awakens from sleep   - SEVERE (8-10): excruciating pain and patient unable to do any normal activities        Moderate 4. RECURRENT SYMPTOM: "Have you ever had sinus problems before?" If Yes, ask: "When was the last time?" and "What happened that time?"      Not asked 5. NASAL CONGESTION: "Is the nose blocked?" If Yes, ask: "Can you open it or must you breathe through your mouth?"     Yes sinus congestion 6. NASAL DISCHARGE: "Do you have discharge from your nose?" If so ask, "What color?"     Yes 7. FEVER: "Do you have a fever?" If Yes, ask: "What is it, how was it measured, and when did it start?"      Yes 99.9 8. OTHER SYMPTOMS: "Do you have any other symptoms?" (e.g., sore throat, cough, earache, difficulty  breathing)     Coughing a lot.    See above 9. PREGNANCY: "Is there any chance you are pregnant?" "When was your last menstrual period?"     N/A due to age  Protocols used: Sinus Pain or Congestion-A-AH

## 2023-09-06 NOTE — Telephone Encounter (Signed)
  Chief Complaint: Sinus pressure, headaches, fever, coughing, sore throat, dizzy when gets up. Symptoms: above Frequency: Started with a mild cough 2 days ago Pertinent Negatives: Patient denies trying anything OTC except Tylenol for the fever. Disposition: [] ED /[] Urgent Care (no appt availability in office) / [x] Appointment(In office/virtual)/ []  [facility] [facility]/ [] Home Care/ [] Refused Recommended Disposition /[] [place] [facility]/ []  Follow-up with PCP Additional Notes: No appts available with any of the providers at [facility] so scheduled her with Della Goo, FNP at [facility].   Pt. Verbalized she was familiar with the location of [facility].   I made sure she had the location correct and to go there instead of [facility].

## 2023-09-06 NOTE — Progress Notes (Signed)
BP 112/68 (BP Location: Right Arm, Patient Position: Sitting, Cuff Size: Normal)   Pulse 70   Temp 98.3 F (36.8 C) (Oral)   Resp 16   Ht 5' 1.5" (1.562 m) Comment: per patient  Wt 113 lb 6.4 oz (51.4 kg)   SpO2 99%   BMI 21.08 kg/m    Subjective:    Patient ID: Brooke Mcdowell, female    DOB: [birth year], 72 y.o.   MRN: [id number]  HPI: Brooke Mcdowell is a 72 y.o. female  Chief Complaint  Patient presents with   Cough   Fever   Generalized Body Aches    X2 days   Discussed the use of AI scribe software for clinical note transcription with the patient, who gave verbal consent to proceed.  History of Present Illness   The patient, with a history of asthma, presents with a cough and sore throat that started on Monday. They also report a headache that started last night and generalized body aches. They woke up at 3:30 AM with a temperature of 99.27F. They feel winded but their oxygen saturation is normal. They did a breathing treatment with albuterol via a nebulizer last night, but they are unsure if it helped. They are not currently taking Singulair for their asthma. They have been on several different steroid inhalers in the past but did not refill them. They also report left ear pain and clicking, and they feel like they are getting a low-grade fever.   Relevant past medical, surgical, family and social history reviewed and updated as indicated. Interim medical history since our last visit reviewed. Allergies and medications reviewed and updated.  Review of Systems  Ten systems reviewed and is negative except as mentioned in HPI       Objective:    BP 112/68 (BP Location: Right Arm, Patient Position: Sitting, Cuff Size: Normal)   Pulse 70   Temp 98.3 F (36.8 C) (Oral)   Resp 16   Ht 5' 1.5" (1.562 m) Comment: per patient  Wt 113 lb 6.4 oz (51.4 kg)   SpO2 99%   BMI 21.08 kg/m   Wt Readings from Last 3 Encounters:  09/06/23 113 lb 6.4 oz (51.4 kg)  07/25/23 114 lb  (51.7 kg)  06/12/23 112 lb 6.4 oz (51 kg)    Physical Exam   Constitutional: Patient appears well-developed and well-nourished.  No distress.  HEENT: head atraumatic, normocephalic, pupils equal and reactive to light, ears right TM clear, left TM erythematous and bulging, neck supple, throat erythematous,  lymphadenopathy  Cardiovascular: Normal rate, regular rhythm and normal heart sounds.  No murmur heard. No BLE edema. Pulmonary/Chest: Effort normal and breath sounds normal. No respiratory distress. Abdominal: Soft.  There is no tenderness. Psychiatric: Patient has a normal mood and affect. behavior is normal. Judgment and thought content normal.      Assessment & Plan:   Problem List Items Addressed This Visit       Respiratory   Mild persistent asthma with exacerbation   Relevant Medications   albuterol (PROVENTIL) (2.5 MG/3ML) 0.083% nebulizer solution   predniSONE (STERAPRED UNI-PAK 21 TAB) 10 MG (21) TBPK tablet   Fluticasone-Umeclidin-Vilant (TRELEGY ELLIPTA) 100-62.5-25 MCG/ACT AEPB   Other Visit Diagnoses     Fever, unspecified fever cause    -  Primary   Relevant Orders   POCT Influenza A/B (Completed)   Novel Coronavirus, NAA ([facility])   Body aches       Relevant Orders  POCT Influenza A/B (Completed)   Novel Coronavirus, NAA ([facility])   Cough, unspecified type       Relevant Medications   chlorpheniramine-HYDROcodone (TUSSIONEX) 10-8 MG/5ML   Other Relevant Orders   POCT Influenza A/B (Completed)   Novel Coronavirus, NAA ([facility])   Non-recurrent acute suppurative otitis media of left ear without spontaneous rupture of tympanic membrane       Relevant Medications   amoxicillin-clavulanate (AUGMENTIN) 875-125 MG tablet      Assessment and Plan    Acute Otitis Media Left ear pain with erythema and effusion on exam. -Start antibiotic therapy.  Upper Respiratory Infection Symptoms of cough, sore throat, and headache starting on Monday. Fever of 99.2F.  Throat appears irritated on exam. -can take mucinex, flonase, claritin or zyrtec  Asthma Reports of feeling winded and wheezing noted on exam. Patient has been using albuterol nebulizer as needed and has not been taking Singulair. -Start Trelegy inhaler once daily while sick and rinse mouth after use to prevent oral thrush. -Continue albuterol nebulizer as needed.  General Health Maintenance -Flu test negative, COVID swab results pending. -Check in with provider via message to update on condition. -Over the counter options for symptom relief include Zyrtec or Claritin, Flonase, and Mucinex.         Follow up plan: Return if symptoms worsen or fail to improve.

## 2023-09-07 LAB — NOVEL CORONAVIRUS, NAA: SARS-CoV-2, NAA: NOT DETECTED

## 2023-09-07 LAB — SPECIMEN STATUS REPORT

## 2023-09-11 ENCOUNTER — Ambulatory Visit: Payer: Self-pay

## 2023-09-11 ENCOUNTER — Encounter: Payer: Self-pay | Admitting: Physician Assistant

## 2023-09-11 NOTE — Telephone Encounter (Signed)
Summary: Sore throat advice   Pt is calling to report that her throat is still sore after day 6 on the medication. Patient would like to be reevaluated. Please advise       Chief Complaint: Seen 09/06/23 and started on antibiotic. Still has severe sore throat with ulcers in back, runny nose, ear pain. Still on antibiotic. Asking if she needs "something different." Symptoms: Fever over the weekend. Frequency: Last week Pertinent Negatives: Patient denies  Disposition: [] ED /[] Urgent Care (no appt availability in office) / [] Appointment(In office/virtual)/ []  [place] [facility]/ [] Home Care/ [x] Refused Recommended Disposition /[] [place] [facility]/ [x]  Follow-up with PCP Additional Notes: Please advise pt.  Reason for Disposition  SEVERE (e.g., excruciating) throat pain  Answer Assessment - Initial Assessment Questions 1. ONSET: "When did the throat start hurting?" (Hours or days ago)      Last week 2. SEVERITY: "How bad is the sore throat?" (Scale 1-10; mild, moderate or severe)   - MILD (1-3):  Doesn't interfere with eating or normal activities.   - MODERATE (4-7): Interferes with eating some solids and normal activities.   - SEVERE (8-10):  Excruciating pain, interferes with most normal activities.   - SEVERE WITH DYSPHAGIA (10): Can't swallow liquids, drooling.     Moderate 3. STREP EXPOSURE: "Has there been any exposure to strep within the past week?" If Yes, ask: "What type of contact occurred?"      No 4.  VIRAL SYMPTOMS: "Are there any symptoms of a cold, such as a runny nose, cough, hoarse voice or red eyes?"      Runny nose 5. FEVER: "Do you have a fever?" If Yes, ask: "What is your temperature, how was it measured, and when did it start?"     Weekend 6. PUS ON THE TONSILS: "Is there pus on the tonsils in the back of your throat?"     Ulcers 7. OTHER SYMPTOMS: "Do you have any other symptoms?" (e.g., difficulty breathing, headache, rash)     Ear pain 8. PREGNANCY:  "Is there any chance you are pregnant?" "When was your last menstrual period?"     No  Protocols used: Sore Throat-A-AH

## 2023-09-14 ENCOUNTER — Other Ambulatory Visit: Payer: Self-pay | Admitting: Physician Assistant

## 2023-09-14 DIAGNOSIS — G4700 Insomnia, unspecified: Secondary | ICD-10-CM

## 2023-09-14 NOTE — Telephone Encounter (Signed)
Requested medications are due for refill today.  yes  Requested medications are on the active medications list.  yes  Last refill. 08/14/2023 #30 0 rf  Future visit scheduled.   yes  Notes to clinic.  Refill not delegated.    Requested Prescriptions  Pending Prescriptions Disp Refills   zolpidem (AMBIEN) 10 MG tablet [Pharmacy Med Name: ZOLPIDEM TARTRATE 10 MG TABLET] 30 tablet     Sig: TAKE A HALF TO 1 TABLET BY MOUTH BY MOUTH AT BEDTIME AS NEEDED FOR SLEEP     Not Delegated - Psychiatry:  Anxiolytics/Hypnotics Failed - 09/14/2023 11:43 AM      Failed - This refill cannot be delegated      Failed - Urine Drug Screen completed in last 360 days      Passed - Valid encounter within last 6 months    Recent Outpatient Visits           1 week ago Fever, unspecified fever cause   [facility] Berniece Salines, FNP   1 month ago Annual physical exam   [place] [facility] [place], [place], PA-C   3 months ago Mild intermittent asthma without complication   [facility] Malva Limes, MD   10 months ago Cough, unspecified type   [facility] [place], [place], PA-C   10 months ago Flu-like symptoms   [facility] [place], [place], PA-C       Future Appointments             In 10 months [address], [place], PA-C [facility], [facility]

## 2023-09-18 DIAGNOSIS — R059 Cough, unspecified: Secondary | ICD-10-CM | POA: Diagnosis not present

## 2023-09-18 DIAGNOSIS — J069 Acute upper respiratory infection, unspecified: Secondary | ICD-10-CM | POA: Diagnosis not present

## 2023-09-18 NOTE — Telephone Encounter (Signed)
3rd notification from Karin Golden asking for refills on Zolpidem Tartrate 10 mg. #30

## 2023-09-18 NOTE — Telephone Encounter (Signed)
Please advise 

## 2023-09-25 ENCOUNTER — Ambulatory Visit: Payer: PPO | Admitting: Physician Assistant

## 2023-10-05 DIAGNOSIS — J22 Unspecified acute lower respiratory infection: Secondary | ICD-10-CM | POA: Diagnosis not present

## 2023-10-05 DIAGNOSIS — J069 Acute upper respiratory infection, unspecified: Secondary | ICD-10-CM | POA: Diagnosis not present

## 2023-10-05 DIAGNOSIS — R053 Chronic cough: Secondary | ICD-10-CM | POA: Diagnosis not present

## 2023-10-06 ENCOUNTER — Other Ambulatory Visit: Payer: Self-pay | Admitting: Physician Assistant

## 2023-10-06 ENCOUNTER — Other Ambulatory Visit: Payer: Self-pay | Admitting: Family Medicine

## 2023-10-06 DIAGNOSIS — F419 Anxiety disorder, unspecified: Secondary | ICD-10-CM

## 2023-10-06 DIAGNOSIS — E7800 Pure hypercholesterolemia, unspecified: Secondary | ICD-10-CM

## 2023-10-09 NOTE — Telephone Encounter (Signed)
 Requested medications are due for refill today.  yes  Requested medications are on the active medications list.  yes  Last refill. 07/10/2023 #90 0 rf  Future visit scheduled.   yes  Notes to clinic.  Labs are expired.    Requested Prescriptions  Pending Prescriptions Disp Refills   sertraline  (ZOLOFT ) 50 MG tablet [Pharmacy Med Name: SERTRALINE  50MG  TABLETS] 90 tablet 0    Sig: TAKE 1 TABLET(50 MG) BY MOUTH AT BEDTIME     Psychiatry:  Antidepressants - SSRI - sertraline  Failed - 10/09/2023  4:43 PM      Failed - AST in normal range and within 360 days    AST  Date Value Ref Range Status  07/20/2022 20 0 - 40 IU/L Final         Failed - ALT in normal range and within 360 days    ALT  Date Value Ref Range Status  07/20/2022 18 0 - 32 IU/L Final         Passed - Completed PHQ-2 or PHQ-9 in the last 360 days      Passed - Valid encounter within last 6 months    Recent Outpatient Visits           1 month ago Fever, unspecified fever cause   [facility] Gareth Mliss FALCON, FNP   2 months ago Annual physical exam   [facility] [place], [place], PA-C   3 months ago Mild intermittent asthma without complication   [facility] Nancyann BRAVO, MD   11 months ago Cough, unspecified type   [facility] [place], [place], PA-C   11 months ago Flu-like symptoms   [place] [place], [place], PA-C       Future Appointments             In 9 months Ostwalt, Janna, PA-C [facility], [facility]

## 2023-10-10 NOTE — Telephone Encounter (Signed)
 If symptoms worsen needs to be seen sooner. Advised not to take zoloft and meloxicam at the same time.

## 2023-10-16 DIAGNOSIS — G43719 Chronic migraine without aura, intractable, without status migrainosus: Secondary | ICD-10-CM | POA: Diagnosis not present

## 2023-10-16 DIAGNOSIS — J019 Acute sinusitis, unspecified: Secondary | ICD-10-CM | POA: Diagnosis not present

## 2023-10-16 DIAGNOSIS — M542 Cervicalgia: Secondary | ICD-10-CM | POA: Diagnosis not present

## 2023-10-16 DIAGNOSIS — K219 Gastro-esophageal reflux disease without esophagitis: Secondary | ICD-10-CM | POA: Diagnosis not present

## 2023-10-16 DIAGNOSIS — B9689 Other specified bacterial agents as the cause of diseases classified elsewhere: Secondary | ICD-10-CM | POA: Diagnosis not present

## 2023-10-16 DIAGNOSIS — M7918 Myalgia, other site: Secondary | ICD-10-CM | POA: Diagnosis not present

## 2023-11-08 ENCOUNTER — Other Ambulatory Visit: Payer: Self-pay | Admitting: Physician Assistant

## 2023-11-08 DIAGNOSIS — G4700 Insomnia, unspecified: Secondary | ICD-10-CM

## 2023-12-07 ENCOUNTER — Other Ambulatory Visit: Payer: Self-pay | Admitting: Physician Assistant

## 2023-12-07 DIAGNOSIS — G4700 Insomnia, unspecified: Secondary | ICD-10-CM

## 2023-12-07 NOTE — Telephone Encounter (Signed)
 Requested medication (s) are due for refill today -yes  Requested medication (s) are on the active medication list -yes  Future visit scheduled -yes  Last refill: 11/13/23 #30  Notes to clinic: non delegated Rx  Requested Prescriptions  Pending Prescriptions Disp Refills   zolpidem (AMBIEN) 10 MG tablet [Pharmacy Med Name: ZOLPIDEM TARTRATE 10 MG TABLET] 30 tablet     Sig: TAKE A HALF TO 1 TABLET BY MOUTH AT BEDTIME AS NEEDED FOR SLEEP     Not Delegated - Psychiatry:  Anxiolytics/Hypnotics Failed - 12/07/2023  2:29 PM      Failed - This refill cannot be delegated      Failed - Urine Drug Screen completed in last 360 days      Passed - Valid encounter within last 6 months    Recent Outpatient Visits           3 months ago Fever, unspecified fever cause   [facility] Berniece Salines, FNP   4 months ago Annual physical exam   [facility] [place], [place], PA-C   5 months ago Mild intermittent asthma without complication   [facility] Malva Limes, MD   1 year ago Cough, unspecified type   [place] [facility] [place], [place], PA-C   1 year ago Flu-like symptoms   [place] [facility] [place], [place], PA-C       Future Appointments             In 7 months Ostwalt, [place], PA-C [facility], [facility]               Requested Prescriptions  Pending Prescriptions Disp Refills   zolpidem (AMBIEN) 10 MG tablet [Pharmacy Med Name: ZOLPIDEM TARTRATE 10 MG TABLET] 30 tablet     Sig: TAKE A HALF TO 1 TABLET BY MOUTH AT BEDTIME AS NEEDED FOR SLEEP     Not Delegated - Psychiatry:  Anxiolytics/Hypnotics Failed - 12/07/2023  2:29 PM      Failed - This refill cannot be delegated      Failed - Urine Drug Screen completed in last 360 days      Passed - Valid encounter within last 6 months    Recent Outpatient Visits           3 months ago  Fever, unspecified fever cause   [facility] Berniece Salines, FNP   4 months ago Annual physical exam   [facility] [place], [place], PA-C   5 months ago Mild intermittent asthma without complication   [facility] Malva Limes, MD   1 year ago Cough, unspecified type   [facility] [place], [place], PA-C   1 year ago Flu-like symptoms   [facility] [place], [place], PA-C       Future Appointments             In 7 months Ostwalt, Edmon Crape, PA-C [facility], [facility]

## 2023-12-13 ENCOUNTER — Encounter: Payer: Self-pay | Admitting: Emergency Medicine

## 2023-12-18 DIAGNOSIS — M5416 Radiculopathy, lumbar region: Secondary | ICD-10-CM | POA: Insufficient documentation

## 2023-12-18 DIAGNOSIS — M160 Bilateral primary osteoarthritis of hip: Secondary | ICD-10-CM | POA: Diagnosis not present

## 2023-12-20 ENCOUNTER — Ambulatory Visit: Payer: PPO | Admitting: Emergency Medicine

## 2023-12-20 VITALS — Ht 61.5 in | Wt 110.0 lb

## 2023-12-20 DIAGNOSIS — Z0000 Encounter for general adult medical examination without abnormal findings: Secondary | ICD-10-CM | POA: Diagnosis not present

## 2023-12-20 DIAGNOSIS — Z1231 Encounter for screening mammogram for malignant neoplasm of breast: Secondary | ICD-10-CM

## 2023-12-20 NOTE — Patient Instructions (Addendum)
 Brooke Mcdowell , Thank you for taking time to come for your Medicare Wellness Visit. I appreciate your ongoing commitment to your health goals. Please review the following plan we discussed and let me know if I can assist you in the future.   Referrals/Orders/Follow-Ups/Clinician Recommendations: Get the shingles vaccine at your local pharmacy. I have placed an order for a mammogram. Call [facility] @ [phone] to schedule.  This is a list of the screening recommended for you and due dates:  Health Maintenance  Topic Date Due   Zoster (Shingles) Vaccine (1 of 2) 11/30/1999   COVID-19 Vaccine (3 - 2024-25 season) 06/04/2023   Mammogram  07/29/2023   Flu Shot  01/01/2024*   Medicare Annual Wellness Visit  12/19/2024   DEXA scan (bone density measurement)  02/11/2027   DTaP/Tdap/Td vaccine (2 - Td or Tdap) 03/18/2027   Colon Cancer Screening  07/23/2031   Pneumonia Vaccine  Completed   Hepatitis C Screening  Completed   HPV Vaccine  Aged Out  *Topic was postponed. The date shown is not the original due date.    Advanced directives: (Copy Requested) Please bring a copy of your health care power of attorney and living will to the office to be added to your chart at your convenience. You can mail to [facility] [address] [place], [place] [address] or email to [email or web address]  Next Medicare Annual Wellness Visit scheduled for next year: Yes, 12/25/24 @ 1:50pm (phone visit)

## 2023-12-20 NOTE — Progress Notes (Signed)
 Subjective:   Brooke Mcdowell is a 73 y.o. who presents for a Medicare Wellness preventive visit.  Visit Complete: Virtual I connected with  Brooke Mcdowell on 12/20/23 by a audio enabled telemedicine application and verified that I am speaking with the correct person using two identifiers.  Patient Location: Home  Provider Location: Home Office  I discussed the limitations of evaluation and management by telemedicine. The patient expressed understanding and agreed to proceed.  Vital Signs: Because this visit was a virtual/telehealth visit, some criteria may be missing or patient reported. Any vitals not documented were not able to be obtained and vitals that have been documented are patient reported.  VideoDeclined- This patient declined Librarian, academic. Therefore the visit was completed with audio only.  Persons Participating in Visit: Patient.  AWV Questionnaire: No: Patient Medicare AWV questionnaire was not completed prior to this visit.  Cardiac Risk Factors include: advanced age (>29men, >17 women);dyslipidemia     Objective:    Today's Vitals   12/20/23 1402  Weight: 110 lb (49.9 kg)  Height: 5' 1.5" (1.562 m)  PainSc: 5    Body mass index is 20.45 kg/m.     12/20/2023    2:16 PM 12/19/2022    1:59 PM 12/14/2021    2:30 PM 07/22/2021    7:00 AM 09/15/2020    8:14 AM 06/22/2020    2:08 PM 06/17/2019    2:19 PM  Advanced Directives  Does Patient Have a Medical Advance Directive? Yes Yes No Yes Yes Yes Yes  Type of Estate agent of [place];Living will    Healthcare Power of [place];Living will Healthcare Power of [place];Living will Healthcare Power of [place];Living will  Does patient want to make changes to medical advance directive? No - Patient declined    Yes (MAU/Ambulatory/Procedural Areas - Information given)    Copy of Healthcare Power of Attorney in Chart? No - copy requested    No - copy requested No -  copy requested No - copy requested  Would patient like information on creating a medical advance directive?   No - Patient declined        Current Medications (verified) Outpatient Encounter Medications as of 12/20/2023  Medication Sig   albuterol (PROVENTIL) (2.5 MG/3ML) 0.083% nebulizer solution Inhale into the lungs.   EQ GENTLE LUBRICANT 0.3 % SOLN Place 1 drop into both eyes at bedtime as needed (dry/irritated eyes.).   estradiol (ESTRACE) 1 MG tablet TAKE 1 TABLET(1 MG) BY MOUTH DAILY   fluticasone (FLONASE) 50 MCG/ACT nasal spray Place 2 sprays into both nostrils daily.   Magnesium 250 MG TABS Take 500 mg by mouth every evening.    medroxyPROGESTERone (PROVERA) 2.5 MG tablet TAKE 1 TABLET(2.5 MG) BY MOUTH DAILY   meloxicam (MOBIC) 15 MG tablet TAKE 1 TABLET(15 MG) BY MOUTH DAILY   methocarbamol (ROBAXIN) 500 MG tablet Take 500 mg by mouth 3 (three) times daily.   Multiple Vitamins-Minerals (ADULT GUMMY PO) Take 2 tablets by mouth every evening. Vitafusion MultiVites Gummy   omeprazole (PRILOSEC) 40 MG capsule Take 1 capsule (40 mg total) by mouth daily.   predniSONE (STERAPRED UNI-PAK 21 TAB) 10 MG (21) TBPK tablet Take as directed on package.  (60 mg po on day 1, 50 mg po on day 2...)   rizatriptan (MAXALT) 10 MG tablet Take by mouth.   rosuvastatin (CRESTOR) 10 MG tablet TAKE 1 TABLET(10 MG) BY MOUTH DAILY   sertraline (ZOLOFT) 50 MG tablet  TAKE 1 TABLET(50 MG) BY MOUTH AT BEDTIME   zolpidem (AMBIEN) 10 MG tablet TAKE A HALF TO 1 TABLET BY MOUTH AT BEDTIME AS NEEDED FOR SLEEP   amoxicillin-clavulanate (AUGMENTIN) 875-125 MG tablet Take 1 tablet by mouth 2 (two) times daily. (Patient not taking: Reported on 12/20/2023)   chlorpheniramine-HYDROcodone (TUSSIONEX) 10-8 MG/5ML Take 5 mLs by mouth at bedtime as needed for cough. (Patient not taking: Reported on 12/20/2023)   Fluticasone-Umeclidin-Vilant (TRELEGY ELLIPTA) 100-62.5-25 MCG/ACT AEPB Inhale 1 puff into the lungs daily. (Patient  not taking: Reported on 12/20/2023)   HYDROMET 5-1.5 MG/5ML syrup Take 5 mLs by mouth at bedtime as needed. (Patient not taking: Reported on 12/20/2023)   naratriptan (AMERGE) 2.5 MG tablet Take by mouth. (Patient not taking: Reported on 12/20/2023)   NURTEC 75 MG TBDP Take by mouth daily. (Patient not taking: Reported on 12/20/2023)   No facility-administered encounter medications on file as of 12/20/2023.    Allergies (verified) Gabapentin, Latex, Tetracyclines & related, and Sulfa antibiotics   History: Past Medical History:  Diagnosis Date   Ankle pain, left    previous strain   Arthritis    hands and back   Arthropathy of hand    Dysthymic disorder    Esophageal reflux    History of kidney stones    Insomnia    Lumbar herniated disc    Gets injections in back 3 times a year   Migraine    almost evrery day   Osteoporosis    Panic disorder    Pure hypercholesterolemia    Vertigo    with sinus infection   Vitamin D deficiency    Past Surgical History:  Procedure Laterality Date   CATARACT EXTRACTION W/PHACO Right 12/06/2018   Procedure: CATARACT EXTRACTION PHACO AND INTRAOCULAR LENS PLACEMENT (IOC)-RIGHT;  Surgeon: Elliot Cousin, MD;  Location: [facility] ORS;  Service: Ophthalmology;  Laterality: Right;  [place] 01:28.9 CDE 11.98 Fluid Pack Lot # [id number]   CATARACT EXTRACTION W/PHACO Left 09/15/2020   Procedure: CATARACT EXTRACTION PHACO AND INTRAOCULAR LENS PLACEMENT (IOC) LEFT 8.63 00:52.2;  Surgeon: Galen Manila, MD;  Location: [facility];  Service: Ophthalmology;  Laterality: Left;   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 Andee Poles    Family History  Problem Relation Age of Onset   Brain cancer Mother    Cancer Mother        lung   Cancer Father        lung    Brain cancer Father    Social History   Socioeconomic History   Marital status: Significant Other    Spouse name: Not on file   Number of children: 2   Years of education: Not on file   Highest education level: Some college, no degree  Occupational History   Occupation: retired  Tobacco Use   Smoking status: Never   Smokeless tobacco: Never  Vaping Use   Vaping status: Never Used  Substance and Sexual Activity   Alcohol use: Yes    Comment: rarely - 1 drinks    Drug use: No   Sexual activity: Not on file  Other Topics Concern   Not on file  Social History Narrative   Lives with fiance   Social Drivers of Health   Financial Resource Strain: Low Risk  (12/20/2023)   Overall Financial Resource Strain (CARDIA)    Difficulty of Paying Living Expenses: Not hard at all  Food Insecurity: No Food Insecurity (12/20/2023)   Hunger Vital Sign    Worried About Running Out of Food in the Last Year: Never true    Ran Out of Food in the Last Year: Never true  Transportation Needs: No Transportation Needs (12/20/2023)   PRAPARE - Administrator, Civil Service (Medical): No    Lack of Transportation (Non-Medical): No  Physical Activity: Insufficiently Active (12/20/2023)   Exercise Vital Sign    Days of Exercise per Week: 3 days    Minutes of Exercise per Session: 20 min  Stress: No Stress Concern Present (12/20/2023)   [employer] of Occupational Health - Occupational Stress Questionnaire    Feeling of Stress : Not at all  Social Connections: Moderately Integrated (12/20/2023)   Social Connection and Isolation Panel [NHANES]    Frequency of Communication with Friends and Family: More than three times a week    Frequency of Social Gatherings with Friends and Family: Twice a week    Attends Religious Services: More than 4 times per year     Active Member of [employer] or Organizations: No    Attends Engineer, structural: Never    Marital Status: Living with partner    Tobacco Counseling Counseling given: Not Answered    Clinical Intake:  Pre-visit preparation completed: Yes  Pain : 0-10 Pain Score: 5  Pain Type: Chronic pain Pain Location: Back Pain Descriptors / Indicators: Aching     BMI - recorded: 20.45 Nutritional Status: BMI of 19-24  Normal Nutritional Risks: None Diabetes: No  No results found for: "HGBA1C"   How often do you need to have someone help you when you read instructions, pamphlets, or other written materials from your doctor or pharmacy?: 1 - Never  Interpreter Needed?: No  Information entered by :: Tora Kindred, CMA   Activities of Daily Living     12/20/2023    2:04 PM 09/06/2023   10:49 AM  In your present state of health, do you have any difficulty performing the following activities:  Hearing? 0 0  Vision? 0 0  Difficulty concentrating or making decisions? 0 0  Walking or climbing stairs? 0 0  Dressing or bathing? 0 0  Doing errands, shopping? 0 0  Preparing Food and eating ? N   Using the Toilet? N   In the past six months, have you accidently leaked urine? N   Do you have problems with loss of bowel control? N   Managing your Medications? N   Managing your Finances? N   Housekeeping or managing your Housekeeping? N     Patient Care Team: Debera Lat, PA-C as PCP - General (Physician Assistant) Merri Ray, MD as Referring Physician (Physical Medicine and Rehabilitation) Elliot Cousin, MD (Inactive) as Consulting Physician (Ophthalmology) Mertie Moores, MD as Referring Physician (Specialist) Lonell Face, MD as Consulting Physician (Neurology) Dasher, Cliffton Asters, MD (Dermatology) Galen Manila, MD as Referring Physician (Ophthalmology)  Indicate any recent Medical Services you may have received from other than [facility] providers in the past  year (date may be approximate).     Assessment:   This is a routine wellness examination for [place].  Hearing/Vision screen Hearing Screening - Comments:: Denies hearing loss Vision Screening - Comments:: Gets eye exams, Dr. Druscilla Brownie @ [place], [place]   Goals Addressed             This Visit's Progress    DIET - INCREASE WATER INTAKE         Depression Screen     12/20/2023    2:14 PM 09/06/2023   10:49 AM 07/25/2023    2:19 PM 12/19/2022    1:55 PM 10/21/2022    3:38 PM 07/20/2022    2:00 PM 12/14/2021    2:28 PM  PHQ 2/9 Scores  PHQ - 2 Score 0 0 0 0 0 0 0  PHQ- 9 Score 2   1 2 2      Fall Risk     12/20/2023    2:17 PM 09/06/2023   10:49 AM 07/25/2023    2:18 PM 12/19/2022    1:45 PM 10/21/2022    3:38 PM  Fall Risk   Falls in the past year? 0 0 0 0 0  Number falls in past yr: 0  0 0 0  Injury with Fall? 0  0 0 0  Risk for fall due to : No Fall Risks No Fall Risks No Fall Risks No Fall Risks No Fall Risks  Follow up Falls prevention discussed;Falls evaluation completed Falls prevention discussed  Education provided;Falls prevention discussed Falls evaluation completed    MEDICARE RISK AT HOME:  Medicare Risk at Home Any stairs in or around the home?: Yes (1 or 2 steps) If so, are there any without handrails?: Yes Home free of loose throw rugs in walkways, pet beds, electrical cords, etc?: Yes Adequate lighting in your home to reduce risk of falls?: Yes Life alert?: No Use of a cane, walker or w/c?: No Grab bars in the bathroom?: Yes Shower chair or bench in shower?: Yes Elevated toilet seat or a handicapped toilet?: Yes  TIMED UP AND GO:  Was the test performed?  No  Cognitive Function: 6CIT completed        12/20/2023    2:18 PM 12/19/2022    2:04 PM 07/14/2020    2:12 PM 09/17/2019    1:58 PM 06/13/2017    2:25 PM  6CIT Screen  What Year? 0 points 0 points 0 points 0 points 0 points  What month? 0 points 0 points 0 points 0 points 0  points  What time? 0 points 0 points 0 points 0 points 0 points  Count back from 20 0 points 0 points 0 points 0 points 0 points  Months in reverse 0 points 0 points 0 points 2 points 0 points  Repeat phrase 0 points 0 points 0 points 4 points 4 points  Total Score 0 points 0 points 0 points 6 points 4 points    Immunizations Immunization History  Administered Date(s) Administered   Fluad Quad(high Dose 65+) 07/06/2020, 07/15/2021   Fluad Trivalent(High Dose 65+) 06/29/2018   Influenza, High Dose Seasonal PF 07/20/2017, 06/07/2019   Influenza-Unspecified 06/29/2018, 07/22/2022   Moderna Sars-Covid-2 Vaccination 11/14/2019, 12/12/2019   Pneumococcal Conjugate-13 06/13/2017   Pneumococcal Polysaccharide-23 07/03/2018   Tdap 03/17/2017   Zoster, Live 04/26/2013    Screening Tests Health Maintenance  Topic Date Due   Zoster Vaccines- Shingrix (1 of 2) 11/30/1999   COVID-19 Vaccine (3 - 2024-25 season) 06/04/2023   MAMMOGRAM  07/29/2023   INFLUENZA VACCINE  01/01/2024 (Originally 05/04/2023)   Medicare Annual Wellness (AWV)  12/19/2024   DEXA SCAN  02/11/2027   DTaP/Tdap/Td (2 - Td or Tdap) 03/18/2027   Colonoscopy  07/23/2031   Pneumonia Vaccine 20+ Years old  Completed   Hepatitis C Screening  Completed   HPV VACCINES  Aged Out    Health Maintenance  Health Maintenance Due  Topic Date Due   Zoster Vaccines- Shingrix (1 of 2) 11/30/1999   COVID-19 Vaccine (3 - 2024-25 season) 06/04/2023   MAMMOGRAM  07/29/2023   Health Maintenance Items Addressed: Mammogram ordered, See Nurse Notes  Additional Screening:  Vision Screening: Recommended annual ophthalmology exams for early detection of glaucoma and other disorders of the eye.  Dental Screening: Recommended annual dental exams for proper oral hygiene  Community Resource Referral / Chronic Care Management: CRR required this visit?  No   CCM required this visit?  No     Plan:     I have personally reviewed and  noted the following in the patient's chart:   Medical and social history Use of alcohol, tobacco or illicit drugs  Current medications and supplements including opioid prescriptions. Patient is not currently taking opioid prescriptions. Functional ability and status Nutritional status Physical activity Advanced directives List of other physicians Hospitalizations, surgeries, and ER visits in previous 12 months Vitals Screenings to include cognitive, depression, and falls Referrals and appointments  In addition, I have reviewed and discussed with patient certain preventive protocols, quality metrics, and best practice recommendations. A written personalized care plan for preventive services as well as general preventive health recommendations were provided to patient.     Tora Kindred, CMA   12/20/2023   After Visit Summary: (MyChart) Due to this being a telephonic visit, the after visit summary with patients personalized plan was offered to patient via MyChart   Notes:  Needs shingles vaccine (pharmacy) Placed order for [facility] Declined Covid Last colonoscopy 07/22/21, no repeat per GI note

## 2024-01-02 DIAGNOSIS — M545 Low back pain, unspecified: Secondary | ICD-10-CM | POA: Diagnosis not present

## 2024-01-04 ENCOUNTER — Other Ambulatory Visit: Payer: Self-pay | Admitting: Physician Assistant

## 2024-01-04 DIAGNOSIS — F419 Anxiety disorder, unspecified: Secondary | ICD-10-CM

## 2024-01-05 ENCOUNTER — Other Ambulatory Visit: Payer: Self-pay | Admitting: Physician Assistant

## 2024-01-05 DIAGNOSIS — G4700 Insomnia, unspecified: Secondary | ICD-10-CM

## 2024-01-05 NOTE — Telephone Encounter (Signed)
 Requested medication (s) are due for refill today: yes  Requested medication (s) are on the active medication list: yes  Last refill:  10/10/23 #90  Future visit scheduled: yes  Notes to clinic:  overdue lab work    Requested Prescriptions  Pending Prescriptions Disp Refills   sertraline (ZOLOFT) 50 MG tablet [Pharmacy Med Name: SERTRALINE 50MG  TABLETS] 90 tablet 0    Sig: TAKE 1 TABLET(50 MG) BY MOUTH AT BEDTIME     Psychiatry:  Antidepressants - SSRI - sertraline Failed - 01/05/2024 10:25 AM      Failed - AST in normal range and within 360 days    AST  Date Value Ref Range Status  07/20/2022 20 0 - 40 IU/L Final         Failed - ALT in normal range and within 360 days    ALT  Date Value Ref Range Status  07/20/2022 18 0 - 32 IU/L Final         Passed - Completed PHQ-2 or PHQ-9 in the last 360 days      Passed - Valid encounter within last 6 months    Recent Outpatient Visits   None     Future Appointments             In 6 months Ostwalt, Edmon Crape, PA-C [facility]

## 2024-01-08 NOTE — Telephone Encounter (Signed)
 2nd Request .[facility] faxed refill request for the following medications:   zolpidem (AMBIEN) 10 MG tablet   Please advise

## 2024-01-08 NOTE — Telephone Encounter (Signed)
 Requested medications are due for refill today.  yes  Requested medications are on the active medications list.  yes  Last refill. 12/11/2023 #30 0 rf  Future visit scheduled.   yes  Notes to clinic.  Refill/refusal not delegated.     Requested Prescriptions  Pending Prescriptions Disp Refills   zolpidem (AMBIEN) 10 MG tablet [Pharmacy Med Name: ZOLPIDEM TARTRATE 10 MG TABLET] 30 tablet     Sig: TAKE 1/2 TO 1 TABLET BY MOUTH AT BEDTIME AS NEEDED FOR SLEEP     There is no refill protocol information for this order

## 2024-01-09 DIAGNOSIS — M545 Low back pain, unspecified: Secondary | ICD-10-CM | POA: Diagnosis not present

## 2024-01-10 DIAGNOSIS — H16232 Neurotrophic keratoconjunctivitis, left eye: Secondary | ICD-10-CM | POA: Diagnosis not present

## 2024-01-10 DIAGNOSIS — M3501 Sicca syndrome with keratoconjunctivitis: Secondary | ICD-10-CM | POA: Diagnosis not present

## 2024-01-10 DIAGNOSIS — H0289 Other specified disorders of eyelid: Secondary | ICD-10-CM | POA: Diagnosis not present

## 2024-01-10 DIAGNOSIS — H26492 Other secondary cataract, left eye: Secondary | ICD-10-CM | POA: Diagnosis not present

## 2024-01-13 DIAGNOSIS — R0982 Postnasal drip: Secondary | ICD-10-CM | POA: Diagnosis not present

## 2024-01-13 DIAGNOSIS — H9202 Otalgia, left ear: Secondary | ICD-10-CM | POA: Diagnosis not present

## 2024-01-13 DIAGNOSIS — J0110 Acute frontal sinusitis, unspecified: Secondary | ICD-10-CM | POA: Diagnosis not present

## 2024-01-16 DIAGNOSIS — M545 Low back pain, unspecified: Secondary | ICD-10-CM | POA: Diagnosis not present

## 2024-01-17 ENCOUNTER — Other Ambulatory Visit: Payer: Self-pay | Admitting: Physician Assistant

## 2024-01-17 DIAGNOSIS — E7800 Pure hypercholesterolemia, unspecified: Secondary | ICD-10-CM

## 2024-01-17 DIAGNOSIS — M19041 Primary osteoarthritis, right hand: Secondary | ICD-10-CM

## 2024-01-17 DIAGNOSIS — M5416 Radiculopathy, lumbar region: Secondary | ICD-10-CM

## 2024-01-17 NOTE — Telephone Encounter (Signed)
 Copied from CRM [id number]. Topic: Clinical - Medication Refill >> Jan 17, 2024  2:19 PM Baldemar Lev wrote: Most Recent Primary Care Visit:  Provider: Jaunita Messier  Department: [facility]  Visit Type: MEDICARE AWV, SEQUENTIAL  Date: 12/20/2023  Medication: rosuvastatin (CRESTOR) 10 MG tablet meloxicam (MOBIC) 15 MG tablet  omeprazole (PRILOSEC) 40 MG capsule  Has the patient contacted their pharmacy? Yes (Agent: If no, request that the patient contact the pharmacy for the refill. If patient does not wish to contact the pharmacy document the reason why and proceed with request.) (Agent: If yes, when and what did the pharmacy advise?)  Is this the correct pharmacy for this prescription? Yes If no, delete pharmacy and type the correct one.  This is the patient's preferred pharmacy:  [facility] [id number] [place], [place] - [address] [place] [address] Phone: [phone] Fax: [phone]  Has the prescription been filled recently? Yes  Is the patient out of the medication? Yes  Has the patient been seen for an appointment in the last year OR does the patient have an upcoming appointment? Yes  Can we respond through MyChart? Yes  Agent: Please be advised that Rx refills may take up to 3 business days. We ask that you follow-up with your pharmacy.

## 2024-01-18 MED ORDER — OMEPRAZOLE 40 MG PO CPDR: 40.0000 mg | DELAYED_RELEASE_CAPSULE | Freq: Every day | ORAL | 3 refills | Status: AC

## 2024-01-18 MED ORDER — ROSUVASTATIN CALCIUM 10 MG PO TABS: ORAL_TABLET | ORAL | 3 refills | Status: AC

## 2024-01-18 NOTE — Telephone Encounter (Signed)
 Requested medication (s) are due for refill today:   Yes for Mobie;  Omeprazole is from 2022;  rosuvastatin not due  Requested medication (s) are on the active medication list:   Yes for all 3  Future visit scheduled:   Yes 10/23 with Janna   Appt 09/25/2023 cancelled by pt.      Last ordered: rosuvastatin 10/05/2022 #90, 3 refills - not due;   Omeprazole 40 mg is from 07/15/2021;   Mobic 15 mg 07/07/1023 #90, 0 refills;  Unable to refill because labs are due.   Cancelled last follow up appt.     Provider to review for refills   Requested Prescriptions  Pending Prescriptions Disp Refills   rosuvastatin (CRESTOR) 10 MG tablet 90 tablet 3    Sig: TAKE 1 TABLET(10 MG) BY MOUTH DAILY     Cardiovascular:  Antilipid - Statins 2 Failed - 01/18/2024  1:31 PM      Failed - Cr in normal range and within 360 days    Creatinine, Ser  Date Value Ref Range Status  07/20/2022 0.85 0.57 - 1.00 mg/dL Final         Failed - Lipid Panel in normal range within the last 12 months    Cholesterol, Total  Date Value Ref Range Status  07/20/2022 153 100 - 199 mg/dL Final   LDL Chol Calc (NIH)  Date Value Ref Range Status  07/20/2022 65 0 - 99 mg/dL Final   HDL  Date Value Ref Range Status  07/20/2022 64 >39 mg/dL Final   Triglycerides  Date Value Ref Range Status  07/20/2022 138 0 - 149 mg/dL Final         Passed - Patient is not pregnant      Passed - Valid encounter within last 12 months    Recent Outpatient Visits   None     Future Appointments             In 6 months Ostwalt, Janna, PA-C [facility]             omeprazole (PRILOSEC) 40 MG capsule 90 capsule 3    Sig: Take 1 capsule (40 mg total) by mouth daily.     Gastroenterology: Proton Pump Inhibitors Passed - 01/18/2024  1:31 PM      Passed - Valid encounter within last 12 months    Recent Outpatient Visits   None     Future Appointments             In 6 months Ostwalt, Janna, PA-C  [facility]             meloxicam (MOBIC) 15 MG tablet 90 tablet 0     Analgesics:  COX2 Inhibitors Failed - 01/18/2024  1:31 PM      Failed - Manual Review: Labs are only required if the patient has taken medication for more than 8 weeks.      Failed - HGB in normal range and within 360 days    Hemoglobin  Date Value Ref Range Status  07/20/2022 14.1 11.1 - 15.9 g/dL Final         Failed - Cr in normal range and within 360 days    Creatinine, Ser  Date Value Ref Range Status  07/20/2022 0.85 0.57 - 1.00 mg/dL Final         Failed - HCT in normal range and within 360 days    Hematocrit  Date  Value Ref Range Status  07/20/2022 41.6 34.0 - 46.6 % Final         Failed - AST in normal range and within 360 days    AST  Date Value Ref Range Status  07/20/2022 20 0 - 40 IU/L Final         Failed - ALT in normal range and within 360 days    ALT  Date Value Ref Range Status  07/20/2022 18 0 - 32 IU/L Final         Failed - eGFR is 30 or above and within 360 days    GFR calc Af Amer  Date Value Ref Range Status  07/16/2020 92 >59 mL/min/1.73 Final    Comment:    **In accordance with recommendations from the NKF-ASN Task force,**   [facility] is in the process of updating its eGFR calculation to the   2021 CKD-EPI creatinine equation that estimates kidney function   without a race variable.    GFR calc non Af Amer  Date Value Ref Range Status  07/16/2020 80 >59 mL/min/1.73 Final   eGFR  Date Value Ref Range Status  07/20/2022 73 >59 mL/min/1.73 Final         Passed - Patient is not pregnant      Passed - Valid encounter within last 12 months    Recent Outpatient Visits   None     Future Appointments             In 6 months Ostwalt, Janna, PA-C [facility]

## 2024-01-23 DIAGNOSIS — M5416 Radiculopathy, lumbar region: Secondary | ICD-10-CM | POA: Diagnosis not present

## 2024-01-24 DIAGNOSIS — M545 Low back pain, unspecified: Secondary | ICD-10-CM | POA: Diagnosis not present

## 2024-01-30 DIAGNOSIS — M545 Low back pain, unspecified: Secondary | ICD-10-CM | POA: Diagnosis not present

## 2024-01-30 MED ORDER — MELOXICAM 15 MG PO TABS: 15.0000 mg | ORAL_TABLET | Freq: Every day | ORAL | 0 refills | Status: DC

## 2024-02-06 DIAGNOSIS — M5416 Radiculopathy, lumbar region: Secondary | ICD-10-CM | POA: Diagnosis not present

## 2024-02-08 ENCOUNTER — Other Ambulatory Visit: Payer: Self-pay | Admitting: Physician Assistant

## 2024-02-08 DIAGNOSIS — G4700 Insomnia, unspecified: Secondary | ICD-10-CM

## 2024-02-09 NOTE — Telephone Encounter (Signed)
 Please, schedule a follow-up for insomnia with me so we could discuss appropriate treatment, including tapering zolpidem . Warm regards, Marquiz Sotelo, [facility], MMS [facility] [phone] (phone) [phone] (fax)

## 2024-02-16 DIAGNOSIS — M5126 Other intervertebral disc displacement, lumbar region: Secondary | ICD-10-CM | POA: Diagnosis not present

## 2024-02-16 DIAGNOSIS — M5416 Radiculopathy, lumbar region: Secondary | ICD-10-CM | POA: Diagnosis not present

## 2024-02-20 DIAGNOSIS — L538 Other specified erythematous conditions: Secondary | ICD-10-CM | POA: Diagnosis not present

## 2024-02-20 DIAGNOSIS — D485 Neoplasm of uncertain behavior of skin: Secondary | ICD-10-CM | POA: Diagnosis not present

## 2024-02-20 DIAGNOSIS — D045 Carcinoma in situ of skin of trunk: Secondary | ICD-10-CM | POA: Diagnosis not present

## 2024-02-20 DIAGNOSIS — D2262 Melanocytic nevi of left upper limb, including shoulder: Secondary | ICD-10-CM | POA: Diagnosis not present

## 2024-02-20 DIAGNOSIS — L821 Other seborrheic keratosis: Secondary | ICD-10-CM | POA: Diagnosis not present

## 2024-02-20 DIAGNOSIS — D225 Melanocytic nevi of trunk: Secondary | ICD-10-CM | POA: Diagnosis not present

## 2024-02-20 DIAGNOSIS — L2989 Other pruritus: Secondary | ICD-10-CM | POA: Diagnosis not present

## 2024-02-20 DIAGNOSIS — L820 Inflamed seborrheic keratosis: Secondary | ICD-10-CM | POA: Diagnosis not present

## 2024-02-20 DIAGNOSIS — D2261 Melanocytic nevi of right upper limb, including shoulder: Secondary | ICD-10-CM | POA: Diagnosis not present

## 2024-02-20 DIAGNOSIS — L570 Actinic keratosis: Secondary | ICD-10-CM | POA: Diagnosis not present

## 2024-02-20 DIAGNOSIS — D2272 Melanocytic nevi of left lower limb, including hip: Secondary | ICD-10-CM | POA: Diagnosis not present

## 2024-02-20 DIAGNOSIS — Z85828 Personal history of other malignant neoplasm of skin: Secondary | ICD-10-CM | POA: Diagnosis not present

## 2024-02-20 DIAGNOSIS — D2271 Melanocytic nevi of right lower limb, including hip: Secondary | ICD-10-CM | POA: Diagnosis not present

## 2024-03-09 ENCOUNTER — Other Ambulatory Visit: Payer: Self-pay | Admitting: Physician Assistant

## 2024-03-09 DIAGNOSIS — G4700 Insomnia, unspecified: Secondary | ICD-10-CM

## 2024-03-11 ENCOUNTER — Other Ambulatory Visit: Payer: Self-pay

## 2024-03-11 DIAGNOSIS — G4700 Insomnia, unspecified: Secondary | ICD-10-CM

## 2024-03-11 DIAGNOSIS — F419 Anxiety disorder, unspecified: Secondary | ICD-10-CM

## 2024-03-11 MED ORDER — SERTRALINE HCL 50 MG PO TABS: 50.0000 mg | ORAL_TABLET | Freq: Every day | ORAL | 0 refills | Status: DC

## 2024-03-11 MED ORDER — ZOLPIDEM TARTRATE 10 MG PO TABS: ORAL_TABLET | ORAL | 0 refills | Status: DC

## 2024-03-11 NOTE — Telephone Encounter (Signed)
 Copied from CRM [id number]. Topic: Clinical - Prescription Issue >> Mar 11, 2024  1:17 PM Chuck Crater wrote: Reason for CRM: Patient request was denied for refill on Ambien . She has scheduled an appointment for medication refill but next Office visit with pcp is 06/27 and she only has 3 pills left. Patient wants to know if a few pills of Ambien  can be called in until her appointment.

## 2024-03-11 NOTE — Telephone Encounter (Signed)
 X rays

## 2024-03-14 DIAGNOSIS — M48062 Spinal stenosis, lumbar region with neurogenic claudication: Secondary | ICD-10-CM | POA: Diagnosis not present

## 2024-03-14 DIAGNOSIS — D045 Carcinoma in situ of skin of trunk: Secondary | ICD-10-CM | POA: Diagnosis not present

## 2024-03-14 DIAGNOSIS — L538 Other specified erythematous conditions: Secondary | ICD-10-CM | POA: Diagnosis not present

## 2024-03-14 DIAGNOSIS — M5416 Radiculopathy, lumbar region: Secondary | ICD-10-CM | POA: Diagnosis not present

## 2024-03-14 DIAGNOSIS — L2989 Other pruritus: Secondary | ICD-10-CM | POA: Diagnosis not present

## 2024-03-14 DIAGNOSIS — M7918 Myalgia, other site: Secondary | ICD-10-CM | POA: Diagnosis not present

## 2024-03-14 DIAGNOSIS — L820 Inflamed seborrheic keratosis: Secondary | ICD-10-CM | POA: Diagnosis not present

## 2024-03-29 ENCOUNTER — Ambulatory Visit (INDEPENDENT_AMBULATORY_CARE_PROVIDER_SITE_OTHER): Admitting: Physician Assistant

## 2024-03-29 ENCOUNTER — Encounter: Payer: Self-pay | Admitting: Physician Assistant

## 2024-03-29 VITALS — BP 126/78 | HR 65 | Resp 16 | Ht 61.0 in | Wt 110.0 lb

## 2024-03-29 DIAGNOSIS — M5481 Occipital neuralgia: Secondary | ICD-10-CM

## 2024-03-29 DIAGNOSIS — F339 Major depressive disorder, recurrent, unspecified: Secondary | ICD-10-CM | POA: Diagnosis not present

## 2024-03-29 DIAGNOSIS — M5416 Radiculopathy, lumbar region: Secondary | ICD-10-CM

## 2024-03-29 DIAGNOSIS — E782 Mixed hyperlipidemia: Secondary | ICD-10-CM

## 2024-03-29 DIAGNOSIS — G43809 Other migraine, not intractable, without status migrainosus: Secondary | ICD-10-CM | POA: Diagnosis not present

## 2024-03-29 DIAGNOSIS — J4520 Mild intermittent asthma, uncomplicated: Secondary | ICD-10-CM

## 2024-03-29 DIAGNOSIS — G4700 Insomnia, unspecified: Secondary | ICD-10-CM

## 2024-03-29 DIAGNOSIS — K219 Gastro-esophageal reflux disease without esophagitis: Secondary | ICD-10-CM

## 2024-03-29 DIAGNOSIS — F419 Anxiety disorder, unspecified: Secondary | ICD-10-CM

## 2024-03-29 MED ORDER — SERTRALINE HCL 25 MG PO TABS: 25.0000 mg | ORAL_TABLET | Freq: Every day | ORAL | 3 refills | Status: DC

## 2024-03-29 MED ORDER — ZOLPIDEM TARTRATE 10 MG PO TABS
ORAL_TABLET | ORAL | 0 refills | Status: DC
Start: 2024-03-29 — End: 2024-04-29

## 2024-03-29 MED ORDER — DULOXETINE HCL 20 MG PO CPEP: 20.0000 mg | ORAL_CAPSULE | Freq: Every day | ORAL | 3 refills | Status: DC

## 2024-03-29 NOTE — Progress Notes (Unsigned)
 Established patient visit  Patient: Brooke Mcdowell   DOB: [birth year]   73 y.o. Female  MRN: [id number] Visit Date: 03/29/2024  Today's healthcare provider: Jolynn Spencer, PA-C   Chief Complaint  Patient presents with   Medication Refill    Med Refill. Medication question?    Subjective     HPI     Medication Refill    Additional comments: Med Refill. Medication question?       Last edited by Marylen Odella LITTIE, CMA on 03/29/2024  9:39 AM.       Discussed the use of AI scribe software for clinical note transcription with the patient, who gave verbal consent to proceed.  History of Present Illness Brooke Mcdowell is a 73 year old female with occipital neuralgia who presents for medication management and sleep issues.  She experiences head pain behind her eyes, identified as nerve pain. Her neurologist recommended switching from Zoloft  to Cymbalta for better nerve pain management. She currently takes Zoloft  50 mg daily but experienced agitation when attempting to discontinue it previously. She also takes Crestor  for high cholesterol, omeprazole  as needed for indigestion, and rizatriptan  for migraines. Insurance issues limit her access to other migraine medications.  She experiences daily headaches and uses Ambien  to initiate sleep, as she has difficulty maintaining sleep without it. She has back pain due to herniated discs and nerve involvement, using tramadol sparingly due to constipation. In December, she had a significant illness with a sore throat and enlarged tonsils, leading to a 9-pound weight loss over three months. She manages allergies with Nasacort  and saline rinses, maintaining healthy eating and hydration.       03/29/2024    9:44 AM 12/20/2023    2:14 PM 09/06/2023   10:49 AM  Depression screen PHQ 2/9  Decreased Interest 0 0 0  Down, Depressed, Hopeless 0 0 0  PHQ - 2 Score 0 0 0  Altered sleeping 0 1   Tired, decreased energy 0 1   Change in appetite 0 0    Feeling bad or failure about yourself  0 0   Trouble concentrating 0 0   Moving slowly or fidgety/restless 0 0   Suicidal thoughts 0 0   PHQ-9 Score 0 2   Difficult doing work/chores Not difficult at all Not difficult at all       03/29/2024    9:45 AM 07/25/2023    2:19 PM  GAD 7 : Generalized Anxiety Score  Nervous, Anxious, on Edge 0 0  Control/stop worrying 0 0  Worry too much - different things 0   Trouble relaxing 2   Restless 0   Easily annoyed or irritable 0   Afraid - awful might happen 0   Total GAD 7 Score 2   Anxiety Difficulty Not difficult at all Not difficult at all    Medications: Outpatient Medications Prior to Visit  Medication Sig   albuterol  (PROVENTIL ) (2.5 MG/3ML) 0.083% nebulizer solution Inhale into the lungs.   amoxicillin -clavulanate (AUGMENTIN ) 875-125 MG tablet Take 1 tablet by mouth 2 (two) times daily.   chlorpheniramine-HYDROcodone  (TUSSIONEX) 10-8 MG/5ML Take 5 mLs by mouth at bedtime as needed for cough.   EQ GENTLE LUBRICANT 0.3 % SOLN Place 1 drop into both eyes at bedtime as needed (dry/irritated eyes.).   estradiol  (ESTRACE ) 1 MG tablet TAKE 1 TABLET(1 MG) BY MOUTH DAILY   fluticasone  (FLONASE ) 50 MCG/ACT nasal spray Place 2 sprays into both nostrils daily.   Fluticasone -Umeclidin-Vilant (TRELEGY ELLIPTA ) 100-62.5-25  MCG/ACT AEPB Inhale 1 puff into the lungs daily.   HYDROMET 5-1.5 MG/5ML syrup Take 5 mLs by mouth at bedtime as needed.   Magnesium 250 MG TABS Take 500 mg by mouth every evening.    medroxyPROGESTERone  (PROVERA ) 2.5 MG tablet TAKE 1 TABLET(2.5 MG) BY MOUTH DAILY   meloxicam  (MOBIC ) 15 MG tablet Take 1 tablet (15 mg total) by mouth daily. Take with meals.   methocarbamol (ROBAXIN) 500 MG tablet Take 500 mg by mouth 3 (three) times daily.   Multiple Vitamins-Minerals (ADULT GUMMY PO) Take 2 tablets by mouth every evening. Vitafusion MultiVites Gummy   naratriptan (AMERGE) 2.5 MG tablet Take by mouth.   NURTEC 75 MG TBDP Take  by mouth daily.   omeprazole  (PRILOSEC) 40 MG capsule Take 1 capsule (40 mg total) by mouth daily.   rizatriptan  (MAXALT ) 10 MG tablet Take by mouth.   rosuvastatin  (CRESTOR ) 10 MG tablet TAKE 1 TABLET(10 MG) BY MOUTH DAILY   sertraline  (ZOLOFT ) 50 MG tablet Take 1 tablet (50 mg total) by mouth at bedtime.   [DISCONTINUED] zolpidem  (AMBIEN ) 10 MG tablet Take 1 tablet once daily   predniSONE  (STERAPRED UNI-PAK 21 TAB) 10 MG (21) TBPK tablet Take as directed on package.  (60 mg po on day 1, 50 mg po on day 2...) (Patient not taking: Reported on 03/29/2024)   No facility-administered medications prior to visit.    Review of Systems All negative Except see HPI   {Insert previous labs (optional):23779} {See past labs  Heme  Chem  Endocrine  Serology  Results Review (optional):1}   Objective    BP 126/78 (BP Location: Right Arm, Patient Position: Sitting, Cuff Size: Small)   Pulse 65   Resp 16   Ht 5' 1 (1.549 m)   Wt 110 lb (49.9 kg)   SpO2 99%   BMI 20.78 kg/m  {Insert last BP/Wt (optional):23777}{See vitals history (optional):1}   Physical Exam Vitals reviewed.  Constitutional:      General: She is not in acute distress.    Appearance: Normal appearance. She is well-developed. She is not diaphoretic.  HENT:     Head: Normocephalic and atraumatic.   Eyes:     General: No scleral icterus.    Conjunctiva/sclera: Conjunctivae normal.   Neck:     Thyroid : No thyromegaly.   Cardiovascular:     Rate and Rhythm: Normal rate and regular rhythm.     Pulses: Normal pulses.     Heart sounds: Normal heart sounds. No murmur heard. Pulmonary:     Effort: Pulmonary effort is normal. No respiratory distress.     Breath sounds: Normal breath sounds. No wheezing, rhonchi or rales.   Musculoskeletal:     Cervical back: Neck supple.     Right lower leg: No edema.     Left lower leg: No edema.  Lymphadenopathy:     Cervical: No cervical adenopathy.   Skin:    General: Skin is  warm and dry.     Findings: No rash.   Neurological:     Mental Status: She is alert and oriented to person, place, and time. Mental status is at baseline.   Psychiatric:        Mood and Affect: Mood normal.        Behavior: Behavior normal.      No results found for any visits on 03/29/24.      Assessment & Plan Occipital Neuralgia Chronic nerve pain causing daily headaches. Transition from Zoloft  to  Cymbalta recommended for improved pain management. Cymbalta expected to address both nerve pain and depression. Requires careful monitoring due to potential side effects and gradual tapering of Zoloft . - Prescribe Cymbalta starting at 20 mg, increasing to 60 mg as tolerated. - Taper Zoloft  by reducing the dose by 25% every two weeks, starting from 37.5 mg, then to 25 mg, and finally to 12.5 mg. - Monitor for adverse reactions during the transition. - Schedule follow-up in less than four weeks to assess the transition.  Depression Managed with Zoloft . Previous attempt to wean off resulted in agitation, indicating need for continued medication. Transition to Cymbalta aims to manage depression and nerve pain, with potential sleep improvement. - Taper Zoloft  as outlined in the plan for Occipital Neuralgia. - Monitor mood and anxiety levels during the transition to Cymbalta.  Migraine Chronic migraines managed with ~triptan. Experiences daily headaches and uses ~triptan as needed. Insurance limitations prevent use of other migraine medications. - Continue ~triptan as needed for migraines.  Chronic Back Pain Due to herniated discs and nerve compression. Scheduled for an epidural. Uses tramadol sparingly due to constipation and potential interactions with Cymbalta. Advised to avoid combining tramadol with Cymbalta due to risk of adverse effects. - Proceed with scheduled epidural. - Use tramadol sparingly and avoid combining with Cymbalta.  Insomnia Chronic difficulty maintaining sleep,  managed with Ambien . Reports inability to sleep without Ambien . Plan to address insomnia after Cymbalta stabilization, which may improve sleep quality. - Continue Ambien  as currently prescribed. - Reassess need/taper for Ambien  after Cymbalta stabilization.  Hyperlipidemia Chronic Managed with Crestor . Continues Crestor  10 mg daily due to previously high cholesterol levels. - Continue Crestor  10 mg once daily.  Gastroesophageal Reflux Disease (GERD) Chronic Intermittent indigestion managed with omeprazole  as needed. - Continue omeprazole  as needed for indigestion.  Hormone Replacement Therapy On hormone replacement therapy with Provera  and estrogen, taken regularly. - Continue Provera  and estrogen as currently prescribed.  General Health Maintenance Experienced significant weight loss due to illness in December, January, and February. Current health maintenance includes managing allergies and ensuring adequate hydration and nutrition. - Encourage warm salt gargles and nasal saline rinses for throat discomfort. - Use Nasacort  for allergies as needed. - Encourage healthy eating and hydration.  Follow-up Necessary to monitor the transition from Zoloft  to Cymbalta and assess management of other chronic conditions. - Schedule follow-up appointment in less than four weeks to assess medication transition and overall health.    No orders of the defined types were placed in this encounter.   Return in about 4 weeks (around 04/26/2024) for chronic disease f/u .   The patient was advised to call back or seek an in-person evaluation if the symptoms worsen or if the condition fails to improve as anticipated.  I discussed the assessment and treatment plan with the patient. The patient was provided an opportunity to ask questions and all were answered. The patient agreed with the plan and demonstrated an understanding of the instructions.  I, Gerlad Pelzel, PA-C have reviewed all documentation  for this visit. The documentation on 03/29/2024  for the exam, diagnosis, procedures, and orders are all accurate and complete.  Jolynn Spencer, [facility], MMS [facility] [phone] (phone) [phone] (fax)  [facility]

## 2024-04-03 ENCOUNTER — Other Ambulatory Visit: Payer: Self-pay | Admitting: Physician Assistant

## 2024-04-03 DIAGNOSIS — F419 Anxiety disorder, unspecified: Secondary | ICD-10-CM

## 2024-04-08 DIAGNOSIS — M5416 Radiculopathy, lumbar region: Secondary | ICD-10-CM | POA: Diagnosis not present

## 2024-04-08 DIAGNOSIS — M48062 Spinal stenosis, lumbar region with neurogenic claudication: Secondary | ICD-10-CM | POA: Diagnosis not present

## 2024-04-26 ENCOUNTER — Other Ambulatory Visit: Payer: Self-pay | Admitting: Physician Assistant

## 2024-04-26 DIAGNOSIS — G4700 Insomnia, unspecified: Secondary | ICD-10-CM

## 2024-04-28 NOTE — Progress Notes (Unsigned)
 Established patient visit  Patient: Brooke Mcdowell   DOB: [birth year]   74 y.o. Female  MRN: [id number] Visit Date: 04/29/2024  Today's healthcare provider: Jolynn Spencer, PA-C   No chief complaint on file.  Subjective       Discussed the use of AI scribe software for clinical note transcription with the patient, who gave verbal consent to proceed.  History of Present Illness Brooke Mcdowell is a 73 year old female with chronic pain and anxiety who presents for medication management.  She experiences chronic pain, particularly behind her eye and in her neck, with significant improvement since starting Cymbalta  20 mg daily. She has not used meloxicam  or migraine medication recently due to Cymbalta 's effectiveness in managing nerve pain.  She takes 25 mg of Zoloft  daily. A recent attempt to decrease the dosage resulted in increased anxiety during the first week. She expresses confusion about medication instructions from the pharmacy.  She has insomnia with poor sleep quality, frequently waking at night. She occasionally takes an additional dose of Ambien  to aid sleep. Her watch indicates very irregular sleep patterns.       03/29/2024    9:44 AM 12/20/2023    2:14 PM 09/06/2023   10:49 AM  Depression screen PHQ 2/9  Decreased Interest 0 0 0  Down, Depressed, Hopeless 0 0 0  PHQ - 2 Score 0 0 0  Altered sleeping 0 1   Tired, decreased energy 0 1   Change in appetite 0 0   Feeling bad or failure about yourself  0 0   Trouble concentrating 0 0   Moving slowly or fidgety/restless 0 0   Suicidal thoughts 0 0   PHQ-9 Score 0 2   Difficult doing work/chores Not difficult at all Not difficult at all       03/29/2024    9:45 AM 07/25/2023    2:19 PM  GAD 7 : Generalized Anxiety Score  Nervous, Anxious, on Edge 0 0  Control/stop worrying 0 0  Worry too much - different things 0   Trouble relaxing 2   Restless 0   Easily annoyed or irritable 0   Afraid - awful might happen 0    Total GAD 7 Score 2   Anxiety Difficulty Not difficult at all Not difficult at all    Medications: Outpatient Medications Prior to Visit  Medication Sig   albuterol  (PROVENTIL ) (2.5 MG/3ML) 0.083% nebulizer solution Inhale into the lungs.   amoxicillin -clavulanate (AUGMENTIN ) 875-125 MG tablet Take 1 tablet by mouth 2 (two) times daily.   chlorpheniramine-HYDROcodone  (TUSSIONEX) 10-8 MG/5ML Take 5 mLs by mouth at bedtime as needed for cough.   DULoxetine  (CYMBALTA ) 20 MG capsule Take 1 capsule (20 mg total) by mouth daily.   EQ GENTLE LUBRICANT 0.3 % SOLN Place 1 drop into both eyes at bedtime as needed (dry/irritated eyes.).   estradiol  (ESTRACE ) 1 MG tablet TAKE 1 TABLET(1 MG) BY MOUTH DAILY   fluticasone  (FLONASE ) 50 MCG/ACT nasal spray Place 2 sprays into both nostrils daily.   Fluticasone -Umeclidin-Vilant (TRELEGY ELLIPTA ) 100-62.5-25 MCG/ACT AEPB Inhale 1 puff into the lungs daily.   HYDROMET 5-1.5 MG/5ML syrup Take 5 mLs by mouth at bedtime as needed.   Magnesium 250 MG TABS Take 500 mg by mouth every evening.    medroxyPROGESTERone  (PROVERA ) 2.5 MG tablet TAKE 1 TABLET(2.5 MG) BY MOUTH DAILY   meloxicam  (MOBIC ) 15 MG tablet Take 1 tablet (15 mg total) by mouth daily. Take with meals.   methocarbamol (  ROBAXIN) 500 MG tablet Take 500 mg by mouth 3 (three) times daily.   Multiple Vitamins-Minerals (ADULT GUMMY PO) Take 2 tablets by mouth every evening. Vitafusion MultiVites Gummy   naratriptan (AMERGE) 2.5 MG tablet Take by mouth.   NURTEC 75 MG TBDP Take by mouth daily.   omeprazole  (PRILOSEC) 40 MG capsule Take 1 capsule (40 mg total) by mouth daily.   predniSONE  (STERAPRED UNI-PAK 21 TAB) 10 MG (21) TBPK tablet Take as directed on package.  (60 mg po on day 1, 50 mg po on day 2...) (Patient not taking: Reported on 03/29/2024)   rizatriptan  (MAXALT ) 10 MG tablet Take by mouth.   rosuvastatin  (CRESTOR ) 10 MG tablet TAKE 1 TABLET(10 MG) BY MOUTH DAILY   sertraline  (ZOLOFT ) 25 MG  tablet Take 1 tablet (25 mg total) by mouth daily.   sertraline  (ZOLOFT ) 50 MG tablet Take 1 tablet (50 mg total) by mouth at bedtime.   zolpidem  (AMBIEN ) 10 MG tablet Take 1 tablet once daily   No facility-administered medications prior to visit.    Review of Systems  All other systems reviewed and are negative.  All negative Except see HPI       Objective    There were no vitals taken for this visit.    Physical Exam Vitals reviewed.  Constitutional:      General: She is not in acute distress.    Appearance: Normal appearance. She is well-developed. She is not diaphoretic.  HENT:     Head: Normocephalic and atraumatic.  Eyes:     General: No scleral icterus.    Conjunctiva/sclera: Conjunctivae normal.  Neck:     Thyroid : No thyromegaly.  Cardiovascular:     Rate and Rhythm: Normal rate and regular rhythm.     Pulses: Normal pulses.     Heart sounds: Normal heart sounds. No murmur heard. Pulmonary:     Effort: Pulmonary effort is normal. No respiratory distress.     Breath sounds: Normal breath sounds. No wheezing, rhonchi or rales.  Musculoskeletal:     Cervical back: Neck supple.     Right lower leg: No edema.     Left lower leg: No edema.  Lymphadenopathy:     Cervical: No cervical adenopathy.  Skin:    General: Skin is warm and dry.     Findings: No rash.  Neurological:     Mental Status: She is alert and oriented to person, place, and time. Mental status is at baseline.  Psychiatric:        Mood and Affect: Mood normal.        Behavior: Behavior normal.      No results found for any visits on 04/29/24.      Assessment & Plan Chronic Pain/Occipital neuralgia/Migraine Cymbalta  significantly reduced pain, decreasing reliance on meloxicam  and migraine medication - Continue Cymbalta  20 mg daily. - Monitor pain levels and adjust Cymbalta  dosage if necessary. Will follow-up  Depression/Anxiety Chronic Plan to taper off sertraline  and potentially  increase Cymbalta  for better management of depression and chronic pain. Patient experienced anxiety during the first week of sertraline  reduction. - Refer to pharmacist for medication management and adjustment. - Gradually decrease sertraline  and potentially increase Cymbalta . - Maintain current medication regimen until pharmacist consultation. Pt seems a little bit confused with instructions. Advised to proceed with our clinic pharmacist  Insomnia Chronic Poor sleep quality with frequent waking, possibly influenced by medication changes. Discussed therapy sessions for sleep improvement during medication transition. - Continue current  Ambien  regimen. - Consider referral to psychologist for therapy to aid in sleep improvement. - Monitor sleep patterns and adjust treatment as needed.  General Health Maintenance Received 'pneumonia shot' on April 27, 2024, for illness prevention.  Follow-up Follow-up needed to monitor medication adjustments and overall health. - Schedule follow-up appointment in one month. - Ensure communication with pharmacist for medication management. - Remind to notify provider of any medication refills needed in advance.    No orders of the defined types were placed in this encounter.   No follow-ups on file.   The patient was advised to call back or seek an in-person evaluation if the symptoms worsen or if the condition fails to improve as anticipated.  I discussed the assessment and treatment plan with the patient. The patient was provided an opportunity to ask questions and all were answered. The patient agreed with the plan and demonstrated an understanding of the instructions.  I, Sylver Vantassell, PA-C have reviewed all documentation for this visit. The documentation on 04/29/2024  for the exam, diagnosis, procedures, and orders are all accurate and complete.  Jolynn Spencer, [facility], MMS [facility] [phone] (phone) [phone] (fax)  [facility]

## 2024-04-29 ENCOUNTER — Ambulatory Visit (INDEPENDENT_AMBULATORY_CARE_PROVIDER_SITE_OTHER): Admitting: Physician Assistant

## 2024-04-29 ENCOUNTER — Encounter: Payer: Self-pay | Admitting: Physician Assistant

## 2024-04-29 ENCOUNTER — Telehealth: Payer: Self-pay | Admitting: Physician Assistant

## 2024-04-29 VITALS — BP 111/74 | HR 83 | Resp 14 | Ht 61.0 in | Wt 106.4 lb

## 2024-04-29 DIAGNOSIS — J4531 Mild persistent asthma with (acute) exacerbation: Secondary | ICD-10-CM

## 2024-04-29 DIAGNOSIS — F419 Anxiety disorder, unspecified: Secondary | ICD-10-CM

## 2024-04-29 DIAGNOSIS — M5481 Occipital neuralgia: Secondary | ICD-10-CM | POA: Insufficient documentation

## 2024-04-29 DIAGNOSIS — F339 Major depressive disorder, recurrent, unspecified: Secondary | ICD-10-CM | POA: Insufficient documentation

## 2024-04-29 DIAGNOSIS — E782 Mixed hyperlipidemia: Secondary | ICD-10-CM

## 2024-04-29 DIAGNOSIS — G4700 Insomnia, unspecified: Secondary | ICD-10-CM | POA: Insufficient documentation

## 2024-04-29 DIAGNOSIS — G43809 Other migraine, not intractable, without status migrainosus: Secondary | ICD-10-CM | POA: Diagnosis not present

## 2024-04-29 DIAGNOSIS — G43909 Migraine, unspecified, not intractable, without status migrainosus: Secondary | ICD-10-CM | POA: Insufficient documentation

## 2024-04-29 DIAGNOSIS — M5416 Radiculopathy, lumbar region: Secondary | ICD-10-CM

## 2024-04-29 DIAGNOSIS — K219 Gastro-esophageal reflux disease without esophagitis: Secondary | ICD-10-CM

## 2024-04-29 MED ORDER — ZOLPIDEM TARTRATE 10 MG PO TABS: ORAL_TABLET | ORAL | 0 refills | Status: DC

## 2024-04-29 NOTE — Telephone Encounter (Signed)
 Please, check with pt if she would like to see collaborative care services/counseling for insomnia in our clinic. This is a new service for our patients in the clinic.I will be starting in August?

## 2024-04-29 NOTE — Telephone Encounter (Signed)
 Order has been placed. I spoke with pt at 1:32pm and made aware. Pt agreed to be referred.

## 2024-05-01 ENCOUNTER — Telehealth: Payer: Self-pay

## 2024-05-01 NOTE — Progress Notes (Signed)
 Complex Care Management Note  Care Guide Note 05/01/2024 Name: DAWNETTE MIONE MRN: [id number] DOB: 01-20-1951  MARCEDES TECH is a 73 y.o. year old female who sees Weston, Janna, PA-C for primary care. I reached out to Dorothe LITTIE Moats by phone today to offer complex care management services.  Ms. Rackham was given information about Complex Care Management services today including:   The Complex Care Management services include support from the care team which includes your Nurse Care Manager, Clinical Social Worker, or Pharmacist.  The Complex Care Management team is here to help remove barriers to the health concerns and goals most important to you. Complex Care Management services are voluntary, and the patient may decline or stop services at any time by request to their care team member.   Complex Care Management Consent Status: Patient agreed to services and verbal consent obtained.   Follow up plan:  Telephone appointment with complex care management team member scheduled for:  05/07/24 at 11:00 a.m.   Encounter Outcome:  Patient Scheduled  Dreama [facility]  [facility], [facility] Health Care Management Assistant Direct Dial: [phone]  Fax: [phone]

## 2024-05-02 DIAGNOSIS — M47816 Spondylosis without myelopathy or radiculopathy, lumbar region: Secondary | ICD-10-CM | POA: Diagnosis not present

## 2024-05-02 DIAGNOSIS — M48062 Spinal stenosis, lumbar region with neurogenic claudication: Secondary | ICD-10-CM | POA: Diagnosis not present

## 2024-05-02 DIAGNOSIS — M5416 Radiculopathy, lumbar region: Secondary | ICD-10-CM | POA: Diagnosis not present

## 2024-05-02 DIAGNOSIS — M62830 Muscle spasm of back: Secondary | ICD-10-CM | POA: Diagnosis not present

## 2024-05-02 DIAGNOSIS — M5380 Other specified dorsopathies, site unspecified: Secondary | ICD-10-CM | POA: Diagnosis not present

## 2024-05-07 ENCOUNTER — Other Ambulatory Visit: Payer: Self-pay

## 2024-05-07 NOTE — Progress Notes (Signed)
 05/07/2024 Name: Brooke Mcdowell MRN: [id number] DOB: Jun 07, 1951  No chief complaint on file.   Brooke Mcdowell is a 73 y.o. year old female who presented for a telephone visit.   They were referred to the pharmacist by their PCP for assistance in managing complex medication management. Referral specifically noted  patient wants to decrease zoloft  and increase cymbalta  and possible decrease a dose of Ambien .   Subjective: Brooke Mcdowell is a 73 year old female with a PMH significant for insomnia, occipital neuralgia, anxiety, and depression. She believes that she does not have depression.  Care Team: Primary Care Provider: Ostwalt, Janna, PA-C ; Next Scheduled Visit: 05/30/2024   Medication Access/Adherence (noted during medication review) According to [facility], the last recorded prescription fill for rosuvastatin  was on 10/06/2023 for a 90-day supply. However, the patient reports taking the medication daily without missing any doses. She states that she fills her prescriptions at [facility]--her insurance-preferred pharmacy--whose refill history is not accessible via [facility].  Per the patient, she most recently filled rosuvastatin  on 01/18/2024 and again on 04/17/2024, both as 90-day supplies. She denies having a surplus or stockpile of the medication.  The importance of medication adherence and compliance was reviewed and reinforced during today's visit.   Medication Management:  - Patient states that she was not aware of the dose taper of sertraline  by reducing by 25% every two weeks such that starting from 50 mg then to 37.5 mg, then to 25 mg, and finally to 12.5 mg. She followed the directions on the bottle that indicated 25 mg daily that is what she has been taking since her PCP changed the dose. Patient believes that the reason for the increased anxiety was due to the 50% dose reduction.   Patient reports that Cymbalta  appears to be helping with her eye pain, but her back pain  has not improved. She expressed a desire to be on the appropriate dose of Cymbalta  but does not want to be on a high dose. She is scheduled to undergo a procedure soon for back pain. Per records, Cymbalta  20 mg was last filled on 03/29/2024 for a 90-day supply.  Regarding sleep, the patient reports taking  tablet of zolpidem  most days and a full tablet a couple of times per week. She is reluctant to discontinue the medication, despite being aware of the increased fall risk.  The patient experienced a headache a few days ago and checked her blood glucose, which was 134 mg/dL approximately three hours after eating. She was unsure of the cause of the headache and asked whether that blood glucose level was appropriate. Discussed appropriate blood glucose levels.   Patient states her current weight is 104 lbs and reports additional weight loss over the past week.   Objective:  No results found for: HGBA1C  Lab Results  Component Value Date   CREATININE 0.85 07/20/2022   BUN 14 07/20/2022   NA 143 07/20/2022   K 3.4 (L) 07/20/2022   CL 105 07/20/2022   CO2 24 07/20/2022    Lab Results  Component Value Date   CHOL 153 07/20/2022   HDL 64 07/20/2022   LDLCALC 65 07/20/2022   TRIG 138 07/20/2022   CHOLHDL 2.4 07/20/2022    Medications Reviewed Today     Reviewed by Cleatus Dorcas SAUNDERS, RPH (Pharmacist) on 05/07/24 at 1140  Med List Status: <None>   Medication Order Taking? Sig Documenting Provider Last Dose Status Informant  albuterol  (PROVENTIL ) (2.5 MG/3ML) 0.083%  nebulizer solution [id number] Yes Inhale into the lungs. [provider]  Active   diazepam (VALIUM) 5 MG tablet [id number] Yes Take 5 mg by mouth 2 (two) times daily as needed. [provider]  Active   DULoxetine  (CYMBALTA ) 20 MG capsule [id number] Yes Take 1 capsule (20 mg total) by mouth daily. Ostwalt, Janna, PA-C  Active   EQ GENTLE LUBRICANT 0.3 % SOLN [id number] Yes Place 1 drop into both eyes at  bedtime as needed (dry/irritated eyes.). [provider]  Active Self  estradiol  (ESTRACE ) 1 MG tablet [id number] Yes TAKE 1 TABLET(1 MG) BY MOUTH DAILY Emilio Marseille T, FNP  Active   fluticasone  (FLONASE ) 50 MCG/ACT nasal spray [id number] Yes Place 2 sprays into both nostrils daily. Emilio Marseille DASEN, FNP  Active            Med Note CATHY, PAULA   Wed Dec 20, 2023  2:06 PM) prn  Fluticasone -Umeclidin-Vilant (TRELEGY ELLIPTA ) 100-62.5-25 MCG/ACT AEPB [id number]  Inhale 1 puff into the lungs daily.  Patient not taking: Reported on 05/07/2024   Gareth Mliss FALCON, FNP  Active   Magnesium 250 MG TABS [id number] Yes Take 500 mg by mouth every evening.  [provider]  Active Self  medroxyPROGESTERone  (PROVERA ) 2.5 MG tablet [id number] Yes TAKE 1 TABLET(2.5 MG) BY MOUTH DAILY Emilio Marseille DASEN, FNP  Active   Multiple Vitamins-Minerals (ADULT GUMMY PO) [id number] Yes Take 2 tablets by mouth every evening. Vitafusion MultiVites Gummy [provider]  Active Self  NURTEC 75 MG TBDP [id number]  Take by mouth daily.  Patient not taking: Reported on 05/07/2024   [provider]  Active   omeprazole  (PRILOSEC) 40 MG capsule [id number] Yes Take 1 capsule (40 mg total) by mouth daily. Ostwalt, Janna, PA-C  Active   rosuvastatin  (CRESTOR ) 10 MG tablet [id number] Yes TAKE 1 TABLET(10 MG) BY MOUTH DAILY Ostwalt, Janna, PA-C  Active   sertraline  (ZOLOFT ) 25 MG tablet [id number] Yes Take 1 tablet (25 mg total) by mouth daily. Ostwalt, Janna, PA-C  Active   zolpidem  (AMBIEN ) 10 MG tablet [id number] Yes Take 1 tablet once daily Ostwalt, Janna, PA-C  Active               Assessment/Plan:   Patient does not have recent sodium levels prior to or following the initiation of Cymbalta , which was started in late June 2025. Given the patient is over 58 years old and has low body weight, it may be appropriate to assess sodium levels due to the risk of hyponatremia associated with duloxetine  in older  adults.  Plan:  Will defer Cymbalta  dose adjustment until sodium and renal function (Na, SCr) can be evaluated to ensure safety. Future consideration to reduce sertraline  from 25 mg to 12.5 mg daily for two weeks then stop while increasing Cymbalta  to 40 mg daily until seen by PCP to possibly increase to optimal dose.   Ambien  5 mg is currently taken frequently instead of 10 mg daily. As this is a controlled substance, any adjustments will need to be made by the primary care provider (PCP). Encouraged patient to try to consistently take Ambien  5 mg daily since she has already been taking this dose and better sleep hygiene.      Follow Up Plan: 2 weeks   Dorcas Solian, PharmD Clinical Pharmacist Cell: [phone]

## 2024-05-13 ENCOUNTER — Telehealth: Payer: Self-pay

## 2024-05-13 ENCOUNTER — Institutional Professional Consult (permissible substitution): Admitting: Licensed Clinical Social Worker

## 2024-05-13 NOTE — Telephone Encounter (Signed)
 Copied from CRM #[id number]. Topic: Clinical - Request for Lab/Test Order >> May 13, 2024  1:21 PM Everette C wrote: Reason for CRM: The patient has called to request labs, at the direction of their pharmacist, for a sodium check   To potentially increase their DULoxetine  (CYMBALTA ) 20 MG capsule [[id number]] and begin cessation of their sertraline  (ZOLOFT ) 25 MG tablet [[id number]]   Please contact the patient further when possible

## 2024-05-13 NOTE — Progress Notes (Incomplete)
 05/07/2024 Name: Brooke Mcdowell MRN: [id number] DOB: 1951/08/07  No chief complaint on file.   Brooke Mcdowell is a 73 y.o. year old female who presented for a telephone visit.   They were referred to the pharmacist by their PCP for assistance in managing complex medication management. Referral specifically noted  patient wants to decrease zoloft  and increase cymbalta  and possible decrease a dose of Ambien .   Subjective: Brooke Mcdowell is a 73 year old female with a PMH significant for insomnia, occipital neuralgia, anxiety, and depression. She believes that she does not have depression.  Care Team: Primary Care Provider: Ostwalt, Janna, PA-C ; Next Scheduled Visit: 05/30/2024   Medication Access/Adherence (noted during medication review) According to [facility], the last recorded prescription fill for rosuvastatin  was on 10/06/2023 for a 90-day supply. However, the patient reports taking the medication daily without missing any doses. She states that she fills her prescriptions at [facility]--her insurance-preferred pharmacy--whose refill history is not accessible via [facility].  Per the patient, she most recently filled rosuvastatin  on 01/18/2024 and again on 04/17/2024, both as 90-day supplies. She denies having a surplus or stockpile of the medication.  The importance of medication adherence and compliance was reviewed and reinforced during today's visit.   Medication Management:  - Patient states that she was not aware of the dose taper of sertraline  by reducing by 25% every two weeks such that starting from 50 mg then to 37.5 mg, then to 25 mg, and finally to 12.5 mg. She followed the directions on the bottle that indicated 25 mg daily that is what she has been taking since her PCP changed the dose. Patient believes that the reason for the increased anxiety was due to the 50% dose reduction.   Patient reports that Cymbalta  appears to be helping with her eye pain, but her back pain  has not improved. She expressed a desire to be on the appropriate dose of Cymbalta  but does not want to be on a high dose. She is scheduled to undergo a procedure soon for back pain. Per records, Cymbalta  20 mg was last filled on 03/29/2024 for a 90-day supply.  Regarding sleep, the patient reports taking  tablet of zolpidem  most days and a full tablet a couple of times per week. She is reluctant to discontinue the medication, despite being aware of the increased fall risk.  The patient experienced a headache a few days ago and checked her blood glucose, which was 134 mg/dL approximately three hours after eating. She was unsure of the cause of the headache and asked whether that blood glucose level was appropriate. Discussed appropriate blood glucose levels.   Patient states her current weight is 104 lbs and reports additional weight loss over the past week.   Objective:  No results found for: HGBA1C  Lab Results  Component Value Date   CREATININE 0.85 07/20/2022   BUN 14 07/20/2022   NA 143 07/20/2022   K 3.4 (L) 07/20/2022   CL 105 07/20/2022   CO2 24 07/20/2022    Lab Results  Component Value Date   CHOL 153 07/20/2022   HDL 64 07/20/2022   LDLCALC 65 07/20/2022   TRIG 138 07/20/2022   CHOLHDL 2.4 07/20/2022    Medications Reviewed Today     Reviewed by Brooke Mcdowell, RPH (Pharmacist) on 05/07/24 at 1140  Med List Status: <None>   Medication Order Taking? Sig Documenting Provider Last Dose Status Informant  albuterol  (PROVENTIL ) (2.5 MG/3ML) 0.083%  nebulizer solution [id number] Yes Inhale into the lungs. [provider]  Active   diazepam (VALIUM) 5 MG tablet [id number] Yes Take 5 mg by mouth 2 (two) times daily as needed. [provider]  Active   DULoxetine  (CYMBALTA ) 20 MG capsule [id number] Yes Take 1 capsule (20 mg total) by mouth daily. Ostwalt, Janna, PA-C  Active   EQ GENTLE LUBRICANT 0.3 % SOLN [id number] Yes Place 1 drop into both eyes at  bedtime as needed (dry/irritated eyes.). [provider]  Active Self  estradiol  (ESTRACE ) 1 MG tablet [id number] Yes TAKE 1 TABLET(1 MG) BY MOUTH DAILY Emilio Kelly DASEN, FNP  Active   fluticasone  (FLONASE ) 50 MCG/ACT nasal spray [id number] Yes Place 2 sprays into both nostrils daily. Emilio Kelly DASEN, FNP  Active            Med Note CATHY, PAULA   Wed Dec 20, 2023  2:06 PM) prn  Fluticasone -Umeclidin-Vilant (TRELEGY ELLIPTA ) 100-62.5-25 MCG/ACT AEPB [id number]  Inhale 1 puff into the lungs daily.  Patient not taking: Reported on 05/07/2024   Gareth Mliss FALCON, FNP  Active   Magnesium 250 MG TABS [id number] Yes Take 500 mg by mouth every evening.  [provider]  Active Self  medroxyPROGESTERone  (PROVERA ) 2.5 MG tablet [id number] Yes TAKE 1 TABLET(2.5 MG) BY MOUTH DAILY Emilio Kelly DASEN, FNP  Active   Multiple Vitamins-Minerals (ADULT GUMMY PO) [id number] Yes Take 2 tablets by mouth every evening. Vitafusion MultiVites Gummy [provider]  Active Self  NURTEC 75 MG TBDP [id number]  Take by mouth daily.  Patient not taking: Reported on 05/07/2024   [provider]  Active   omeprazole  (PRILOSEC) 40 MG capsule [id number] Yes Take 1 capsule (40 mg total) by mouth daily. Ostwalt, Janna, PA-C  Active   rosuvastatin  (CRESTOR ) 10 MG tablet [id number] Yes TAKE 1 TABLET(10 MG) BY MOUTH DAILY Ostwalt, Janna, PA-C  Active   sertraline  (ZOLOFT ) 25 MG tablet [id number] Yes Take 1 tablet (25 mg total) by mouth daily. Ostwalt, Janna, PA-C  Active   zolpidem  (AMBIEN ) 10 MG tablet [id number] Yes Take 1 tablet once daily Ostwalt, Janna, PA-C  Active               Assessment/Plan:   Patient does not have recent sodium levels prior to or following the initiation of Cymbalta , which was started in late June 2025. Given the patient is over 84 years old and has low body weight, it may be appropriate to assess sodium levels due to the risk of hyponatremia associated with duloxetine  in older  adults.  Plan:  Will defer Cymbalta  dose adjustment until sodium and renal function (Na, SCr) can be evaluated to ensure safety.   Ambien  5 mg is currently taken frequently instead of 10 mg daily. As this is a controlled substance, any adjustments will need to be made by the primary care provider (PCP). Encouraged patient to try to consistently take Ambien  5 mg daily and better sleep hygiene.      Follow Up Plan: 2 weeks   Dorcas Solian, PharmD Clinical Pharmacist Cell: [phone]

## 2024-05-15 ENCOUNTER — Other Ambulatory Visit: Payer: Self-pay | Admitting: Physician Assistant

## 2024-05-15 DIAGNOSIS — Z5189 Encounter for other specified aftercare: Secondary | ICD-10-CM | POA: Insufficient documentation

## 2024-05-15 DIAGNOSIS — Z79899 Other long term (current) drug therapy: Secondary | ICD-10-CM | POA: Insufficient documentation

## 2024-05-16 DIAGNOSIS — Z5189 Encounter for other specified aftercare: Secondary | ICD-10-CM | POA: Diagnosis not present

## 2024-05-17 ENCOUNTER — Ambulatory Visit: Payer: Self-pay | Admitting: Physician Assistant

## 2024-05-17 LAB — BASIC METABOLIC PANEL WITH GFR
BUN/Creatinine Ratio: 17 (ref 12–28)
BUN: 15 mg/dL (ref 8–27)
CO2: 21 mmol/L (ref 20–29)
Calcium: 9.8 mg/dL (ref 8.7–10.3)
Chloride: 104 mmol/L (ref 96–106)
Creatinine, Ser: 0.86 mg/dL (ref 0.57–1.00)
Glucose: 84 mg/dL (ref 70–99)
Potassium: 4.2 mmol/L (ref 3.5–5.2)
Sodium: 143 mmol/L (ref 134–144)
eGFR: 71 mL/min/1.73 (ref >59)

## 2024-05-21 ENCOUNTER — Other Ambulatory Visit: Payer: Self-pay

## 2024-05-21 DIAGNOSIS — M47816 Spondylosis without myelopathy or radiculopathy, lumbar region: Secondary | ICD-10-CM | POA: Diagnosis not present

## 2024-05-21 DIAGNOSIS — F339 Major depressive disorder, recurrent, unspecified: Secondary | ICD-10-CM

## 2024-05-21 NOTE — Progress Notes (Incomplete)
   05/21/2024 Name: Brooke Mcdowell MRN: [id number] DOB: [birth year]  Chief Complaint  Patient presents with   Medication Adherence    Brooke Mcdowell is a 73 y.o. year old female who presented for a telephone visit.   They were referred to the pharmacist by their PCP for assistance in managing complex medication management. Referral specifically noted  patient wants to decrease zoloft  and increase cymbalta  and possible decrease a dose of Ambien .   Subjective: Brooke Mcdowell is a 73 year old female with a PMH significant for insomnia, occipital neuralgia, anxiety, and depression. She believes that she does not have depression.  Care Team: Primary Care Provider: Ostwalt, Janna, PA-C ; Next Scheduled Visit: 05/30/2024   Medication Management:  Per records, Cymbalta  20 mg was last filled on 03/29/2024 for a 90-day supply. She reports that she is still tolerating Cymbalta  well.  She has been taking sertraline  12.5 mg daily since we last spoke and reports feeling well. She has tolerated this change and feels that she may be ready to stop sertraline  if the Cymbalta  dose is increased.  The patient has been regularly reducing her dose to Ambien  5 mg nightly but reports having taken 10 mg on two occasions in the past two weeks. Overall, she has been able to maintain longer stretches on 5 mg nightly since our last conversation. She would like to remain on Ambien  for now, as she feels she cannot get adequate rest without it.  Objective:  No results found for: HGBA1C  Lab Results  Component Value Date   CREATININE 0.86 05/16/2024   BUN 15 05/16/2024   NA 143 05/16/2024   K 4.2 05/16/2024   CL 104 05/16/2024   CO2 21 05/16/2024    Lab Results  Component Value Date   CHOL 153 07/20/2022   HDL 64 07/20/2022   LDLCALC 65 07/20/2022   TRIG 138 07/20/2022   CHOLHDL 2.4 07/20/2022    Medications Reviewed Today   Medications were not reviewed in this encounter       Assessment/Plan:    Patient's creatinine clearance 43.3 mL/min, sodium is WNL.   Plan:  Sertraline : Discontinue sertraline  12.5 mg daily.  Will collaborate with PCP.   Cymbalta : Increase Cymbalta  to 40 mg daily. Will collaborate with PCP regarding dose adjustment.  Ambien : Continue Ambien  5 mg nightly. As the patient is not yet ready to discontinue, future management will be deferred to PCP.   Follow Up Plan: 4 weeks   Dorcas Solian, PharmD Clinical Pharmacist Cell: [phone]

## 2024-05-27 ENCOUNTER — Other Ambulatory Visit: Payer: Self-pay | Admitting: Physician Assistant

## 2024-05-27 DIAGNOSIS — G4700 Insomnia, unspecified: Secondary | ICD-10-CM

## 2024-05-30 ENCOUNTER — Encounter: Payer: Self-pay | Admitting: Physician Assistant

## 2024-05-30 ENCOUNTER — Ambulatory Visit (INDEPENDENT_AMBULATORY_CARE_PROVIDER_SITE_OTHER): Admitting: Physician Assistant

## 2024-05-30 VITALS — BP 119/71 | HR 87 | Temp 98.6°F | Ht 61.0 in | Wt 105.3 lb

## 2024-05-30 DIAGNOSIS — F419 Anxiety disorder, unspecified: Secondary | ICD-10-CM

## 2024-05-30 DIAGNOSIS — F339 Major depressive disorder, recurrent, unspecified: Secondary | ICD-10-CM | POA: Diagnosis not present

## 2024-05-30 DIAGNOSIS — M5481 Occipital neuralgia: Secondary | ICD-10-CM | POA: Diagnosis not present

## 2024-05-30 DIAGNOSIS — G4700 Insomnia, unspecified: Secondary | ICD-10-CM

## 2024-05-30 MED ORDER — DULOXETINE HCL 20 MG PO CPEP: 40.0000 mg | ORAL_CAPSULE | Freq: Every day | ORAL | 1 refills | Status: AC

## 2024-05-30 NOTE — Progress Notes (Signed)
 Established patient visit  Patient: Brooke Mcdowell   DOB: May 08, 1951   73 y.o. Female  MRN: [id number] Visit Date: 05/30/2024  Today's healthcare provider: Jolynn Spencer, PA-C   Chief Complaint  Patient presents with   Medical Management of Chronic Issues    Patient reports feeling well, reports no acute issues.  Patient reports that she has been doing 20 mg of cymbalta .    Subjective     HPI     Medical Management of Chronic Issues    Additional comments: Patient reports feeling well, reports no acute issues.  Patient reports that she has been doing 20 mg of cymbalta .       Last edited by Cherry Chiquita HERO, CMA on 05/30/2024  1:06 PM.       Discussed the use of AI scribe software for clinical note transcription with the patient, who gave verbal consent to proceed.  History of Present Illness Brooke Mcdowell is a 73 year old female who presents for medication management and follow-up.  She is currently taking Cymbalta  20 mg twice daily, which effectively manages her neck and eye nerve pain. She prefers to maintain the lowest effective dose to avoid withdrawal issues. She has discontinued Zoloft  after a gradual tapering process. For sleep, she takes zolpidem  5 mg, which she finds effective without adverse behaviors such as sleepwalking or driving. She has experienced drowsiness with trazodone and is cautious about medication use due to concerns about cognitive decline.       05/30/2024    1:29 PM 04/29/2024    9:56 AM 03/29/2024    9:44 AM  Depression screen PHQ 2/9  Decreased Interest 0 1 0  Down, Depressed, Hopeless 0 0 0  PHQ - 2 Score 0 1 0  Altered sleeping 2 1 0  Tired, decreased energy 1 1 0  Change in appetite 0 1 0  Feeling bad or failure about yourself  0 0 0  Trouble concentrating 0 0 0  Moving slowly or fidgety/restless 0 0 0  Suicidal thoughts 0 0 0  PHQ-9 Score 3 4 0  Difficult doing work/chores   Not difficult at all      05/30/2024    1:30 PM 04/29/2024     9:56 AM 03/29/2024    9:45 AM 07/25/2023    2:19 PM  GAD 7 : Generalized Anxiety Score  Nervous, Anxious, on Edge 1 1 0 0  Control/stop worrying 0 0 0 0  Worry too much - different things 0 0 0   Trouble relaxing 1 0 2   Restless 0 0 0   Easily annoyed or irritable 0 0 0   Afraid - awful might happen 0 0 0   Total GAD 7 Score 2 1 2    Anxiety Difficulty Not difficult at all Not difficult at all Not difficult at all Not difficult at all    Medications: Outpatient Medications Prior to Visit  Medication Sig   albuterol  (PROVENTIL ) (2.5 MG/3ML) 0.083% nebulizer solution Inhale into the lungs.   EQ GENTLE LUBRICANT 0.3 % SOLN Place 1 drop into both eyes at bedtime as needed (dry/irritated eyes.).   estradiol  (ESTRACE ) 1 MG tablet TAKE 1 TABLET(1 MG) BY MOUTH DAILY   fluticasone  (FLONASE ) 50 MCG/ACT nasal spray Place 2 sprays into both nostrils daily.   Magnesium 250 MG TABS Take 500 mg by mouth every evening.    medroxyPROGESTERone  (PROVERA ) 2.5 MG tablet TAKE 1 TABLET(2.5 MG) BY MOUTH DAILY   Multiple Vitamins-Minerals (  ADULT GUMMY PO) Take 2 tablets by mouth every evening. Vitafusion MultiVites Gummy   omeprazole  (PRILOSEC) 40 MG capsule Take 1 capsule (40 mg total) by mouth daily.   rosuvastatin  (CRESTOR ) 10 MG tablet TAKE 1 TABLET(10 MG) BY MOUTH DAILY   traMADol (ULTRAM) 50 MG tablet Take 50 mg by mouth 2 (two) times daily as needed.   zolpidem  (AMBIEN ) 10 MG tablet Take 1/2 tablet once daily up to 1 tablet as needed.   [DISCONTINUED] DULoxetine  (CYMBALTA ) 20 MG capsule Take 1 capsule (20 mg total) by mouth daily.   [DISCONTINUED] diazepam (VALIUM) 5 MG tablet Take 5 mg by mouth 2 (two) times daily as needed.   [DISCONTINUED] Fluticasone -Umeclidin-Vilant (TRELEGY ELLIPTA ) 100-62.5-25 MCG/ACT AEPB Inhale 1 puff into the lungs daily. (Patient not taking: Reported on 05/07/2024)   [DISCONTINUED] NURTEC 75 MG TBDP Take by mouth daily. (Patient not taking: Reported on 05/07/2024)   No  facility-administered medications prior to visit.    Review of Systems  All other systems reviewed and are negative.  All negative Except see HPI       Objective    BP 119/71 (Patient Position: Sitting, Cuff Size: Normal)   Pulse 87   Temp 98.6 F (37 C) (Oral)   Ht 5' 1 (1.549 m)   Wt 105 lb 4.8 oz (47.8 kg)   SpO2 100%   BMI 19.90 kg/m     Physical Exam Vitals reviewed.  Constitutional:      General: She is not in acute distress.    Appearance: Normal appearance. She is well-developed. She is not diaphoretic.  HENT:     Head: Normocephalic and atraumatic.  Eyes:     General: No scleral icterus.    Conjunctiva/sclera: Conjunctivae normal.  Neck:     Thyroid : No thyromegaly.  Cardiovascular:     Rate and Rhythm: Normal rate and regular rhythm.     Pulses: Normal pulses.     Heart sounds: Normal heart sounds. No murmur heard. Pulmonary:     Effort: Pulmonary effort is normal. No respiratory distress.     Breath sounds: Normal breath sounds. No wheezing, rhonchi or rales.  Musculoskeletal:     Cervical back: Neck supple.     Right lower leg: No edema.     Left lower leg: No edema.  Lymphadenopathy:     Cervical: No cervical adenopathy.  Skin:    General: Skin is warm and dry.     Findings: No rash.  Neurological:     Mental Status: She is alert and oriented to person, place, and time. Mental status is at baseline.  Psychiatric:        Mood and Affect: Mood normal.        Behavior: Behavior normal.      No results found for any visits on 05/30/24.      Assessment & Plan Insomnia Chronic insomnia managed with zolpidem  5 mg nightly. Concerns about cognitive decline with long-term use. Previous trazodone trials caused daytime drowsiness. Discussed dose reduction and alternatives. - Continue zolpidem  5 mg nightly. - Consider gradual dose reduction. - Discuss potential retry of trazodone if zolpidem  reduction is successful.  Depression and Anxiety  Disorder Depression and anxiety managed with Cymbalta  20 mg daily, providing good symptom control. Concerns about tapering and withdrawal effects. Prefers lowest effective dose. Clinic pharmacist involved in management. - Continue Cymbalta  20 mg daily. - Prescribe Cymbalta  with supply for up to 40 mg if needed. - Monitor symptoms and adjust dosage as  necessary. - Reassess medication regimen during physical exam in November or December.   Depression, recurrent (HCC) Anxiety - DULoxetine  (CYMBALTA ) 20 MG capsule; Take 2 capsules (40 mg total) by mouth daily.  Dispense: 180 capsule; Refill: 1  Insomnia, unspecified type Continue zolpidem  temporarily while taking 0.5 mg daily . Will reassess at the follow-up  No orders of the defined types were placed in this encounter.   Return in about 3 months (around 08/30/2024) for chronic disease f/u.   The patient was advised to call back or seek an in-person evaluation if the symptoms worsen or if the condition fails to improve as anticipated.  I discussed the assessment and treatment plan with the patient. The patient was provided an opportunity to ask questions and all were answered. The patient agreed with the plan and demonstrated an understanding of the instructions.  I, Courtney Fenlon, PA-C have reviewed all documentation for this visit. The documentation on 05/30/2024  for the exam, diagnosis, procedures, and orders are all accurate and complete.  Jolynn Spencer, [facility], MMS [place] [phone] (phone) [phone] (fax)  [facility]

## 2024-06-04 DIAGNOSIS — M47816 Spondylosis without myelopathy or radiculopathy, lumbar region: Secondary | ICD-10-CM | POA: Diagnosis not present

## 2024-06-07 ENCOUNTER — Other Ambulatory Visit: Payer: Self-pay | Admitting: Physician Assistant

## 2024-06-07 ENCOUNTER — Ambulatory Visit (INDEPENDENT_AMBULATORY_CARE_PROVIDER_SITE_OTHER): Admitting: Physician Assistant

## 2024-06-07 ENCOUNTER — Encounter: Payer: Self-pay | Admitting: Physician Assistant

## 2024-06-07 VITALS — BP 119/75 | HR 72 | Temp 98.8°F

## 2024-06-07 DIAGNOSIS — R309 Painful micturition, unspecified: Secondary | ICD-10-CM

## 2024-06-07 DIAGNOSIS — R3989 Other symptoms and signs involving the genitourinary system: Secondary | ICD-10-CM | POA: Diagnosis not present

## 2024-06-07 DIAGNOSIS — F419 Anxiety disorder, unspecified: Secondary | ICD-10-CM

## 2024-06-07 MED ORDER — CEPHALEXIN 500 MG PO CAPS: 500.0000 mg | ORAL_CAPSULE | Freq: Two times a day (BID) | ORAL | 0 refills | Status: DC

## 2024-06-07 NOTE — Progress Notes (Signed)
   06/07/2024 Name: Brooke Mcdowell MRN: [id number] DOB: 1951/08/31  Update: Provider sent a separate prescription for Duloxetine  and will close note.   Thanks,  Dorcas Solian, PharmD Clinical Pharmacist Cell: [phone]

## 2024-06-07 NOTE — Patient Instructions (Signed)

## 2024-06-07 NOTE — Progress Notes (Signed)
 Established patient visit  Patient: Brooke Mcdowell   DOB: March 14, 1951   73 y.o. Female  MRN: [id number] Visit Date: 06/07/2024  Today's healthcare provider: Jolynn Spencer, PA-C   Chief Complaint  Patient presents with   Urinary Tract Infection    She reports new onset dysuria, flank pain, hematuria, urinary frequency, and urinary hesitancy. The current episode started  3 days ago and is worsening. Patient states symptoms are severe and 8/10 in intensity, occurring constantly. She  has not been recently treated for similar symptoms.  Associated symptoms:chills, constipation, low grade fever, hematuria,  New Medication : cymbalta   Treatment: Azo   Subjective     HPI     Urinary Tract Infection    Additional comments: She reports new onset dysuria, flank pain, hematuria, urinary frequency, and urinary hesitancy. The current episode started  3 days ago and is worsening. Patient states symptoms are severe and 8/10 in intensity, occurring constantly. She  has not been recently treated for similar symptoms.  Associated symptoms:chills, constipation, low grade fever, hematuria,  New Medication : cymbalta   Treatment: Azo      Last edited by Lilian Fitzpatrick, CMA on 06/07/2024  3:12 PM.       Discussed the use of AI scribe software for clinical note transcription with the patient, who gave verbal consent to proceed.  History of Present Illness Brooke Mcdowell is a 73 year old female who presents with urinary retention and suspected UTI.  She experiences severe groin pain, described as a twisting sensation, which worsens during urination. She observes blood clots and orange discoloration in her urine. She has taken AZO for symptom relief.  She has significant difficulty urinating, requiring effort to void, and sometimes cannot urinate even after prolonged attempts. She associates some symptoms with Cymbalta , which she takes for neurological pain. These issues began approximately one week  ago.  She has a history of UTIs, with the last episode over a year ago, treated successfully with Keflex . She is resistant to certain antibiotics, including sulfa drugs and tetracycline, due to allergies.  She acknowledges inadequate water intake, which she believes may contribute to her symptoms. She is currently on Cymbalta  20 mg for neurological pain.       05/30/2024    1:29 PM 04/29/2024    9:56 AM 03/29/2024    9:44 AM  Depression screen PHQ 2/9  Decreased Interest 0 1 0  Down, Depressed, Hopeless 0 0 0  PHQ - 2 Score 0 1 0  Altered sleeping 2 1 0  Tired, decreased energy 1 1 0  Change in appetite 0 1 0  Feeling bad or failure about yourself  0 0 0  Trouble concentrating 0 0 0  Moving slowly or fidgety/restless 0 0 0  Suicidal thoughts 0 0 0  PHQ-9 Score 3 4 0  Difficult doing work/chores   Not difficult at all      05/30/2024    1:30 PM 04/29/2024    9:56 AM 03/29/2024    9:45 AM 07/25/2023    2:19 PM  GAD 7 : Generalized Anxiety Score  Nervous, Anxious, on Edge 1 1 0 0  Control/stop worrying 0 0 0 0  Worry too much - different things 0 0 0   Trouble relaxing 1 0 2   Restless 0 0 0   Easily annoyed or irritable 0 0 0   Afraid - awful might happen 0 0 0   Total GAD 7 Score 2 1 2  Anxiety Difficulty Not difficult at all Not difficult at all Not difficult at all Not difficult at all    Medications: Outpatient Medications Prior to Visit  Medication Sig   albuterol  (PROVENTIL ) (2.5 MG/3ML) 0.083% nebulizer solution Inhale into the lungs.   DULoxetine  (CYMBALTA ) 20 MG capsule Take 2 capsules (40 mg total) by mouth daily.   EQ GENTLE LUBRICANT 0.3 % SOLN Place 1 drop into both eyes at bedtime as needed (dry/irritated eyes.).   estradiol  (ESTRACE ) 1 MG tablet TAKE 1 TABLET(1 MG) BY MOUTH DAILY   fluticasone  (FLONASE ) 50 MCG/ACT nasal spray Place 2 sprays into both nostrils daily.   Magnesium 250 MG TABS Take 500 mg by mouth every evening.    medroxyPROGESTERone   (PROVERA ) 2.5 MG tablet TAKE 1 TABLET(2.5 MG) BY MOUTH DAILY   Multiple Vitamins-Minerals (ADULT GUMMY PO) Take 2 tablets by mouth every evening. Vitafusion MultiVites Gummy   omeprazole  (PRILOSEC) 40 MG capsule Take 1 capsule (40 mg total) by mouth daily.   rosuvastatin  (CRESTOR ) 10 MG tablet TAKE 1 TABLET(10 MG) BY MOUTH DAILY   traMADol (ULTRAM) 50 MG tablet Take 50 mg by mouth 2 (two) times daily as needed.   zolpidem  (AMBIEN ) 10 MG tablet Take 1/2 tablet once daily up to 1 tablet as needed.   No facility-administered medications prior to visit.    Review of Systems All negative Except see HPI       Objective    There were no vitals taken for this visit.    Physical Exam Constitutional:      General: She is not in acute distress.    Appearance: Normal appearance.  HENT:     Head: Normocephalic.  Pulmonary:     Effort: Pulmonary effort is normal. No respiratory distress.  Neurological:     Mental Status: She is alert and oriented to person, place, and time. Mental status is at baseline.      No results found for any visits on 06/07/24.      Assessment & Plan Urinary tract infection with urinary retention and hematuria Acute UTI with urinary retention and hematuria. Cymbalta  may contribute to retention. Concern for antibiotic resistance due to past sulfa and tetracycline use. AZO may interfere with culture results. Empiric antibiotics prescribed due to severe symptoms. - Prescribed Keflex  for symptomatic relief. - Encouraged increased fluid intake including water, green tea, and soup. - Advised probiotics to support gut flora during antibiotics. - Instructed to stop AZO to avoid culture interference. - Sent urine culture to identify organism and guide therapy.  Suspected UTI (Primary) Pain with urination - POCT urinalysis dipstick - Urine Culture - cephALEXin  (KEFLEX ) 500 MG capsule; Take 1 capsule (500 mg total) by mouth 2 (two) times daily.  Dispense: 14  capsule; Refill: 0   Orders Placed This Encounter  Procedures   POCT urinalysis dipstick    No follow-ups on file.   The patient was advised to call back or seek an in-person evaluation if the symptoms worsen or if the condition fails to improve as anticipated.  I discussed the assessment and treatment plan with the patient. The patient was provided an opportunity to ask questions and all were answered. The patient agreed with the plan and demonstrated an understanding of the instructions.  I, Shahara Hartsfield, PA-C have reviewed all documentation for this visit. The documentation on 06/07/2024  for the exam, diagnosis, procedures, and orders are all accurate and complete.  Jolynn Spencer, [facility], MMS [facility] [phone] (phone) [phone] (fax)  [facility]

## 2024-06-10 ENCOUNTER — Ambulatory Visit: Payer: Self-pay | Admitting: Physician Assistant

## 2024-06-10 NOTE — Progress Notes (Signed)
 Please let the patient know that she has a gram-negative rods But this is preliminary result.  For now, please continue your current antibiotic

## 2024-06-11 LAB — URINE CULTURE

## 2024-06-19 ENCOUNTER — Other Ambulatory Visit

## 2024-06-20 DIAGNOSIS — M47816 Spondylosis without myelopathy or radiculopathy, lumbar region: Secondary | ICD-10-CM | POA: Diagnosis not present

## 2024-06-27 ENCOUNTER — Other Ambulatory Visit: Payer: Self-pay | Admitting: Physician Assistant

## 2024-06-27 DIAGNOSIS — G4700 Insomnia, unspecified: Secondary | ICD-10-CM

## 2024-07-24 ENCOUNTER — Ambulatory Visit: Admitting: Family Medicine

## 2024-07-24 ENCOUNTER — Ambulatory Visit: Payer: Self-pay | Admitting: *Deleted

## 2024-07-24 VITALS — BP 126/70 | HR 74 | Temp 98.9°F | Resp 16 | Ht 61.0 in | Wt 105.7 lb

## 2024-07-24 DIAGNOSIS — U071 COVID-19: Secondary | ICD-10-CM | POA: Diagnosis not present

## 2024-07-24 DIAGNOSIS — R051 Acute cough: Secondary | ICD-10-CM

## 2024-07-24 MED ORDER — HYDROCOD POLI-CHLORPHE POLI ER 10-8 MG/5ML PO SUER: 5.0000 mL | Freq: Two times a day (BID) | ORAL | 0 refills | Status: AC | PRN

## 2024-07-24 MED ORDER — NIRMATRELVIR/RITONAVIR (PAXLOVID)TABLET: 3.0000 | ORAL_TABLET | Freq: Two times a day (BID) | ORAL | 0 refills | Status: AC

## 2024-07-24 NOTE — Telephone Encounter (Signed)
 Patient seen today

## 2024-07-24 NOTE — Telephone Encounter (Signed)
 Appt scheduled today. Recommended if sx worsen go to ED. See NT encounter. Please advise if earlier appt available today.   FYI Only or Action Required?: FYI only for provider.  Patient was last seen in primary care on 06/07/2024 by Ostwalt, Janna, PA-C.  Called Nurse Triage reporting Cough.  Symptoms began several days ago.  Interventions attempted: OTC medications: cough medication and Rest, hydration, or home remedies.  Symptoms are: gradually worsening.  Triage Disposition: See HCP Within 4 Hours (Or PCP Triage)  Patient/caregiver understands and will follow disposition?: Yes                Copied from CRM #[id number]. Topic: Clinical - Red Word Triage >> Jul 24, 2024 11:32 AM Donna BRAVO wrote: Red Word that prompted transfer to Nurse Triage: patient said this started on Sunday 07/21/24 Symptoms: Coughing mucus that doesn't come all the way up Feels like she is drowning Bad headache, right side and back of head Sore throat Fever last night 100.0 Short of breath O2 level 99 Hart rate 72 Reason for Disposition  MILD difficulty breathing (e.g., minimal/no SOB at rest, SOB with walking, pulse < 100)  Answer Assessment - Initial Assessment Questions Appt scheduled today with other provider. None available with PCP until tomorrow.  Recommended if worsening sx of chest tightness, difficulty breathing at rest go to ED.  Patient requesting if she can reschedule or do physical today, that has been previously scheduled for tomorrow. Recommended patient will need to reschedule and see PCP and not another provider. Please advise      1. SYMPTOMS: What is your main symptom or concern? (e.g., cough, fever, shortness of breath, muscle aches)     Cough, SOB with exertion or cough , sore throat, headache glands swollen front of neck  2. ONSET: When did the symptoms start?      Sunday night  3. COUGH: Do you have a cough? If Yes, ask: How bad is the cough?       Yes  unable to cough up phlegm 4. FEVER: Do you have a fever? If Yes, ask: What is your temperature, how was it measured, and when did it start?     Last night fever 100 5. BREATHING DIFFICULTY: Are you having any difficulty breathing? (e.g., normal; shortness of breath, wheezing, unable to speak)      SOB with coughing , and exertion 6. BETTER-SAME-WORSE: Are you getting better, staying the same or getting worse compared to yesterday?  If getting worse, ask, In what way?     Worsening since Sunday  7. OTHER SYMPTOMS: Do you have any other symptoms?  (e.g., chills, fatigue, headache, loss of smell or taste, muscle pain, sore throat)     Headache , sore throat cough unable to cough up phlegm, SOB with exertion, fever yesterday. Feels like drowning chest tightness.  8. COVID-19 DIAGNOSIS: How do you know that you have COVID? (e.g., positive lab test or self-test, diagnosed by doctor or NP/PA, symptoms after exposure).     Positive covid at home test today  9. COVID-19 EXPOSURE: Was there any known exposure to COVID before the symptoms began?      na 10. COVID-19 VACCINE: Have you had the COVID-19 vaccine? If Yes, ask: When did you last get it?       na 11. HIGH RISK DISEASE: Do you have any chronic medical problems? (e.g., asthma, heart or lung disease, weak immune system, obesity, etc.)       Hx  upper resp. Issues. 12. PREGNANCY: Is there any chance you are pregnant? When was your last menstrual period?       na 13. O2 SATURATION MONITOR:  Do you use an oxygen saturation monitor (pulse oximeter) at home? If Yes, ask What is your reading (oxygen level) today? What is your usual oxygen saturation reading? (e.g., 95%)       99%  Protocols used: COVID-19 - Diagnosed or Suspected-A-AH

## 2024-07-24 NOTE — Progress Notes (Signed)
 Established patient visit   Patient: Brooke Mcdowell   DOB: 11-24-1950   73 y.o. Female  MRN: [id number] Visit Date: 07/24/2024  Today's healthcare provider: Nancyann Perry, MD   Chief Complaint  Patient presents with   Covid Positive    Covid test at home positive today.   Shortness of Breath    Sob with exertion and cough   Subjective    Discussed the use of AI scribe software for clinical note transcription with the patient, who gave verbal consent to proceed.  History of Present Illness   Brooke Mcdowell is a 73 year old female who presents with symptoms consistent with COVID-19.  Symptoms began on Sunday with a runny nose, initially without feeling unwell. She attended church and went out to eat but felt very tired by Sunday night. On Monday, she felt terrible and took a COVID test, which was positive. A subsequent test on Tuesday was negative, attributed to the tests being old. Symptoms progressed to include congestion and coughing.  She describes a 'strangest headache' that feels like 'somebody's zapping me right in front of my eye' with pain radiating downwards, occurring every minute or two. This prompted her to take another COVID test this morning, which was positive.  She feels the need to keep breathing but does not experience chest congestion or rattling. Home oxygen levels have been 98-99%. She has a nebulizer machine at home for albuterol  use.  She has a scratchy sore throat, swollen glands, and redness but denies having a 'razor throat'. She has not had COVID before and her last COVID vaccine was a couple of years ago.  She manages symptoms with Tylenol  for fever and salt water for her throat. She has not taken anything specifically for the cough but mentions using Desenex syrup in the past.  She has a history of kidney stones but no recent kidney problems. She is currently taking omeprazole  as needed and Crestor  for cholesterol management.        Medications: Outpatient Medications Prior to Visit  Medication Sig   albuterol  (PROVENTIL ) (2.5 MG/3ML) 0.083% nebulizer solution Inhale into the lungs.   DULoxetine  (CYMBALTA ) 20 MG capsule Take 2 capsules (40 mg total) by mouth daily.   EQ GENTLE LUBRICANT 0.3 % SOLN Place 1 drop into both eyes at bedtime as needed (dry/irritated eyes.).   estradiol  (ESTRACE ) 1 MG tablet TAKE 1 TABLET(1 MG) BY MOUTH DAILY   fluticasone  (FLONASE ) 50 MCG/ACT nasal spray Place 2 sprays into both nostrils daily.   Magnesium 250 MG TABS Take 500 mg by mouth every evening.    medroxyPROGESTERone  (PROVERA ) 2.5 MG tablet TAKE 1 TABLET(2.5 MG) BY MOUTH DAILY   Multiple Vitamins-Minerals (ADULT GUMMY PO) Take 2 tablets by mouth every evening. Vitafusion MultiVites Gummy   omeprazole  (PRILOSEC) 40 MG capsule Take 1 capsule (40 mg total) by mouth daily.   rosuvastatin  (CRESTOR ) 10 MG tablet TAKE 1 TABLET(10 MG) BY MOUTH DAILY   traMADol (ULTRAM) 50 MG tablet Take 50 mg by mouth 2 (two) times daily as needed.   zolpidem  (AMBIEN ) 10 MG tablet TAKE 0.5 TO 1 TABLET BY MOUTH ONCE DAILY AS NEEDED   No facility-administered medications prior to visit.   Review of Systems  Constitutional:  Negative for appetite change, chills, fatigue and fever.  HENT:  Positive for congestion and sore throat. Negative for ear pain, facial swelling, sinus pressure, sinus pain and sneezing.   Respiratory:  Positive for cough. Negative for  chest tightness and shortness of breath.   Cardiovascular:  Negative for chest pain and palpitations.  Gastrointestinal:  Negative for abdominal pain, nausea and vomiting.  Neurological:  Negative for dizziness and weakness.       Objective    BP 126/70 (BP Location: Left Arm, Patient Position: Sitting, Cuff Size: Normal)   Pulse 74   Temp 98.9 F (37.2 C) (Oral)   Resp 16   Ht 5' 1 (1.549 m)   Wt 105 lb 11.2 oz (47.9 kg)   SpO2 97%   BMI 19.97 kg/m   Physical Exam   General:  Appearance:    Thin female in no acute distress  Eyes:    PERRL, conjunctiva/corneas clear, EOM's intact       Lungs:     Clear to auscultation bilaterally, respirations unlabored  Heart:    Normal heart rate. Normal rhythm. No murmurs, rubs, or gallops.    MS:   All extremities are intact.    Neurologic:   Awake, alert, oriented x 3. No apparent focal neurological defect.         Assessment & Plan    1. COVID-19 (Primary)  - nirmatrelvir/ritonavir (PAXLOVID) 20 x 150 MG & 10 x 100MG  TABS; Take 3 tablets by mouth 2 (two) times daily for 5 days. (Take nirmatrelvir 150 mg two tablets twice daily for 5 days and ritonavir 100 mg one tablet twice daily for 5 days) Patient GFR is 71. STOP ROSUVASTATIN  FOR 10 DAYS  Dispense: 30 tablet; Refill: 0  2. Acute cough  - chlorpheniramine-HYDROcodone  (TUSSIONEX) 10-8 MG/5ML; Take 5 mLs by mouth every 12 (twelve) hours as needed for up to 7 days for cough.  Dispense: 70 mL; Refill: 0       Nancyann Perry, MD  [facility] [phone] (phone) [phone] (fax)  [facility]

## 2024-07-25 ENCOUNTER — Encounter: Payer: Self-pay | Admitting: Physician Assistant

## 2024-07-29 ENCOUNTER — Other Ambulatory Visit: Payer: Self-pay | Admitting: Physician Assistant

## 2024-07-29 DIAGNOSIS — G4700 Insomnia, unspecified: Secondary | ICD-10-CM

## 2024-07-30 ENCOUNTER — Telehealth: Payer: Self-pay | Admitting: Physician Assistant

## 2024-07-30 ENCOUNTER — Other Ambulatory Visit: Payer: Self-pay

## 2024-07-30 DIAGNOSIS — G4700 Insomnia, unspecified: Secondary | ICD-10-CM

## 2024-07-30 NOTE — Telephone Encounter (Signed)
 LOV- 05/30/2024 NOV- 08/16/2024 LRF- 07/01/2024  Disp Refills Start End   zolpidem  (AMBIEN ) 10 MG tablet 20 tablet 0 07/01/2024 --   Sig: TAKE 0.5 TO 1 TABLET BY MOUTH ONCE DAILY AS NEEDED   Sent to pharmacy as: zolpidem  (AMBIEN ) 10 MG tablet   E-Prescribing Status: Receipt confirmed by pharmacy (07/01/2024  6:05 AM EDT)

## 2024-07-31 MED ORDER — ZOLPIDEM TARTRATE 10 MG PO TABS: ORAL_TABLET | ORAL | 0 refills | Status: DC

## 2024-08-05 DIAGNOSIS — M48062 Spinal stenosis, lumbar region with neurogenic claudication: Secondary | ICD-10-CM | POA: Diagnosis not present

## 2024-08-05 DIAGNOSIS — M47816 Spondylosis without myelopathy or radiculopathy, lumbar region: Secondary | ICD-10-CM | POA: Diagnosis not present

## 2024-08-05 DIAGNOSIS — M5416 Radiculopathy, lumbar region: Secondary | ICD-10-CM | POA: Diagnosis not present

## 2024-08-16 ENCOUNTER — Ambulatory Visit: Admitting: Physician Assistant

## 2024-08-16 ENCOUNTER — Encounter: Payer: Self-pay | Admitting: Physician Assistant

## 2024-08-16 VITALS — BP 131/73 | HR 77 | Resp 16 | Ht 61.0 in | Wt 106.1 lb

## 2024-08-16 DIAGNOSIS — H6122 Impacted cerumen, left ear: Secondary | ICD-10-CM

## 2024-08-16 DIAGNOSIS — Z0000 Encounter for general adult medical examination without abnormal findings: Secondary | ICD-10-CM | POA: Diagnosis not present

## 2024-08-16 DIAGNOSIS — G4709 Other insomnia: Secondary | ICD-10-CM

## 2024-08-16 NOTE — Progress Notes (Signed)
 Complete physical exam  Patient: Brooke Mcdowell   DOB: 1951/09/03   73 y.o. Female  MRN: [id number] Visit Date: 08/16/2024  Today's healthcare provider: Jolynn Spencer, PA-C   Chief Complaint  Patient presents with   Annual Exam   Subjective    Brooke Mcdowell is a 73 y.o. female who presents today for a complete physical exam.   Discussed the use of AI scribe software for clinical note transcription with the patient, who gave verbal consent to proceed.  History of Present Illness Brooke Mcdowell is a 73 year old female who presents with dizziness and foggy-headedness following a recent COVID-19 infection.  She has experienced dizziness and a foggy-headed sensation for the past three to four weeks following a COVID-19 infection. Her ears feel slightly full, and she has some drainage. She denies feeling sick or having a sore throat.  She stopped her high cholesterol medication during her COVID-19 treatment with Paxlovid and has not resumed it. She is currently taking Cymbalta  and initially had difficulty with urination, which has resolved. She has reduced her Ambien  dose to 5 mg but finds it not always effective for sleep and is considering alternatives.  She exercises a couple of days a week for about 30 minutes each session and describes her diet as healthy. She sees her eye doctor annually and her dentist twice a year. She has no current issues with bowel movements or urination, although she sometimes skips a day. No problems with vaginal discharge.    Last depression screening scores    08/16/2024    1:32 PM 05/30/2024    1:29 PM 04/29/2024    9:56 AM  PHQ 2/9 Scores  PHQ - 2 Score 0 0 1  PHQ- 9 Score  3  4      Data saved with a previous flowsheet row definition   Last fall risk screening    05/30/2024    1:29 PM  Fall Risk   Falls in the past year? 0  Number falls in past yr: 0  Injury with Fall? 0  Risk for fall due to : No Fall Risks  Follow up Falls evaluation  completed   Last Audit-C alcohol use screening    07/24/2024   12:22 PM  Alcohol Use Disorder Test (AUDIT)  1. How often do you have a drink containing alcohol? 0  3. How often do you have six or more drinks on one occasion? 0   A score of 3 or more in women, and 4 or more in men indicates increased risk for alcohol abuse, EXCEPT if all of the points are from question 1   Past Medical History:  Diagnosis Date   Ankle pain, left    previous strain   Arthritis    hands and back   Arthropathy of hand    Dysthymic disorder    Esophageal reflux    History of kidney stones    Insomnia    Lumbar herniated disc    Gets injections in back 3 times a year   Migraine    almost evrery day   Osteoporosis    Panic disorder    Pure hypercholesterolemia    Vertigo    with sinus infection   Vitamin D deficiency    Past Surgical History:  Procedure Laterality Date   CATARACT EXTRACTION W/PHACO Right 12/06/2018   Procedure: CATARACT EXTRACTION PHACO AND INTRAOCULAR LENS PLACEMENT (IOC)-RIGHT;  Surgeon: Ferol Rogue, MD;  Location: [facility] OR[facility];  Service: Ophthalmology;  Laterality: Right;  US  01:28.9 CDE 11.98 Fluid Pack Lot # [id number]   CATARACT EXTRACTION W/PHACO Left 09/15/2020   Procedure: CATARACT EXTRACTION PHACO AND INTRAOCULAR LENS PLACEMENT (IOC) LEFT 8.63 00:52.2;  Surgeon: Jaye Fallow, MD;  Location: [facility];  Service: Ophthalmology;  Laterality: Left;   CESAREAN SECTION  1978   clot removal from right sinus after surgery to remove tumor in 1995     COLONOSCOPY WITH PROPOFOL  N/A 07/22/2021   Procedure: COLONOSCOPY WITH PROPOFOL ;  Surgeon: Therisa Bi, MD;  Location: [facility];  Service: Gastroenterology;  Laterality: N/A;   COSMETIC SURGERY     EYE SURGERY  2021   LITHOTRIPSY  [id number]   renal stones   TUBAL LIGATION     TUMOR REMOVAL     behind right ete in sinuses- 1995 Dr. Milissa   Social History   Socioeconomic History   Marital status:  Significant Other    Spouse name: Not on file   Number of children: 2   Years of education: Not on file   Highest education level: Associate degree: occupational, scientist, product/process development, or vocational program  Occupational History   Occupation: retired  Tobacco Use   Smoking status: Never   Smokeless tobacco: Never  Vaping Use   Vaping status: Never Used  Substance and Sexual Activity   Alcohol use: Yes    Comment: rarely - 1 drinks    Drug use: No   Sexual activity: Not on file  Other Topics Concern   Not on file  Social History Narrative   Lives with fiance   Social Drivers of Health   Financial Resource Strain: Low Risk  (07/24/2024)   Overall Financial Resource Strain (CARDIA)    Difficulty of Paying Living Expenses: Not hard at all  Food Insecurity: No Food Insecurity (07/24/2024)   Hunger Vital Sign    Worried About Running Out of Food in the Last Year: Never true    Ran Out of Food in the Last Year: Never true  Transportation Needs: No Transportation Needs (07/24/2024)   PRAPARE - Administrator, Civil Service (Medical): No    Lack of Transportation (Non-Medical): No  Physical Activity: Insufficiently Active (07/24/2024)   Exercise Vital Sign    Days of Exercise per Week: 3 days    Minutes of Exercise per Session: 30 min  Stress: No Stress Concern Present (07/24/2024)   [employer] of Occupational Health - Occupational Stress Questionnaire    Feeling of Stress: Not at all  Social Connections: Socially Integrated (07/24/2024)   Social Connection and Isolation Panel    Frequency of Communication with Friends and Family: More than three times a week    Frequency of Social Gatherings with Friends and Family: Once a week    Attends Religious Services: More than 4 times per year    Active Member of [employer] or Organizations: Yes    Attends Banker Meetings: More than 4 times per year    Marital Status: Living with partner  Intimate Partner Violence:  Not At Risk (12/20/2023)   Humiliation, Afraid, Rape, and Kick questionnaire    Fear of Current or Ex-Partner: No    Emotionally Abused: No    Physically Abused: No    Sexually Abused: No   Family Status  Relation Name Status   Mother Hargis Deceased   Father Lynwood Deceased   Sister  Alive   Brother half Alive  No partnership data on file   Family  History  Problem Relation Age of Onset   Brain cancer Mother    Cancer Mother        lung   Arthritis Mother    Cancer Father        lung    Brain cancer Father    Allergies  Allergen Reactions   Gabapentin Other (See Comments)    Caused migraines  gabapentin   Latex Itching, Other (See Comments) and Hives    Bandaids, redness  latex   Tetracyclines & Related Itching and Nausea And Vomiting   Sulfa Antibiotics Rash    Patient Care Team: Carlisle Enke, PA-C as PCP - General (Physician Assistant) Avanell Katz, MD as Referring Physician (Physical Medicine and Rehabilitation) Theotis Lavelle BRAVO, MD as Referring Physician (Pulmonary Disease) Maree Jannett POUR, MD as Consulting Physician (Neurology) Dasher, Alm LABOR, MD (Dermatology) Jaye Fallow, MD as Referring Physician (Ophthalmology) Ortho, [facility] (Orthopedic Surgery)   Medications: Outpatient Medications Prior to Visit  Medication Sig   albuterol  (PROVENTIL ) (2.5 MG/3ML) 0.083% nebulizer solution Inhale into the lungs.   DULoxetine  (CYMBALTA ) 20 MG capsule Take 2 capsules (40 mg total) by mouth daily.   EQ GENTLE LUBRICANT 0.3 % SOLN Place 1 drop into both eyes at bedtime as needed (dry/irritated eyes.).   estradiol  (ESTRACE ) 1 MG tablet TAKE 1 TABLET(1 MG) BY MOUTH DAILY   fluticasone  (FLONASE ) 50 MCG/ACT nasal spray Place 2 sprays into both nostrils daily.   Magnesium 250 MG TABS Take 500 mg by mouth every evening.    medroxyPROGESTERone  (PROVERA ) 2.5 MG tablet TAKE 1 TABLET(2.5 MG) BY MOUTH DAILY   Multiple Vitamins-Minerals (ADULT GUMMY PO) Take 2 tablets by  mouth every evening. Vitafusion MultiVites Gummy   omeprazole  (PRILOSEC) 40 MG capsule Take 1 capsule (40 mg total) by mouth daily.   rosuvastatin  (CRESTOR ) 10 MG tablet TAKE 1 TABLET(10 MG) BY MOUTH DAILY   traMADol (ULTRAM) 50 MG tablet Take 50 mg by mouth 2 (two) times daily as needed.   zolpidem  (AMBIEN ) 10 MG tablet TAKE 0.5 TO 1 TABLET BY MOUTH ONCE DAILY AS NEEDED   No facility-administered medications prior to visit.    Review of Systems  All other systems reviewed and are negative.  Except see HPI     Objective    BP 131/73   Pulse 77   Resp 16   Ht 5' 1 (1.549 m)   Wt 106 lb 1.6 oz (48.1 kg)   SpO2 98%   BMI 20.05 kg/m      Physical Exam Vitals reviewed.  Constitutional:      General: She is not in acute distress.    Appearance: Normal appearance. She is well-developed. She is not ill-appearing, toxic-appearing or diaphoretic.  HENT:     Head: Normocephalic and atraumatic.     Right Ear: Tympanic membrane, ear canal and external ear normal.     Left Ear: Tympanic membrane, ear canal and external ear normal.     Nose: Nose normal. No congestion or rhinorrhea.     Mouth/Throat:     Mouth: Mucous membranes are moist.     Pharynx: Oropharynx is clear. No oropharyngeal exudate.  Eyes:     General: No scleral icterus.       Right eye: No discharge.        Left eye: No discharge.     Conjunctiva/sclera: Conjunctivae normal.     Pupils: Pupils are equal, round, and reactive to light.  Neck:     Thyroid : No thyromegaly.  Vascular: No carotid bruit.  Cardiovascular:     Rate and Rhythm: Normal rate and regular rhythm.     Pulses: Normal pulses.     Heart sounds: Normal heart sounds. No murmur heard.    No friction rub. No gallop.  Pulmonary:     Effort: Pulmonary effort is normal. No respiratory distress.     Breath sounds: Normal breath sounds. No wheezing or rales.  Abdominal:     General: Abdomen is flat. Bowel sounds are normal. There is no  distension.     Palpations: Abdomen is soft. There is no mass.     Tenderness: There is no abdominal tenderness. There is no right CVA tenderness, left CVA tenderness, guarding or rebound.     Hernia: No hernia is present.  Musculoskeletal:        General: No swelling, tenderness, deformity or signs of injury. Normal range of motion.     Cervical back: Normal range of motion and neck supple. No rigidity or tenderness.     Right lower leg: No edema.     Left lower leg: No edema.  Lymphadenopathy:     Cervical: No cervical adenopathy.  Skin:    General: Skin is warm and dry.     Coloration: Skin is not jaundiced or pale.     Findings: No bruising, erythema, lesion or rash.  Neurological:     Mental Status: She is alert and oriented to person, place, and time. Mental status is at baseline.     Gait: Gait normal.  Psychiatric:        Mood and Affect: Mood normal.        Behavior: Behavior normal.        Thought Content: Thought content normal.        Judgment: Judgment normal.      No results found for any visits on 08/16/24.  Assessment & Plan    Routine Health Maintenance and Physical Exam  Exercise Activities and Dietary recommendations  Goals      DIET - INCREASE WATER INTAKE        Immunization History  Administered Date(s) Administered   Fluad Quad(high Dose 65+) 07/06/2020, 07/15/2021   Fluad Trivalent(High Dose 65+) 06/29/2018   INFLUENZA, HIGH DOSE SEASONAL PF 07/20/2017, 06/07/2019, 07/06/2023   Influenza-Unspecified 06/29/2018, 07/22/2022   Moderna Sars-Covid-2 Vaccination 11/14/2019, 12/12/2019, 09/21/2020   Pneumococcal Conjugate Pcv21, Polysaccharide Crm197 Conjugaf 04/26/2024   Pneumococcal Conjugate-13 06/13/2017   Pneumococcal Polysaccharide-23 07/03/2018   Tdap 03/17/2017   Zoster, Live 04/26/2013    Health Maintenance  Topic Date Due   Zoster (Shingles) Vaccine (1 of 2) 11/29/1968   Breast Cancer Screening  07/29/2023   COVID-19 Vaccine (4 -  2025-26 season) 06/03/2024   Flu Shot  12/31/2024*   Medicare Annual Wellness Visit  12/19/2024   DEXA scan (bone density measurement)  02/11/2027   DTaP/Tdap/Td vaccine (2 - Td or Tdap) 03/18/2027   Colon Cancer Screening  07/23/2031   Pneumococcal Vaccine for age over 38  Completed   Hepatitis C Screening  Completed   Meningitis B Vaccine  Aged Out  *Topic was postponed. The date shown is not the original due date.    Discussed health benefits of physical activity, and encouraged her to engage in regular exercise appropriate for her age and condition. Assessment & Plan Adult Wellness Visit Good health with regular exercise and diet. No significant bowel or urinary issues. Regular dental and eye exams. Concerns about cholesterol and insurance changes affecting  medication. - Continue current exercise and diet regimen. - Maintain regular dental and eye exams. - Schedule follow-up in six weeks for chronic condition management and blood work.  Things to do to keep yourself healthy  - Exercise at least 30-45 minutes a day, 3-4 days a week.  - Eat a low-fat diet with lots of fruits and vegetables, up to 7-9 servings per day.  - Seatbelts can save your life. Wear them always.  - Smoke detectors on every level of your home, check batteries every year.  - Eye Doctor - have an eye exam every 1-2 years  - Safe sex - if you may be exposed to STDs, use a condom.  - Alcohol -  If you drink, do it moderately, less than 2 drinks per day.  - Health Care Power of Attorney. Choose someone to speak for you if you are not able.  - Depression is common in our stressful world.If you're feeling down or losing interest in things you normally enjoy, please come in for a visit.  - Violence - If anyone is threatening or hurting you, please call immediately.  Insomnia Difficulty sleeping, using Ambien . Interested in safer alternative. Discussed new medication covered by insurance, safe and effective for  sleep. - Prescribed new sleep medication for 30 days. - Continue Ambien  as needed, with caution. Before starting ramelteon started to taper zolpidem : reduce the zolpidem  dose by 25% every 1 to 2 weeks until completely discontinued, while monitoring for withdrawal symptoms. Once zolpidem  is discontinued, initiate ramelteon 8mg  sleep onset/doxepin 3mg  sleep maintenance. Consider referral to collaborative care for safe transition.  Cerumen impaction/Ear symptoms (fullness, dizziness, fogginess) post-COVID Persistent ear fullness, dizziness, and fogginess post-COVID. Possible earwax impaction or post-viral syndrome. - Provided earwax softening sample and instructed on use. - Advised on warm salt gargles and warm tea with honey for symptom relief. Overall, symptomatic treatment for allergic rhinitis advised Will follow-up    No follow-ups on file.    The patient was advised to call back or seek an in-person evaluation if the symptoms worsen or if the condition fails to improve as anticipated.  I discussed the assessment and treatment plan with the patient. The patient was provided an opportunity to ask questions and all were answered. The patient agreed with the plan and demonstrated an understanding of the instructions.  I, Vivaan Helseth, PA-C have reviewed all documentation for this visit. The documentation on 08/16/2024  for the exam, diagnosis, procedures, and orders are all accurate and complete.  Jolynn Spencer, [facility], MMS [facility] [phone] (phone) [phone] (fax)  [facility]

## 2024-08-30 ENCOUNTER — Other Ambulatory Visit: Payer: Self-pay | Admitting: Physician Assistant

## 2024-08-30 DIAGNOSIS — G4700 Insomnia, unspecified: Secondary | ICD-10-CM

## 2024-09-02 ENCOUNTER — Ambulatory Visit: Admitting: Physician Assistant

## 2024-09-24 ENCOUNTER — Encounter: Payer: Self-pay | Admitting: Physician Assistant

## 2024-09-24 ENCOUNTER — Ambulatory Visit: Admitting: Physician Assistant

## 2024-09-24 VITALS — BP 138/81 | HR 69 | Resp 16 | Wt 107.2 lb

## 2024-09-24 DIAGNOSIS — M5481 Occipital neuralgia: Secondary | ICD-10-CM

## 2024-09-24 DIAGNOSIS — Z79890 Hormone replacement therapy (postmenopausal): Secondary | ICD-10-CM | POA: Diagnosis not present

## 2024-09-24 DIAGNOSIS — H6122 Impacted cerumen, left ear: Secondary | ICD-10-CM

## 2024-09-24 DIAGNOSIS — R309 Painful micturition, unspecified: Secondary | ICD-10-CM

## 2024-09-24 DIAGNOSIS — M5416 Radiculopathy, lumbar region: Secondary | ICD-10-CM

## 2024-09-24 DIAGNOSIS — F419 Anxiety disorder, unspecified: Secondary | ICD-10-CM

## 2024-09-24 DIAGNOSIS — E782 Mixed hyperlipidemia: Secondary | ICD-10-CM

## 2024-09-24 DIAGNOSIS — G4709 Other insomnia: Secondary | ICD-10-CM

## 2024-09-24 DIAGNOSIS — J4520 Mild intermittent asthma, uncomplicated: Secondary | ICD-10-CM

## 2024-09-24 DIAGNOSIS — K219 Gastro-esophageal reflux disease without esophagitis: Secondary | ICD-10-CM

## 2024-09-24 DIAGNOSIS — R030 Elevated blood-pressure reading, without diagnosis of hypertension: Secondary | ICD-10-CM | POA: Insufficient documentation

## 2024-09-24 DIAGNOSIS — Z780 Asymptomatic menopausal state: Secondary | ICD-10-CM | POA: Insufficient documentation

## 2024-09-24 DIAGNOSIS — G43809 Other migraine, not intractable, without status migrainosus: Secondary | ICD-10-CM

## 2024-09-24 MED ORDER — ESTRADIOL 0.5 MG PO TABS: 0.5000 mg | ORAL_TABLET | Freq: Every day | ORAL | 2 refills | Status: AC

## 2024-09-24 MED ORDER — MEDROXYPROGESTERONE ACETATE 2.5 MG PO TABS: 2.5000 mg | ORAL_TABLET | Freq: Every day | ORAL | 0 refills | Status: AC

## 2024-09-24 MED ORDER — ZOLPIDEM TARTRATE 10 MG PO TABS: ORAL_TABLET | ORAL | 0 refills | Status: DC
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
## 2024-10-21 ENCOUNTER — Other Ambulatory Visit: Payer: Self-pay | Admitting: Physician Assistant

## 2024-10-21 DIAGNOSIS — F419 Anxiety disorder, unspecified: Secondary | ICD-10-CM

## 2024-10-22 NOTE — Telephone Encounter (Signed)
 See Rx request

## 2024-11-25 ENCOUNTER — Ambulatory Visit: Admitting: Physician Assistant

## 2024-12-25 ENCOUNTER — Ambulatory Visit

## 2025-08-18 ENCOUNTER — Ambulatory Visit: Admitting: Physician Assistant
